# Patient Record
Sex: Male | Born: 1940 | Race: White | Hispanic: No | Marital: Married | Smoking: Former smoker
Health system: Southern US, Community
[De-identification: ages and names within clinical notes are randomized; demographics above are authoritative.]

## PROBLEM LIST (undated history)

## (undated) DIAGNOSIS — I639 Cerebral infarction, unspecified: Secondary | ICD-10-CM

## (undated) DIAGNOSIS — E785 Hyperlipidemia, unspecified: Secondary | ICD-10-CM

## (undated) DIAGNOSIS — I10 Essential (primary) hypertension: Secondary | ICD-10-CM

## (undated) DIAGNOSIS — D510 Vitamin B12 deficiency anemia due to intrinsic factor deficiency: Secondary | ICD-10-CM

## (undated) DIAGNOSIS — I519 Heart disease, unspecified: Secondary | ICD-10-CM

## (undated) DIAGNOSIS — I219 Acute myocardial infarction, unspecified: Secondary | ICD-10-CM

## (undated) DIAGNOSIS — C4490 Unspecified malignant neoplasm of skin, unspecified: Secondary | ICD-10-CM

## (undated) DIAGNOSIS — IMO0001 Reserved for inherently not codable concepts without codable children: Secondary | ICD-10-CM

## (undated) DIAGNOSIS — C61 Malignant neoplasm of prostate: Secondary | ICD-10-CM

## (undated) DIAGNOSIS — Z87442 Personal history of urinary calculi: Secondary | ICD-10-CM

## (undated) DIAGNOSIS — Z5189 Encounter for other specified aftercare: Secondary | ICD-10-CM

## (undated) DIAGNOSIS — E0590 Thyrotoxicosis, unspecified without thyrotoxic crisis or storm: Secondary | ICD-10-CM

## (undated) DIAGNOSIS — H3530 Unspecified macular degeneration: Secondary | ICD-10-CM

## (undated) DIAGNOSIS — K922 Gastrointestinal hemorrhage, unspecified: Secondary | ICD-10-CM

## (undated) DIAGNOSIS — Z951 Presence of aortocoronary bypass graft: Secondary | ICD-10-CM

## (undated) DIAGNOSIS — M1990 Unspecified osteoarthritis, unspecified site: Secondary | ICD-10-CM

## (undated) HISTORY — PX: KNEE ARTHROSCOPY: SHX127

## (undated) HISTORY — DX: Unspecified malignant neoplasm of skin, unspecified: C44.90

## (undated) HISTORY — DX: Unspecified macular degeneration: H35.30

## (undated) HISTORY — DX: Heart disease, unspecified: I51.9

## (undated) HISTORY — PX: GASTRIC RESECTION: SHX5248

## (undated) HISTORY — PX: OTHER SURGICAL HISTORY: SHX169

---

## 1985-08-31 DIAGNOSIS — K922 Gastrointestinal hemorrhage, unspecified: Secondary | ICD-10-CM

## 1985-08-31 HISTORY — DX: Gastrointestinal hemorrhage, unspecified: K92.2

## 2006-02-28 HISTORY — PX: CORONARY ARTERY BYPASS GRAFT: SHX141

## 2006-03-29 ENCOUNTER — Inpatient Hospital Stay (HOSPITAL_COMMUNITY): Admission: EM | Admit: 2006-03-29 | Discharge: 2006-04-05 | Payer: Self-pay | Admitting: Emergency Medicine

## 2006-03-29 ENCOUNTER — Encounter: Payer: Self-pay | Admitting: Vascular Surgery

## 2006-03-31 DIAGNOSIS — I219 Acute myocardial infarction, unspecified: Secondary | ICD-10-CM

## 2006-03-31 HISTORY — DX: Acute myocardial infarction, unspecified: I21.9

## 2006-04-22 ENCOUNTER — Encounter: Admission: RE | Admit: 2006-04-22 | Discharge: 2006-04-22 | Payer: Self-pay | Admitting: Cardiothoracic Surgery

## 2006-04-29 ENCOUNTER — Encounter (HOSPITAL_COMMUNITY): Admission: RE | Admit: 2006-04-29 | Discharge: 2006-07-28 | Payer: Self-pay | Admitting: *Deleted

## 2006-06-14 ENCOUNTER — Ambulatory Visit: Payer: Self-pay | Admitting: Internal Medicine

## 2006-07-01 LAB — CBC WITH DIFFERENTIAL/PLATELET
BASO%: 0.3 % (ref 0.0–2.0)
Eosinophils Absolute: 0.6 10*3/uL — ABNORMAL HIGH (ref 0.0–0.5)
HCT: 32.8 % — ABNORMAL LOW (ref 38.7–49.9)
LYMPH%: 17.5 % (ref 14.0–48.0)
MCHC: 33.7 g/dL (ref 32.0–35.9)
MONO#: 0.3 10*3/uL (ref 0.1–0.9)
NEUT%: 58.1 % (ref 40.0–75.0)
Platelets: 286 10*3/uL (ref 145–400)
WBC: 3.7 10*3/uL — ABNORMAL LOW (ref 4.0–10.0)

## 2006-07-02 ENCOUNTER — Encounter (INDEPENDENT_AMBULATORY_CARE_PROVIDER_SITE_OTHER): Payer: Self-pay | Admitting: Specialist

## 2006-07-02 ENCOUNTER — Other Ambulatory Visit: Admission: RE | Admit: 2006-07-02 | Discharge: 2006-07-02 | Payer: Self-pay | Admitting: Internal Medicine

## 2006-07-05 LAB — PROTEIN ELECTROPHORESIS, SERUM
Albumin ELP: 60 % (ref 55.8–66.1)
Alpha-1-Globulin: 3.8 % (ref 2.9–4.9)
Total Protein, Serum Electrophoresis: 7.1 g/dL (ref 6.0–8.3)

## 2006-07-05 LAB — COMPREHENSIVE METABOLIC PANEL
CO2: 25 mEq/L (ref 19–32)
Creatinine, Ser: 1.13 mg/dL (ref 0.40–1.50)
Glucose, Bld: 86 mg/dL (ref 70–99)
Total Bilirubin: 0.5 mg/dL (ref 0.3–1.2)

## 2006-07-05 LAB — IRON AND TIBC
TIBC: 240 ug/dL (ref 215–435)
UIBC: 163 ug/dL

## 2006-07-05 LAB — LACTATE DEHYDROGENASE: LDH: 183 U/L (ref 94–250)

## 2006-07-05 LAB — FERRITIN: Ferritin: 73 ng/mL (ref 22–322)

## 2006-07-05 LAB — FOLATE RBC: RBC Folate: 640 ng/mL — ABNORMAL HIGH (ref 180–600)

## 2006-07-20 LAB — CBC WITH DIFFERENTIAL/PLATELET
BASO%: 0.4 % (ref 0.0–2.0)
MCHC: 34.7 g/dL (ref 32.0–35.9)
MONO#: 0.4 10*3/uL (ref 0.1–0.9)
NEUT#: 1.5 10*3/uL (ref 1.5–6.5)
RBC: 2.78 10*6/uL — ABNORMAL LOW (ref 4.20–5.71)
RDW: 15.6 % — ABNORMAL HIGH (ref 11.2–14.6)
WBC: 3.1 10*3/uL — ABNORMAL LOW (ref 4.0–10.0)
lymph#: 0.7 10*3/uL — ABNORMAL LOW (ref 0.9–3.3)

## 2008-03-23 ENCOUNTER — Emergency Department (HOSPITAL_COMMUNITY): Admission: EM | Admit: 2008-03-23 | Discharge: 2008-03-23 | Payer: Self-pay | Admitting: Emergency Medicine

## 2009-12-26 ENCOUNTER — Ambulatory Visit (HOSPITAL_COMMUNITY): Admission: RE | Admit: 2009-12-26 | Discharge: 2009-12-26 | Payer: Self-pay | Admitting: Urology

## 2010-09-12 ENCOUNTER — Ambulatory Visit: Payer: Self-pay | Admitting: Hematology & Oncology

## 2010-09-15 LAB — CBC WITH DIFFERENTIAL (CANCER CENTER ONLY)
BASO#: 0 10*3/uL (ref 0.0–0.2)
BASO%: 0.4 % (ref 0.0–2.0)
EOS%: 23.3 % — ABNORMAL HIGH (ref 0.0–7.0)
Eosinophils Absolute: 1.3 10*3/uL — ABNORMAL HIGH (ref 0.0–0.5)
HCT: 38.9 % (ref 38.7–49.9)
HGB: 13.1 g/dL (ref 13.0–17.1)
LYMPH#: 1.3 10*3/uL (ref 0.9–3.3)
LYMPH%: 22 % (ref 14.0–48.0)
MCH: 33.6 pg — ABNORMAL HIGH (ref 28.0–33.4)
MCHC: 33.6 g/dL (ref 32.0–35.9)
MCV: 100 fL — ABNORMAL HIGH (ref 82–98)
MONO#: 0.4 10*3/uL (ref 0.1–0.9)
MONO%: 6.8 % (ref 0.0–13.0)
NEUT#: 2.7 10*3/uL (ref 1.5–6.5)
NEUT%: 47.5 % (ref 40.0–80.0)
Platelets: 244 10*3/uL (ref 145–400)
RBC: 3.88 10*6/uL — ABNORMAL LOW (ref 4.20–5.70)
RDW: 10.9 % (ref 10.5–14.6)
WBC: 5.7 10*3/uL (ref 4.0–10.0)

## 2010-09-15 LAB — CHCC SATELLITE - SMEAR

## 2010-09-17 LAB — RETICULOCYTES (CHCC)
ABS Retic: 46.9 10*3/uL (ref 19.0–186.0)
RBC.: 3.91 MIL/uL — ABNORMAL LOW (ref 4.22–5.81)
Retic Ct Pct: 1.2 % (ref 0.4–3.1)

## 2010-09-17 LAB — PROTEIN ELECTROPHORESIS, SERUM
Albumin ELP: 59.1 % (ref 55.8–66.1)
Alpha-1-Globulin: 3.8 % (ref 2.9–4.9)
Alpha-2-Globulin: 10.4 % (ref 7.1–11.8)
Beta 2: 4.7 % (ref 3.2–6.5)
Beta Globulin: 5.5 % (ref 4.7–7.2)
Gamma Globulin: 16.5 % (ref 11.1–18.8)
Total Protein, Serum Electrophoresis: 7.4 g/dL (ref 6.0–8.3)

## 2010-09-17 LAB — SEDIMENTATION RATE: Sed Rate: 8 mm/hr (ref 0–16)

## 2010-09-17 LAB — LACTATE DEHYDROGENASE: LDH: 136 U/L (ref 94–250)

## 2010-09-17 LAB — ANA: Anti Nuclear Antibody(ANA): NEGATIVE

## 2010-09-17 LAB — RHEUMATOID FACTOR: Rhuematoid fact SerPl-aCnc: <10 IU/mL (ref <=14)

## 2010-10-20 ENCOUNTER — Other Ambulatory Visit (HOSPITAL_COMMUNITY): Payer: Self-pay | Admitting: Urology

## 2010-10-20 DIAGNOSIS — N281 Cyst of kidney, acquired: Secondary | ICD-10-CM

## 2010-10-20 DIAGNOSIS — K862 Cyst of pancreas: Secondary | ICD-10-CM

## 2010-10-29 ENCOUNTER — Ambulatory Visit (HOSPITAL_COMMUNITY)
Admission: RE | Admit: 2010-10-29 | Discharge: 2010-10-29 | Disposition: A | Discharge status: Home / Self Care | Payer: Medicare Other | Source: Ambulatory Visit | Attending: Urology | Admitting: Urology

## 2010-10-29 DIAGNOSIS — K449 Diaphragmatic hernia without obstruction or gangrene: Secondary | ICD-10-CM | POA: Insufficient documentation

## 2010-10-29 DIAGNOSIS — N281 Cyst of kidney, acquired: Secondary | ICD-10-CM

## 2010-10-29 DIAGNOSIS — R911 Solitary pulmonary nodule: Secondary | ICD-10-CM | POA: Insufficient documentation

## 2010-10-29 DIAGNOSIS — K7689 Other specified diseases of liver: Secondary | ICD-10-CM | POA: Insufficient documentation

## 2010-10-29 DIAGNOSIS — Q619 Cystic kidney disease, unspecified: Secondary | ICD-10-CM | POA: Insufficient documentation

## 2010-10-29 DIAGNOSIS — K862 Cyst of pancreas: Secondary | ICD-10-CM | POA: Insufficient documentation

## 2010-10-29 MED ORDER — GADOBENATE DIMEGLUMINE 529 MG/ML IV SOLN
15.0000 mL | Freq: Once | INTRAVENOUS | Status: AC | PRN
  Administered 2010-10-29: 15 mL via INTRAVENOUS

## 2010-12-03 ENCOUNTER — Other Ambulatory Visit: Payer: Self-pay | Admitting: Hematology & Oncology

## 2010-12-03 ENCOUNTER — Encounter (HOSPITAL_BASED_OUTPATIENT_CLINIC_OR_DEPARTMENT_OTHER): Payer: Medicare Other | Admitting: Hematology & Oncology

## 2010-12-03 DIAGNOSIS — C61 Malignant neoplasm of prostate: Secondary | ICD-10-CM

## 2010-12-03 DIAGNOSIS — D721 Eosinophilia, unspecified: Secondary | ICD-10-CM

## 2010-12-03 DIAGNOSIS — K520 Gastroenteritis and colitis due to radiation: Secondary | ICD-10-CM

## 2010-12-03 DIAGNOSIS — R599 Enlarged lymph nodes, unspecified: Secondary | ICD-10-CM

## 2010-12-03 DIAGNOSIS — R591 Generalized enlarged lymph nodes: Secondary | ICD-10-CM

## 2010-12-03 LAB — MORPHOLOGY - CHCC SATELLITE: PLT EST ~~LOC~~: ADEQUATE

## 2010-12-03 LAB — COMPREHENSIVE METABOLIC PANEL
ALT: 12 U/L (ref 0–53)
AST: 23 U/L (ref 0–37)
Alkaline Phosphatase: 68 U/L (ref 39–117)
BUN: 28 mg/dL — ABNORMAL HIGH (ref 6–23)
CO2: 22 mEq/L (ref 19–32)
Calcium: 9.5 mg/dL (ref 8.4–10.5)
Chloride: 106 mEq/L (ref 96–112)
Sodium: 138 mEq/L (ref 135–145)
Total Bilirubin: 0.5 mg/dL (ref 0.3–1.2)

## 2010-12-03 LAB — CBC WITH DIFFERENTIAL (CANCER CENTER ONLY)
BASO#: 0 10*3/uL (ref 0.0–0.2)
EOS%: 23.3 % — ABNORMAL HIGH (ref 0.0–7.0)
Eosinophils Absolute: 1.2 10*3/uL — ABNORMAL HIGH (ref 0.0–0.5)
MCH: 33.7 pg — ABNORMAL HIGH (ref 28.0–33.4)
NEUT%: 44.4 % (ref 40.0–80.0)
Platelets: 241 10*3/uL (ref 145–400)
RBC: 3.68 10*6/uL — ABNORMAL LOW (ref 4.20–5.70)
WBC: 5.2 10*3/uL (ref 4.0–10.0)

## 2010-12-10 ENCOUNTER — Ambulatory Visit (HOSPITAL_BASED_OUTPATIENT_CLINIC_OR_DEPARTMENT_OTHER)
Admission: RE | Admit: 2010-12-10 | Discharge: 2010-12-10 | Disposition: A | Discharge status: Home / Self Care | Payer: Medicare Other | Source: Ambulatory Visit | Attending: Hematology & Oncology | Admitting: Hematology & Oncology

## 2010-12-10 DIAGNOSIS — K449 Diaphragmatic hernia without obstruction or gangrene: Secondary | ICD-10-CM | POA: Insufficient documentation

## 2010-12-10 DIAGNOSIS — I10 Essential (primary) hypertension: Secondary | ICD-10-CM | POA: Insufficient documentation

## 2010-12-10 DIAGNOSIS — J984 Other disorders of lung: Secondary | ICD-10-CM | POA: Insufficient documentation

## 2010-12-10 DIAGNOSIS — R591 Generalized enlarged lymph nodes: Secondary | ICD-10-CM

## 2010-12-10 DIAGNOSIS — R599 Enlarged lymph nodes, unspecified: Secondary | ICD-10-CM | POA: Insufficient documentation

## 2010-12-10 MED ORDER — IOHEXOL 300 MG/ML  SOLN
64.0000 mL | Freq: Once | INTRAMUSCULAR | Status: AC | PRN
  Administered 2010-12-10: 64 mL via INTRAVENOUS

## 2010-12-12 ENCOUNTER — Other Ambulatory Visit: Payer: Self-pay | Admitting: Hematology & Oncology

## 2010-12-19 ENCOUNTER — Encounter (HOSPITAL_COMMUNITY): Payer: Self-pay

## 2010-12-19 ENCOUNTER — Encounter (HOSPITAL_COMMUNITY)
Admission: RE | Admit: 2010-12-19 | Discharge: 2010-12-19 | Disposition: A | Discharge status: Home / Self Care | Payer: Medicare Other | Source: Ambulatory Visit | Attending: Hematology & Oncology | Admitting: Hematology & Oncology

## 2010-12-19 ENCOUNTER — Other Ambulatory Visit: Payer: Self-pay | Admitting: Hematology & Oncology

## 2010-12-19 DIAGNOSIS — J984 Other disorders of lung: Secondary | ICD-10-CM | POA: Insufficient documentation

## 2010-12-19 DIAGNOSIS — Z8546 Personal history of malignant neoplasm of prostate: Secondary | ICD-10-CM | POA: Insufficient documentation

## 2010-12-19 DIAGNOSIS — R918 Other nonspecific abnormal finding of lung field: Secondary | ICD-10-CM

## 2010-12-19 DIAGNOSIS — N289 Disorder of kidney and ureter, unspecified: Secondary | ICD-10-CM | POA: Insufficient documentation

## 2010-12-19 HISTORY — DX: Malignant neoplasm of prostate: C61

## 2010-12-19 LAB — GLUCOSE, CAPILLARY: Glucose-Capillary: 96 mg/dL (ref 70–99)

## 2010-12-19 MED ORDER — FLUDEOXYGLUCOSE F - 18 (FDG) INJECTION
15.6000 | Freq: Once | INTRAVENOUS | Status: AC | PRN
  Administered 2010-12-19: 15.6 via INTRAVENOUS

## 2010-12-30 ENCOUNTER — Other Ambulatory Visit: Payer: Self-pay | Admitting: Hematology & Oncology

## 2010-12-30 ENCOUNTER — Encounter (HOSPITAL_BASED_OUTPATIENT_CLINIC_OR_DEPARTMENT_OTHER): Payer: Medicare Other | Admitting: Hematology & Oncology

## 2010-12-30 DIAGNOSIS — R911 Solitary pulmonary nodule: Secondary | ICD-10-CM

## 2010-12-30 DIAGNOSIS — Z8546 Personal history of malignant neoplasm of prostate: Secondary | ICD-10-CM

## 2010-12-30 DIAGNOSIS — E785 Hyperlipidemia, unspecified: Secondary | ICD-10-CM

## 2010-12-30 DIAGNOSIS — J984 Other disorders of lung: Secondary | ICD-10-CM

## 2010-12-30 DIAGNOSIS — D721 Eosinophilia, unspecified: Secondary | ICD-10-CM

## 2010-12-30 DIAGNOSIS — D61818 Other pancytopenia: Secondary | ICD-10-CM

## 2010-12-30 DIAGNOSIS — Z87891 Personal history of nicotine dependence: Secondary | ICD-10-CM

## 2010-12-30 DIAGNOSIS — C61 Malignant neoplasm of prostate: Secondary | ICD-10-CM

## 2010-12-30 LAB — CBC WITH DIFFERENTIAL (CANCER CENTER ONLY)
BASO#: 0 10*3/uL (ref 0.0–0.2)
Eosinophils Absolute: 1.1 10*3/uL — ABNORMAL HIGH (ref 0.0–0.5)
HCT: 35.5 % — ABNORMAL LOW (ref 38.7–49.9)
HGB: 12.1 g/dL — ABNORMAL LOW (ref 13.0–17.1)
MCH: 33.7 pg — ABNORMAL HIGH (ref 28.0–33.4)
MCV: 99 fL — ABNORMAL HIGH (ref 82–98)
NEUT%: 46.3 % (ref 40.0–80.0)
Platelets: 206 10*3/uL (ref 145–400)
RBC: 3.59 10*6/uL — ABNORMAL LOW (ref 4.20–5.70)
RDW: 12.8 % (ref 11.1–15.7)

## 2011-01-01 LAB — COMPREHENSIVE METABOLIC PANEL
AST: 21 U/L (ref 0–37)
Albumin: 4.4 g/dL (ref 3.5–5.2)
BUN: 22 mg/dL (ref 6–23)
Calcium: 9.3 mg/dL (ref 8.4–10.5)
Chloride: 107 mEq/L (ref 96–112)
Glucose, Bld: 97 mg/dL (ref 70–99)
Potassium: 4.8 mEq/L (ref 3.5–5.3)
Sodium: 138 mEq/L (ref 135–145)
Total Protein: 6.9 g/dL (ref 6.0–8.3)

## 2011-01-01 LAB — SPEP & IFE WITH QIG
Alpha-1-Globulin: 3.5 % (ref 2.9–4.9)
Alpha-2-Globulin: 10 % (ref 7.1–11.8)
Beta 2: 4.5 % (ref 3.2–6.5)
Gamma Globulin: 16.4 % (ref 11.1–18.8)

## 2011-01-16 ENCOUNTER — Inpatient Hospital Stay (HOSPITAL_COMMUNITY)
Admission: EM | Admit: 2011-01-16 | Discharge: 2011-01-18 | DRG: 066 | Disposition: A | Discharge status: Home / Self Care | Payer: Medicare Other | Attending: Emergency Medicine | Admitting: Emergency Medicine

## 2011-01-16 ENCOUNTER — Inpatient Hospital Stay (INDEPENDENT_AMBULATORY_CARE_PROVIDER_SITE_OTHER)
Admission: RE | Admit: 2011-01-16 | Discharge: 2011-01-16 | Disposition: A | Discharge status: Home / Self Care | Payer: Medicare Other | Source: Ambulatory Visit | Attending: Family Medicine | Admitting: Family Medicine

## 2011-01-16 ENCOUNTER — Inpatient Hospital Stay (HOSPITAL_COMMUNITY): Payer: Medicare Other

## 2011-01-16 ENCOUNTER — Emergency Department (HOSPITAL_COMMUNITY): Payer: Medicare Other

## 2011-01-16 ENCOUNTER — Encounter (HOSPITAL_COMMUNITY): Payer: Self-pay | Admitting: Radiology

## 2011-01-16 DIAGNOSIS — M629 Disorder of muscle, unspecified: Secondary | ICD-10-CM

## 2011-01-16 DIAGNOSIS — E785 Hyperlipidemia, unspecified: Secondary | ICD-10-CM | POA: Diagnosis present

## 2011-01-16 DIAGNOSIS — I6350 Cerebral infarction due to unspecified occlusion or stenosis of unspecified cerebral artery: Principal | ICD-10-CM | POA: Diagnosis present

## 2011-01-16 DIAGNOSIS — D649 Anemia, unspecified: Secondary | ICD-10-CM | POA: Diagnosis present

## 2011-01-16 DIAGNOSIS — I252 Old myocardial infarction: Secondary | ICD-10-CM

## 2011-01-16 DIAGNOSIS — Z8546 Personal history of malignant neoplasm of prostate: Secondary | ICD-10-CM

## 2011-01-16 DIAGNOSIS — I10 Essential (primary) hypertension: Secondary | ICD-10-CM | POA: Diagnosis present

## 2011-01-16 DIAGNOSIS — I2510 Atherosclerotic heart disease of native coronary artery without angina pectoris: Secondary | ICD-10-CM | POA: Diagnosis present

## 2011-01-16 HISTORY — DX: Acute myocardial infarction, unspecified: I21.9

## 2011-01-16 HISTORY — DX: Presence of aortocoronary bypass graft: Z95.1

## 2011-01-16 HISTORY — DX: Essential (primary) hypertension: I10

## 2011-01-16 HISTORY — DX: Hyperlipidemia, unspecified: E78.5

## 2011-01-16 LAB — CBC
HCT: 33.7 % — ABNORMAL LOW (ref 39.0–52.0)
MCHC: 34.4 g/dL (ref 30.0–36.0)
Platelets: 215 10*3/uL (ref 150–400)
RDW: 13.4 % (ref 11.5–15.5)
WBC: 5.7 10*3/uL (ref 4.0–10.5)

## 2011-01-16 LAB — COMPREHENSIVE METABOLIC PANEL
BUN: 21 mg/dL (ref 6–23)
CO2: 25 mEq/L (ref 19–32)
Calcium: 9.1 mg/dL (ref 8.4–10.5)
Creatinine, Ser: 1.05 mg/dL (ref 0.4–1.5)
GFR calc non Af Amer: >60 mL/min (ref >60)
Glucose, Bld: 101 mg/dL — ABNORMAL HIGH (ref 70–99)
Total Protein: 6.5 g/dL (ref 6.0–8.3)

## 2011-01-16 LAB — POCT CARDIAC MARKERS
CKMB, poc: <1 ng/mL — ABNORMAL LOW (ref 1.0–8.0)
Myoglobin, poc: 82.3 ng/mL (ref 12–200)

## 2011-01-16 LAB — PROTIME-INR: Prothrombin Time: 13.9 seconds (ref 11.6–15.2)

## 2011-01-16 LAB — BASIC METABOLIC PANEL
CO2: 25 mEq/L (ref 19–32)
Calcium: 9.5 mg/dL (ref 8.4–10.5)
GFR calc Af Amer: >60 mL/min (ref >60)
GFR calc non Af Amer: >60 mL/min (ref >60)
Glucose, Bld: 103 mg/dL — ABNORMAL HIGH (ref 70–99)
Potassium: 3.8 mEq/L (ref 3.5–5.1)
Sodium: 139 mEq/L (ref 135–145)

## 2011-01-16 LAB — CK TOTAL AND CKMB (NOT AT ARMC)
CK, MB: 4.2 ng/mL — ABNORMAL HIGH (ref 0.3–4.0)
Relative Index: 2.3 (ref 0.0–2.5)
Total CK: 186 U/L (ref 7–232)

## 2011-01-16 LAB — URINALYSIS, ROUTINE W REFLEX MICROSCOPIC
Hgb urine dipstick: NEGATIVE
Nitrite: NEGATIVE
Specific Gravity, Urine: 1.021 (ref 1.005–1.030)
Urobilinogen, UA: 1 mg/dL (ref 0.0–1.0)
pH: 6.5 (ref 5.0–8.0)

## 2011-01-16 LAB — DIFFERENTIAL
Eosinophils Absolute: 0.9 10*3/uL — ABNORMAL HIGH (ref 0.0–0.7)
Lymphs Abs: 1.6 10*3/uL (ref 0.7–4.0)
Monocytes Relative: 11 % (ref 3–12)
Neutrophils Relative %: 45 % (ref 43–77)

## 2011-01-16 NOTE — H&P (Signed)
NAMEKACEN, FOSSEN NO.:  [id number]   MEDICAL RECORD NO.:  [id number]          PATIENT TYPE:  INP   LOCATION:  [id number]                         FACILITY:  [facility]   PHYSICIAN:  Octavia Heir, MD  DATE OF BIRTH:  1940/12/27   DATE OF ADMISSION:  03/29/2006  DATE OF DISCHARGE:                                HISTORY & PHYSICAL   CHIEF COMPLAINT:  Chest pain.   HISTORY OF PRESENT ILLNESS:  A 70 year old white married male with cardiac  history of chest pain off and on for several weeks.  He had been given an  appointment by Dr. Mable Fill, primary care, to see Dr. Einar Gip on August  2007.  Unfortunately, yesterday, he had chest pain off and on, described it  more as indigestion.  Then around 5 a.m. he had sharp pain with a cold  sweat, shortness of breath, nausea but no vomiting.  It reoccurred around 6,  presented to the emergency room with acute inferior MI and with ST elevation  and was brought to the cath lab for emergency cath and hopefully rescue  PTCA.   ALLERGIES:  NO KNOWN DRUG ALLERGIES.   OUTPATIENT MEDICATIONS:  1. Azathioprine 50 mg, he takes all five tablets at the same time.  2. Folic acid 1 mg daily.  3. Sulfasalazine 500 mg four times a day.  4. Hydrochlorothiazide 12.5 mg daily.   PAST MEDICAL HISTORY:  1. No cardiac history previously, though family history.  2. Hypertension.  3. Ulcerative colitis was diagnosed in July 2004 but no recent issues.  4. History of pernicious anemia recently diagnosed, he just got seven B12      shots in a row on a daily basis and now set up for monthly B12.  5. Recent H. pylori, just completed Prevpac.  6. History of acute bleed, gastric bleed 21 years ago with stomach surgery      emergently.  7. History of prostate cancer with seed implants.  PSA has been within      normal limits recently.  8. Both knees with metal plates and right wrist with metal plate. He is      unable to bend the right wrist.  9.  He has elevated cholecystectomy, most recent LDL in June was 130.   FAMILY HISTORY:  Mother died at 37 with ovarian cancer.  Father died at 60  in a house fire.  One brother is living at 20 with history of coronary  disease.  One sister died about six months ago, an older sister with  coronary disease.   SOCIAL HISTORY:  Married.  He has two step-sons, two step-grandchildren with  another expected soon.  Remote tobacco history 30 years ago and only cigars.  Occasionally he has dipped snuff in the past but none of that recently.  No  alcohol.  He is retired.  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  REVIEW OF SYSTEMS:  GENERAL:  No weight changes, no colds or fevers.  SKIN:  No rashes.  HEENT:  No sinus infections.  GI:  See past medical history, no  diarrhea, constipation or melena now.  GU:  Up and down during the night  with urinary urgency.  ENDOCRINE:  No diabetes or thyroid disease.  NEUROLOGIC:  No syncope, no dizziness.  CARDIOVASCULAR:  See HPI.  PULMONARY:  See past medical history and HPI.  No significant shortness of  breath.  MUSCULOSKELETAL:  He cannot bend his right wrist due to steel  plates.   PHYSICAL EXAMINATION:  GENERAL APPEARANCE:  Alert and oriented x3, moves all  extremities.  VITAL SIGNS:  Currently waiting for the ER record, but for now, blood  pressure 123/85, pulse 60.  Patient is stable.  SKIN:  Warm and dry.  HEENT:  Sclerae are clear.  NECK:  Supple.  No JVD, no bruits.  LUNGS:  Fairly clear anteriorly.  CARDIOVASCULAR:  Regular rate and rhythm.  I do not hear any murmurs, rubs,  gallops, or clicks.  S1 and S2.  ABDOMEN:  Soft, nontender with positive bowel sounds.  EXTREMITIES:  Without edema.  Could not, at this point, palpate pedals.  I  am waiting for further exam.  NEUROLOGIC:  Alert and oriented x3.   Follows commands.   LABORATORY DATA:  Hemoglobin 1.7, hematocrit 35, platelets 242, white count  5.6.  Protime 13.7, INR 1, PTT 28.  Sodium 139, potassium 3.5, BUN 18,  creatinine 1.1, glucose 140.   IMPRESSION:  1. Acute inferior myocardial infarction, total right coronary artery,      unable to cross, probably old per Dr. Tami Ribas.  Left main disease as      well as 99% left anterior descending.  2. Hyperlipidemia.  3. History of prostate cancer.  4. Pernicious anemia.  5. History of hypertension.   PLAN:  Emergent catheterization.  Admit to CCU.  Do serial CK-MBs.  Replace  potassium at 3.5 and CVTS consult has been called.      Otilio Carpen. Richarda Overlie      Octavia Heir, MD  Electronically Signed    LRI/MEDQ  D:  03/29/2006  T:  03/29/2006  Job:  [id number]   cc:   Tamsen Roers

## 2011-01-16 NOTE — Op Note (Signed)
Aaron, Whitaker NO.:  [id number]   MEDICAL RECORD NO.:  [id number]          PATIENT TYPE:  INP   LOCATION:  2304                         FACILITY:  [facility]   PHYSICIAN:  Lanelle Bal, MD    DATE OF BIRTH:  04-08-1941   DATE OF PROCEDURE:  03/30/2006  DATE OF DISCHARGE:                                 OPERATIVE REPORT   PREOPERATIVE DIAGNOSES:  Coronary occlusive disease with unstable angina.   POSTOPERATIVE DIAGNOSES:  Coronary occlusive disease with unstable angina.   SURGICAL PROCEDURE:  Coronary artery bypass grafting times five with left  internal mammary artery to the left anterior descending coronary artery,  sequential reversed saphenous vein graft to the first and second obtuse  marginal, sequential reversed saphenous vein graft to the posterior  descending and posterolateral branches of the right coronary artery with  right leg endovein harvesting.   SURGEON:  Lanelle Bal, M.D.   FIRST ASSISTANT:  Theodoro Kalata, P.A.   BRIEF HISTORY:  The patient is a 70 year old male with known ulcerative  colitis, who presented with several-week history of progressive unstable  anginal symptoms.  He was admitted with episode of prolonged chest pain that  was improved with Integrilin, heparin and IV nitroglycerin.  The patient  became pain-free and underwent cardiac catheterization.  His troponins were  elevated at 0.95, CK 108, MB 9.6.  At the time of cardiac catheterization,  significant three-vessel disease was found, including total occlusion of his  right coronary artery, filling from collaterals, with high-grade stenosis in  the proximal, PD and PL branches.  He had distal left main obstruction of at  least 50%.  The LAD was severely stenotic in the midportion, greater than  90%.  He had 70-80% stenoses of the first and second obtuse marginal.  Ejection fraction was depressed to 30-35% with inferior hypokinesis.  Coronary artery bypass grafting was  recommended to the patient, who agreed  and signed informed consent.   DESCRIPTION OF PROCEDURE:  With Swan-Ganz and arterial line monitors placed,  the patient underwent general endotracheal anesthesia without incident.  The  skin of the chest and legs was prepped with Betadine and draped in the usual  sterile manner.  Using the [employer] endovein harvesting system, vein was  harvested from the right thigh and was of good quality and caliber.  Median  sternotomy was performed and a left internal mammary artery was dissected  down as a pedicle graft.  The distal artery was divided and had good free  flow.  The vessel was hydrostatically dilated with heparinized saline.  The  pericardium was opened.  The patient had some inferior scarring, high on the  inferior wall.  He had good motion of his inferior, anterior and lateral  walls.  He had significant calcification of his ascending aorta.  The  cannulation site was moved up to an area free of calcification, just distal  to the take-off of the innominate.  The area just above the very proximal  aorta, just above the iliac valve, was free of calcium and this is where the  proximals were performed.  The patient was systemically heparinized.  The  cannulas were placed.  The patient was placed on cardiopulmonary bypass, 2.4  liters per minute per meter square.  Aortic root vent cardioplegia needle  was introduced into the ascending aorta.  The patient was placed on  cardiopulmonary bypass at 2.4 liters per minute per meter square.  Sites for  anastomosis were selected, dissected out of the epicardium.  The patient's  body temperature was cooled to 30 degrees.  Aortic cross-clamp was applied  and 500 mL cold blood potassium cardioplegia was administered with rapid  diastolic arrest of the heart.  Myocardial septal temperature was monitored  throughout the cross-clamp period.   Attention was turned first to the OM1 vessel, which was grossly   intramyocardial.  The vessel was opened and admitted a 1 mm probe distally,  using running 7-0 Prolene.  Side-to-side anastomosis was performed.  The  distal extent of the same vein was then trimmed and anastomosed to the  second obtuse marginal, which was slightly larger, admitting a 1.5 mm probe.  Using a running 7-0 Prolene, distal anastomosis was performed.   Attention was then turned to the posterior descending and posterolateral  branches.  Both were diseased proximally in the midportion of each vessel.  The posterior descending was opened.  Using a short natural Y, an end-to-  side anastomosis was carried out.  The distal extent of the same vein was  then carried to a larger posterolateral branch and a running 7-0 Prolene was  used to perform a distal anastomosis.  Intermittent cold blood cardioplegia  was administered down the vein grafts.   Attention was then turned to the left anterior descending coronary artery,  which was opened in the midportion.  A 1.5 mm probe was passed distally.  Using a running 8-0 Prolene, the left internal mammary artery was  anastomosed to the left anterior descending coronary artery.  With release  of the Edwards bulldog on the mammary artery, there was no anastomotic  bleeding and prompt increase in temperature.  The bulldog was placed back on  the mammary artery.  Additional cold blood cardioplegia was administered.  With the cross-clamp still in place, 2 arteriotomies were performed on the  ascending aorta proximally, away from the area of calcification.  Each of  the two vein grafts was anastomosed to the ascending aorta with running 7-0  Prolenes.  Air was evacuated from the aorta and the aortic cross-clamp was  removed with a total cross-clamp time of 80 minutes.  When the bulldog was  removed from the mammary artery, there was a prompt rise in myocardial  septal temperature.  The patient spontaneously converted to a sinus rhythm.  Atrial and  ventricular pacing wires were applied.  Graft markers were  applied.  The patient was then ventilated and weaned from cardiopulmonary  bypass without difficulty.  He remained hemodynamically stable.  He was  decannulated in the usual fashion.  Protamine sulfate was administered.  With the operative field hemostatic, two atrial and two ventricular pacing  wires were applied.  The pericardium was loosely reapproximated with a left  pleural tube and a Blake midline mediastinal tube was left in place.  Pericardium was loosely reapproximated.  Sternum was closed with #6  stainless steel wire.  Fascia was closed with interrupted 0 Vicryl and  running 3-0 Vicryl in the subcutaneous tissue, 4-0 subcuticular stitch in  the skin.  Dry dressings were applied.  Sponge and needle count was reported  as correct at the completion of the procedure.  The patient tolerated the  procedure without obvious complication and was transferred to the surgical  intensive care unit for further postoperative care.      Lanelle Bal, MD  Electronically Signed     EG/MEDQ  D:  03/30/2006  T:  03/30/2006  Job:  [id number]   cc:   Octavia Heir, MD  Tamsen Roers

## 2011-01-16 NOTE — Consult Note (Signed)
NAMEPUNEET, DORNBUSCH NO.:  [id number]   MEDICAL RECORD NO.:  [id number]          PATIENT TYPE:  INP   LOCATION:  2031                         FACILITY:  [facility]   PHYSICIAN:  Shaune Pascal. Champey, M.D.DATE OF BIRTH:  06/10/1941   DATE OF CONSULTATION:  DATE OF DISCHARGE:                                   CONSULTATION   REFERRING PHYSICIAN:  Lanelle Bal, MD   REASON FOR CONSULTATION:  TIA/stroke evaluation.   HISTORY OF PRESENT ILLNESS:  Mr. Moriyama is a 70 year old Caucasian male  with multiple medical problems who initially presented with a 3-week history  of severe chest pain, shortness of breath, diaphoresis.  Patient was  admitted.  Underwent catheterization status post CABG x3 on March 30, 2006.  This morning, around 10:00 a.m. patient developed left hand numbness and he  also felt like his lower extremities were weak when he was walking in  physical therapy.  Patient had similar symptoms in his hands in the past few  weeks.  The patient still feels like his left hand his numb and had a slight  weakness in his left leg.  He denies any vision changes, speech changes,  swallowing problems, dizziness, vertigo, falls, or loss of consciousness.   PAST MEDICAL HISTORY:  Positive for ulcerative colitis, prostate cancer,  bilateral knee surgery with plates embedded, and hyperlipidemia.   MEDICATIONS:  Pepcid, folic acid, Lasix, Lopressor, Altace, Zocor,  sulfasalazine, aspirin, digoxin, and Dulcolax.   ALLERGIES:  NO KNOWN DRUG ALLERGIES.   SOCIAL HISTORY:  Patient lives with his wife.  Denies any tobacco or alcohol  use.   FAMILY HISTORY:  Positive for heart disease.   REVIEW OF SYMPTOMS:  Positive per HPI.   REVIEW OF SYSTEMS:  Negative as per HPI and greater than 6 other organ  systems.   PHYSICAL EXAMINATION:  VITAL SIGNS:  Temperature 97.1, pulse 88, blood  pressure 112/69, respirations 28, O2 sat is 93%.  HEENT:  Normocephalic, atraumatic.   Extraocular muscles are intact.  Pupils  are equal.  NECK:  Supple.  No carotid bruits.  HEART:  Regular.  LUNGS:  Clear.  ABDOMEN:  Soft and nontender.  EXTREMITIES:  Good pulses.  NEUROLOGIC:  Patient is awake, alert, and oriented x3.  Language is fluent.  Patient is following commands appropriately.  Cranial nerves II-XII are  grossly intact.  MOTOR:  Shows 4+/5 strength in left lower extremity.  The rest are 5/5  strength.  SENSORY:  Within normal limits to light touch.  Reflexes are trace to 1+  throughout.  Toes are neutral bilaterally.  Gait is subtly wide-based and  needs support.   Heart Doppler showed mild bilateral plaque with no ST stenosis.   LABORATORY DATA:  WBC is 4.6, hemoglobin 8.1, platelets 95,000.  Sodium 139,  potassium 4.1, chloride 108, CO2 28, BUN 20, creatinine 1, glucose 107.  Magnesium  2.5.  Cholesterol 194, LDL 143.   IMPRESSION:  This is a 70 year old Caucasian male status post CABG who  developed left upper extremity numbness and slight left lower extremity  weakness today with possible CVA.  The patient is outside of the window for  aggressive interventions or IV TPA as the symptoms are greater than 3 hours  out.  Also a contraindication with the patient's recent surgery.  Question  whether the patient should get an MRI scan with the metal plates in his  knees.  His wife will inquire if they are compatible with an MRI.  For now,  we will get a CT of the head.  We will take 2-D echocardiogram.  Will check  homocystine level and get PT/OT consults.  Patient should continue on his  daily aspirin.   We will follow the patient on the stroke service.      Shaune Pascal. Estella Husk, M.D.  Electronically Signed     DRC/MEDQ  D:  04/01/2006  T:  04/02/2006  Job:  [id number]

## 2011-01-16 NOTE — Consult Note (Signed)
Aaron Whitaker, SECHLER NO.:  [id number]   MEDICAL RECORD NO.:  [id number]          PATIENT TYPE:  INP   LOCATION:  2910                         FACILITY:  [facility]   PHYSICIAN:  Lanelle Bal, MD    DATE OF BIRTH:  [birth year]   DATE OF CONSULTATION:  03/29/2006  DATE OF DISCHARGE:                                   CONSULTATION   REASON FOR CONSULTATION:  Coronary artery disease with unstable angina.   HISTORY OF PRESENT ILLNESS:  The patient is a 70 year old male with no  previous history of coronary artery disease who presents acutely at 5 a.m.  this morning with two to three-week history of increasing chest discomfort,  nonradiating, associated with shortness of breath and diaphoresis that has  been worsening over the past several days.  He notes the discomfort can be  with exertion but sometimes is without exertion.  Because of increasing  severity and frequency of the episodes, he came to the emergency room this  morning.  CK-MBs are pending.  He was stabilized on IV heparin, Integrilin,  and nitroglycerin and then became pain free.  Cardiac catheterization was  done by Dr. Alla German.  The patient has no prior history of myocardial  infarction or angioplasty.  Cardiac risk factors include hypertension.  His  lipid status is unknown.  He denies diabetes.  He has been a remote smoker  but quit smoking in 1967.  He quit using chewing tobacco three to four  months ago.  He has a positive family history. He has two brothers, one who  has had bypass surgery and one who has had a stent.  He has two sisters, one  who has had angioplasty and one who died of metastatic cancer in the abdomen  of unknown cause.  His father died at age 42 in an accident and had a  previous history of stroke.  His mother died at age 67 of ovarian cancer.   PAST MEDICAL HISTORY:  1. The patient has a history of ulcerative colitis diagnosed in July 2004.      Since that time, he has had  two colonoscopies.  2. History of prostate cancer diagnosed in August 2003, treated with seed      implants done on April 2004.  3. 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  4. Recently, he was noted to have asymptomatic H. pylori and was treated      on a 14-day drug course.   SOCIAL HISTORY:  The patient is married.  He retired in May 2007.  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  Denies  alcohol use.   MEDICATIONS:  1. Azathioprine 50 mg five times a day.  2. Folic acid 1 mg a day.  3. Sulfasalazine 500 mg q.i.d.  4. Hydrochlorothiazide 12.5 mg p.o. daily.   DRUG ALLERGIES:  None known.  Because of his history of GI bleed in the  distant past, he avoids aspirin.   REVIEW OF SYSTEMS:  CARDIAC:  Positive for chest  pain, exertional shortness  of breath, and presyncope.  He denies lower extremity edema, palpitations,  syncope, or orthopnea.  Denies resting shortness of breath.  GENERAL:  The  patient has lost approximately 25 pounds over the past three months.  He  attributes this to change in his diet since retiring.  RESPIRATORY:  Denies  hemoptysis.  GI:  Over the past year, on current medications, he has had no  flareups of his ulcerative colitis and it has been over a year or more since  he has had any difficulties with ulcerative colitis.  GU:  He denies any  hematuria or urinary frequency, nocturia x 1.  Denies any recent infections.  Denies psychiatric history.  Denies any amaurosis or TIAs.  Other review of  systems negative.   PHYSICAL EXAMINATION:  GENERAL:  The patient appears his stated age, in no  acute distress, not having chest pain at the time of exam.  VITAL SIGNS:  Blood pressure 120/85, pulse 60 and sinus rhythm.  Respiratory  rate 18.  O2 sat 96%.  NEUROLOGIC:  The patient is awake and alert and neurologically intact, able  to relate his history without difficulty.  SKIN:  Warm and  dry.  NECK:  He has no jugular venous distention.  Carotid pulses are easily  palpable.  He has no carotid bruits bilaterally.  CARDIAC:  Regular rate and rhythm without murmur or gallop.  ABDOMEN:  Thin abdomen without palpable masses or tenderness.  EXTREMITIES:  Lower extremities are with 2+ DP and PT pulses bilaterally.  He appears to have adequate vein in both lower extremities for bypass.   LABORATORY FINDINGS:  Hematocrit 35, platelets 242, white count 5.6.  Pro  time is 13.7, INR 1.  Sodium 139, potassium 3.5, BUN 18, creatinine 1.1,  glucose 140.   Cardiac catheterization films are reviewed.  The patient has decreased  ejection fraction of 35% to 40% with inferior hypokinesis.  He has 80% to  90% LAD lesion, 70% to 80% circumflex lesion.  The right coronary artery is  totally occluded proximally and there is reconstitution of the distal PD and  PL via collaterals.  The PL has a 90% proximal stenosis.   IMPRESSION:  1. Patient with severe 3-vessel coronary artery disease.  Admission, CK-      MBs and troponins are pending.  The patient has unstable angina with      severe 3-vessel coronary artery disease, now pain free and stabilized.  2. History of ulcerative colitis.  3. History of prostate cancer.  4. History of hypertension.  5. Question history of pernicious anemia.   PLAN:  After reviewing the films and discussion of the patient's symptoms, I  agree with Dr. Tami Ribas to proceed with coronary artery bypass grafting.  The  patient is now pain free in the CCU and will plan to proceed with bypass  surgery on July 31.  Risks and options were discussed with the patient and  his wife and son, including the risks of death, infection, stroke,  myocardial infarction, bleeding, transfusion. The risk of blood transfusion  is specifically discussed with the patient.  He is agreeable with proceeding  with surgery and has had his questions answered.      Lanelle Bal,  MD Electronically Signed     EG/MEDQ  D:  03/29/2006  T:  03/29/2006  Job:  [id number]   cc:   Wray Kearns, MD  Tamsen Roers

## 2011-01-16 NOTE — Discharge Summary (Signed)
NAMEJUNICHI, Aaron Whitaker NO.:  [id number]   MEDICAL RECORD NO.:  [id number]          PATIENT TYPE:  INP   LOCATION:  2031                         FACILITY:  [facility]   PHYSICIAN:  Lanelle Bal, MD    DATE OF BIRTH:  [birth year]   DATE OF ADMISSION:  03/29/2006  DATE OF DISCHARGE:  04/05/2006                                 DISCHARGE SUMMARY   PRIMARY ADMITTING DIAGNOSIS:  Chest pain.   ADDITIONAL/DISCHARGE DIAGNOSES:  1. Coronary artery disease.  2. Unstable angina.  3. Acute inferior myocardial infarction.  4. History of ulcerative colitis.  5. History of prostate cancer, status post seed implants.  6. History of peptic ulcer disease, status post gastric resection.  7. Status post recent treatment for Helicobacter pylori.  8. Postoperative cerebrovascular accident.  9. Hyperlipidemia.  10.Acute blood loss anemia, postoperative with history of pernicious      anemia.   PROCEDURES PERFORMED:  1. Cardiac catheterization.  2. Coronary artery bypass grafting x5 (left internal mammary artery to the      LAD, sequential saphenous vein graft to the first and second obtuse      marginal, sequential saphenous vein graft to posterior descending and      posterolateral branches of the right coronary artery).  3. Endoscopic vein harvest right leg.   HISTORY:  The patient is a 70 year old male who over the several weeks  preceding this admission had experienced intermittent chest pain.  He had  been given an appointment to see Dr. Christen Butter in August 2007; however, on  the day prior to admission, he had chest pain which occurred sharply and was  associated with diaphoresis, shortness of breath and nausea.  He presented  to the emergency department for further evaluation.  He was ruled in for an  acute myocardial infarction and was subsequently admitted for further  cardiac workup.   HOSPITAL COURSE:  He was admitted and taken urgently to the cardiac cath  lab.  As  cardiac catheterization showed severe three-vessel coronary artery  disease with a decreased ejection fraction of 35-40% with inferior  hypokinesis, he was started on Integrilin and treated with aspirin and pain  resolved.  Cardiothoracic surgery consultation was obtained for  consideration of surgical revascularization.  The patient was seen by Dr.  Lanelle Bal and was agreed that he should proceed with surgery at this  time.  He explained the risks, benefits and alternatives of the procedure to  the patient and he agreed to proceed with surgery.  He underwent a complete  preoperative workup which included carotid Doppler studies which showed no  evidence of ICA stenosis bilaterally as well as normal ABIs.  He was taken  to the operating room on March 30, 2006 and underwent capsule x5 by Dr.  Servando Snare as described in detail above.  He tolerated the procedure well and  was transferred to the SICU in stable condition.  He was able to be  extubated shortly after surgery.  He was hemodynamically stable and doing  well on postoperative day #1.  At that time, he was noted to have  mild blood  loss anemia which did require transfusion of a unit of packed red blood  cells.  He remained in the unit for further observation.   By postoperative day #2, he was stable and ready for transfer to the floor.  However, the course of his second postoperative day, he developed left-sided  weakness and gait disturbance.  He was also complaining of some left-sided  numbness.  Neurology consult was obtained and the patient subsequently  underwent a head CT.  This showed evidence of a small right subcortical  infarction.  It was felt that he should continue on aspirin and PT and OT  consults were obtained.  Fortunately, his neurological symptoms have  improved significantly.  He presently is able to ambulate without difficulty  with minimal assistance and his left-sided weakness has improved.  Otherwise, his  postoperative course has been uneventful.  He is tolerating a  regular diet.  His surgical incisions are all healing well.  He is  maintaining normal sinus rhythm, has been afebrile and all vital signs have  been stable throughout his admission.   Most recent labs on April 03, 2006 show a hemoglobin 9.0 which has been  stable, hematocrit 26, white count 3.8, platelets 155.  Sodium 138,  potassium 4.2, BUN 19, creatinine 1.0.  He has been diuresed back down to  1/2 kg above his preoperative weight and shows no evidence of volume  overload on physical exam.  It is felt that if he continues to remain stable  over the next 24 hours he will hopefully be ready for discharge home on  April 05, 2006.   DISCHARGE MEDICATIONS:  1. Aspirin 81 mg daily.  2. Toprol XL 25 mg daily.  3. Altace 2.5 mg daily.  4. Zocor 20 mg q.h.s.  5. Digoxin 0.125 mg daily.  6. Imuran 250 mg daily.  7. Sulfasalazine 500 mg daily.  8. Folic acid 1 mg daily.  9. Iron 325 mg daily.  10.Inspra 25 mg daily.  11.Tylox one to two q.4h. p.r.n. for pain.   DISCHARGE INSTRUCTIONS:  He is asked to refrain from driving, heavy lifting  or strenuous activity.  He may continue ambulating daily and using his  incentive spirometer.  He may shower daily and clean his incisions with soap  and water.   DISCHARGE FOLLOWUP:  He will see Dr. Servando Snare back in the office on April 22, 2006 and should have a chest x-ray at [facility] one hour  prior to this appointment.  He is asked to call a make an  appointment see Dr. Tami Ribas in two weeks.  He will also need to follow up  with Dr. Leonie Man, neurologist, in two months.  He will contact our office in  the interim if he experiences any problems or has questions.  He will also  have home health PT and OT arranged.  for an line.      Suzzanne Cloud, P.A.      Lanelle Bal, MD  Electronically Signed   GC/MEDQ  D:  04/04/2006  T:  04/05/2006  Job:  [id number]   cc:    Octavia Heir, MD  Pramod P. Leonie Man, [place]

## 2011-01-16 NOTE — Op Note (Signed)
Aaron Whitaker, Aaron Whitaker NO.:  [id number]   MEDICAL RECORD NO.:  [id number]          PATIENT TYPE:  INP   LOCATION:  [id number]                         FACILITY:  [facility]   PHYSICIAN:  Octavia Heir, MD  DATE OF BIRTH:  10-18-1940   DATE OF PROCEDURE:  03/29/2006  DATE OF DISCHARGE:                                 OPERATIVE REPORT   PROCEDURES:  1. Left heart catheterization.  2. Coronary angiography.  3. Left ventriculogram.  4. RCA-proximal.  - unsuccessful percutaneous intervention.   SURGEON:  Octavia Heir, MD.   COMPLICATIONS:  None.   INDICATIONS FOR PROCEDURE:  Aaron Whitaker is a 70 year old male patient of  Dr. Tamsen Whitaker with a history of hypertension, remote tobacco use, history  of ulcerative colitis who has had three to four weeks of stuttering chest  pain.  He was scheduled to see [place] in the office in the upcoming week.  On  the morning of March 29, 2006, approximately 5 o'clock in the morning, he  developed recurrent chest pain which lasted 20-30 minutes and then resolved.  He had another episode approximately 6 o'clock in the morning which prompted  his presentation to [employer].  Upon arrival to [place], he was noted to have acute and inferior wall  ischemia.  He is now brought for cardiac catheterization to evaluate his  coronary anatomy.   DESCRIPTION OF PROCEDURE:  After giving informed written consent, patient  brought to the cardiac catheterization lab where right and left groins were  shaved, prepped and draped in the usual sterile fashion.  ECG monitor  established.  Using modified Seldinger technique, a #6 French arterial  sheath inserted in the right femoral artery.  A 6 French diagnostic catheter  was used to perform diagnostic angiography.   The left main is a medium size vessel which is calcified with a 70% ostial  lesion. There is pressure damping with engaged.   The LAD is a  medium size vessel which courses to the apex, there are two  diagonal branches.  LAD is coarsely irregular with 30% proximal, mid 99%  lesion and distal irregularities.  It does give rise to two diagonal  branches.  There are left-to-right collaterals from the LAD as well as  septal perforators.   The first diagonal is a small vessel which is subtotally occluded.  The  second diagonal is a small to medium size vessel with no significant  disease.   Left circumflex is a small vessel which is tortuous and difficult to  visualize.  However, it appears to give rise to one OM.  Circumflex appears  to have approximately 70% proximal lesion.   First OM appears to be a medium size vessel which again is not well seen and  does not appear to have any high grade stenosis.   The right coronary artery is calcified in its proximal portion and totally  occluded.  There are faint right-to-right collaterals to the mid portion of  the RCA.  As previously stated, there are collaterals from the left system  filing the distal RCA as well as the PD and posterolateral branch.  PDA  appears to have a 90% proximal lesion.   Left ventriculogram reveals serially depressed EF at approximately 25% with  moderate to severe global hypokinesis with anterolateral, apical and  inferoapical hypokinesis.  The LV appears to be mildly dilated.   HEMODYNAMIC SYSTEM:  Carotid arterial pressure 111/68. Left ventricular systolic pressure [id number],  __________.   INTERVENTIONAL PROCEDURE:  RCA-proximal:  Following diagnostic angiography, a #6 [place] JR4 guiding  catheter with side holes required to engage the right coronary ostium.  We  used multiple guidewires in attempt to cross the proximal RCA total  occlusion including a Forte, Whisper, PT-2, Choice PT, and a Prowater;  however, none of these were able to  cross the proximal occlusion.  This was  ultimately abandoned.  The patient remained hemodynamically stable with  no  complaints of chest pain at the conclusion of the case.   Final orthogonal angiograms reveal total occlusion of the proximal RCA which  is believed to be chronic.  At this point, all balloons, wires and catheters  were removed.  Hemostatic sheath was sewn in place and the patient went the  CCU in stable condition.   CONCLUSION:  1. Unsuccessful percutaneous transluminal coronary angioplasty of the      proximal right coronary artery.  2. Significant left main and three vessel coronary artery disease.  3. Moderate to severely depressed left ventricular systolic function with      wall motion abnormalities as noted above.      Octavia Heir, MD  Electronically Signed     RHM/MEDQ  D:  03/29/2006  T:  03/29/2006  Job:  [id number]   cc:   Aaron Whitaker

## 2011-01-17 ENCOUNTER — Inpatient Hospital Stay (HOSPITAL_COMMUNITY): Payer: Medicare Other

## 2011-01-17 LAB — CARDIAC PANEL(CRET KIN+CKTOT+MB+TROPI)
CK, MB: 3.8 ng/mL (ref 0.3–4.0)
CK, MB: 3.5 ng/mL (ref 0.3–4.0)
CK, MB: 3 ng/mL (ref 0.3–4.0)
Relative Index: 2.4 (ref 0.0–2.5)
Total CK: 154 U/L (ref 7–232)
Troponin I: <0.3 ng/mL (ref <0.30)

## 2011-01-17 LAB — LIPID PANEL
Cholesterol: 155 mg/dL (ref 0–200)
LDL Cholesterol: 98 mg/dL (ref 0–99)
VLDL: 23 mg/dL (ref 0–40)

## 2011-01-17 LAB — HEMOGLOBIN A1C
Hgb A1c MFr Bld: 5.8 % — ABNORMAL HIGH (ref <5.7)
Mean Plasma Glucose: 120 mg/dL — ABNORMAL HIGH (ref <117)

## 2011-01-17 NOTE — H&P (Signed)
NAMESINJIN, LIU NO.:  [id number]  MEDICAL RECORD NO.:  [id number]           PATIENT TYPE:  I  LOCATION:  3037                         FACILITY:  [facility]  PHYSICIAN:  Jana Hakim, M.D. DATE OF BIRTH:  March 20, 1941  DATE OF ADMISSION:  01/16/2011 DATE OF DISCHARGE:                             HISTORY & PHYSICAL   PRIMARY CARE PHYSICIAN:  Lennette Bihari L. Little, MD, unassigned.  CHIEF COMPLAINT:  Left hand weakness.  HISTORY OF PRESENT ILLNESS:  This is a 70 year old male with multiple medical problems who presents to the emergency department with complaints of sudden onset of left hand weakness which happened at about 2 p.m.  The patient reports he was bending over to tie his shoe and his left hand became limp.  He could not graft anything.  He denied having any facial drooping or slurring of his speech.  He also denied having any headache, chest pain, or dizziness or shortness of breath.  The patient was taken by his wife to the [facility] and was sent to the emergency department at [facility] for further evaluation.  The patient presented beyond the time for t-PA intervention.  When the patient arrived in the emergency department, he was sent for a CT scan of the head, results of which returned revealing no acute changes, however, chronic disease changes were seen described as chronic white matter hypodensities which were unchanged bilaterally and no acute infarction seen.  PAST MEDICAL HISTORY:  Significant for coronary artery disease/myocardial infarction in the past, hypertension, hyperlipidemia, history of prostate cancer status post radiation seed implants in 2003, also status post right wrist surgery and status post bilateral total knee replacements and status post resection of stomach secondary to peptic ulcer disease and GI bleeding.  MEDICATIONS: 1. Pravachol 40 mg 2 tablets p.o. at bedtime. 2. Aspirin 81 mg p.o. at  bedtime. 3. Imuran 50 mg 3 tablets p.o. q.a.m. 4. Folic acid 1 mg p.o. q.a.m. 5. Azulfidine 500 mg 4 tablets p.o. q.a.m. 6. Slow iron 47.5 mg 1 tablet p.o. q.a.m. 7. Multivitamin 1 tablet p.o. q.a.m. 8. Coreg 6.25 mg 1 p.o. b.i.d. 9. Fish oil 1200 mg 1 tablet p.o. q.a.m. 10.Lisinopril 10 mg 1 p.o. q.p.m. 11.B12 1000 mg 1 p.o. q.a.m. 12.Ester C 1000 mg 1 p.o. q.a.m.  ALLERGIES:  No known drug allergies.  SOCIAL HISTORY:  The patient is married.  He is a nonsmoker, nondrinker. No history of illicit drug usage.  FAMILY HISTORY:  Negative for cerebrovascular disease.  Positive for coronary artery disease in 2 brothers and in 1 sister, hypertension in all of the siblings and no diabetic disease or cancer.  REVIEW OF SYSTEMS:  Pertinent as mentioned above.  PHYSICAL EXAMINATION FINDINGS:  GENERAL:  This is a 70 year old well- nourished, well-developed, younger than stated age appearing 71 year old Caucasian male who is in no acute distress. VITAL SIGNS:  Temperature 99.3, blood pressure 145/73, heart rate 65, respirations 18, O2 saturation 94-96%. HEENT:  Normocephalic, atraumatic.  Pupils equally round and reactive to light.  Extraocular movements are intact.  Funduscopic benign.  There is no scleral icterus.  Nares  are patent bilaterally.  Oropharynx is clear. NECK:  Supple.  Full range of motion.  No thyromegaly, adenopathy, or jugular venous distention. CARDIOVASCULAR:  Regular rate and rhythm with mild bradycardia.  Normal S1, S2.  No murmurs, gallops, or rubs appreciated. LUNGS:  Clear to auscultation bilaterally.  No rales, rhonchi, or wheezes. ABDOMEN:  Positive bowel sounds, soft, nontender, nondistended.  No hepatosplenomegaly. EXTREMITIES:  Without cyanosis, clubbing, or edema. NEUROLOGIC:  The patient is alert and oriented x3.  His speech sounds slurred but he and his wife report this is his usual pattern of speech. There is no facial drooping.  No tongue deviation.   His cranial nerves are intact.  There is no pronator drifting at this time.  His grip strength has improved in both hands and is 5/5 at this time.  Please note that on the earlier EDP examination, the patient had a flaccid limb, left wrist.  He is able to move all 4 of his extremities and there are no sensory or motor deficits at this time.  Gait has not been assessed.  LABORATORY STUDIES:  White blood cell count 5.7, hemoglobin 11.6, hematocrit 33.7, MCV 97.7, platelets 215,000, neutrophils 45%, lymphocytes 27%.  Sodium 139, potassium 3.8, chloride 106, carbon dioxide 25, BUN 22, creatinine 1.09, and glucose 103.  Protime 13.9, PTT 29, INR 1.05.  Point-of-care cardiac markers with myoglobin of 82.3, CK- MB less than 1.0, troponin less than 0.05, and chest x-ray reveals cardiomegaly and an elevated right hemidiaphragm.  ASSESSMENT:  A 70 year old male being admitted with: 1. Transient ischemic attack, rule out cerebrovascular accident. 2. Coronary artery disease. 3. Hypertension. 4. Dyslipidemia. 5. Normocytic anemia.  PLAN:  The patient will be admitted to the neuro tele area for further neurologic and cardiopulmonary monitoring.  A CVA workup will be initiated.  An MRI, MRA study of the brain will be ordered in the a.m. and the patient will be placed on aspirin therapy at full dose at this time.  The patient will have further evaluations pending results of his studies and clinical course.  His medications will be further reconciled and the patient will be placed on DVT prophylaxis.  The patient is a full code.     Jana Hakim, M.D.     HJ/MEDQ  D:  01/16/2011  T:  01/16/2011  Job:  [id number]  Electronically Signed by Jana Hakim M.D. on 01/17/2011 08:02:53 PM

## 2011-01-18 LAB — CBC
HCT: 33.8 % — ABNORMAL LOW (ref 39.0–52.0)
Hemoglobin: 11.6 g/dL — ABNORMAL LOW (ref 13.0–17.0)
MCH: 33.4 pg (ref 26.0–34.0)
RBC: 3.47 MIL/uL — ABNORMAL LOW (ref 4.22–5.81)

## 2011-02-08 NOTE — Discharge Summary (Signed)
Aaron Whitaker, GODLEWSKI             ACCOUNT NO.:  [id number]  MEDICAL RECORD NO.:  [id number]           PATIENT TYPE:  I  LOCATION:  3037                         FACILITY:  [facility]  PHYSICIAN:  Verneita Griffes, MD        DATE OF BIRTH:  [birth year]  DATE OF ADMISSION:  01/16/2011 DATE OF DISCHARGE:  01/18/2011                              DISCHARGE SUMMARY   DISCHARGE DIAGNOSES: 1. Acute right hemispheric cortical subcentimeter infarct. 2. History of inferior myocardial infarction in 2007 with CABG x5. 3. Hyperlipidemia with LDL of 98, current goal of below 70. 4. History of prostate cancer, status post radiation implant. 5. Anemia likely of nutritional origin.  Hemoglobin at discharge 11.6. 6. Possible ulcerative colitis, is on Imuran and azathioprine. 7. 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  DISCHARGE MEDICATIONS: 1. Lisinopril 10 mg 1 tablet at bedtime. 2. Fish oil 1200 mg p.o. one caps q.a.m. 3. Carvedilol 6.25 one tablet b.i.d. with meals. 4. Colace. 5. Pravachol. 6. Crestor 20 mg 1 tablet daily.  Please note this is changed from his     prior alcohol that he has been on. 7. Azathioprine 50 three tablets q.a.m. 8. Ferrous sulfate 325 mg daily. 9. Multivitamin daily. 10.Aggrenox 25/200 one tablet daily. 11.Sulfasalazine 4 tablets q.a.m. 12.Ester C 1000 mg over-the-counter q.a.m. 13.Slow iron over-the-counter. 14.Folic acid 1 mg. [id number] over-the-counter.  PERTINENT CONSULTS:  Nil.  BRIEF HOSPITAL COURSE: 1. The patient is a 70 year old with multiple medical issues, who     presented with left hand weakness about 2:00 p.m.  He was trying to     tie his shoe, became limp, could not grasp anything.  He did not     have any slurred speech.  He started having some difficulty moving     the upper arm.  Preset, presented outside the window of TPA.  He     was sent for CT scan of the brain, which showed no changes, but was     admitted for further workup.  He  has extensive coronary artery     disease as mentioned above and was risk stratified.  He had an MRI,     which showed a new acute CVA and he was transitioned off aspirin     and placed on Aggrenox.  He will need a followup in the outpatient     setting and secondary risk factor modification with Crestor and     also diet and exercise, counseling and education from his     outpatient physician. 2. Coronary artery disease.  This is stable on hospital. 3. Hypertension.  Blood pressures on day of discharge were well     controlled at 96-109/60-59. 4. Dyslipidemia.  Please note change in pravastatin to Pravachol. 5. Normocytic anemia.  The patient will continue on his medications as     prior. 6. History of prostate cancer status post radiation seed implant.  The     patient will need followup with his urologist for screening, may     benefit from PSAs as an outpatient. 7. HbA1c of 5.8.  The patient  has impaired glucose tolerance and would     likely benefit from A1c done in another 6 months to 1 year.  The patient was doing very well on the day of discharge.  His focal deficit has fully resolved.  He was not short of breath.  He was able to tolerate p.o.  He was walking, standing.  He had some slurred speech at baseline.  Chest is clinically clear.  S1 and S2, no murmurs, rubs or gallops noted, midchest scar.  Abdomen is soft, nontender, nondistended. Extremities are moving well and he was thought to be stable for discharge.  It was a pleasure taking care of him.  The patient has been instructed to make a follow-up appointment with Dr. Rex Kras in 4-5 days and Dr. Leonie Man in about 1 month.          ______________________________ Verneita Griffes, MD     JS/MEDQ  D:  01/18/2011  T:  01/18/2011  Job:  [id number]  cc:   Lennette Bihari L. Little, M.D. Pramod P. Leonie Man, MD  Electronically Signed by Verneita Griffes MD on 02/08/2011 01:10:57 PM

## 2011-03-16 ENCOUNTER — Ambulatory Visit (HOSPITAL_COMMUNITY)
Admission: RE | Admit: 2011-03-16 | Discharge: 2011-03-16 | Disposition: A | Discharge status: Home / Self Care | Payer: Medicare Other | Source: Ambulatory Visit | Attending: Internal Medicine | Admitting: Internal Medicine

## 2011-03-16 DIAGNOSIS — I700 Atherosclerosis of aorta: Secondary | ICD-10-CM | POA: Insufficient documentation

## 2011-03-16 DIAGNOSIS — I6509 Occlusion and stenosis of unspecified vertebral artery: Secondary | ICD-10-CM | POA: Insufficient documentation

## 2011-03-16 DIAGNOSIS — I69998 Other sequelae following unspecified cerebrovascular disease: Secondary | ICD-10-CM | POA: Insufficient documentation

## 2011-03-16 DIAGNOSIS — Z79899 Other long term (current) drug therapy: Secondary | ICD-10-CM | POA: Insufficient documentation

## 2011-03-16 DIAGNOSIS — Z951 Presence of aortocoronary bypass graft: Secondary | ICD-10-CM | POA: Insufficient documentation

## 2011-03-16 DIAGNOSIS — E785 Hyperlipidemia, unspecified: Secondary | ICD-10-CM | POA: Insufficient documentation

## 2011-03-16 DIAGNOSIS — R5381 Other malaise: Secondary | ICD-10-CM | POA: Insufficient documentation

## 2011-03-16 DIAGNOSIS — Q211 Atrial septal defect: Secondary | ICD-10-CM | POA: Insufficient documentation

## 2011-03-16 DIAGNOSIS — Q2111 Secundum atrial septal defect: Secondary | ICD-10-CM | POA: Insufficient documentation

## 2011-03-16 DIAGNOSIS — I252 Old myocardial infarction: Secondary | ICD-10-CM | POA: Insufficient documentation

## 2011-03-16 DIAGNOSIS — I6789 Other cerebrovascular disease: Secondary | ICD-10-CM

## 2011-03-16 DIAGNOSIS — R5383 Other fatigue: Secondary | ICD-10-CM | POA: Insufficient documentation

## 2011-03-16 DIAGNOSIS — Z7982 Long term (current) use of aspirin: Secondary | ICD-10-CM | POA: Insufficient documentation

## 2011-03-16 DIAGNOSIS — I10 Essential (primary) hypertension: Secondary | ICD-10-CM | POA: Insufficient documentation

## 2011-03-20 ENCOUNTER — Telehealth: Payer: Self-pay | Admitting: Internal Medicine

## 2011-03-20 NOTE — Telephone Encounter (Signed)
Pt had TEE on Monday.  Referred by Neurologist but was told what the problem was by Dr. Harrington Challenger.  Would Dr. Harrington Challenger give them her opinion on what should be done.  Medication vs. Surgery?  Please call wife with that information.

## 2011-05-07 ENCOUNTER — Other Ambulatory Visit (HOSPITAL_BASED_OUTPATIENT_CLINIC_OR_DEPARTMENT_OTHER): Payer: Medicare Other

## 2011-05-08 ENCOUNTER — Encounter: Payer: Medicare Other | Admitting: Hematology & Oncology

## 2011-05-08 ENCOUNTER — Other Ambulatory Visit: Payer: Self-pay | Admitting: Hematology & Oncology

## 2011-05-08 ENCOUNTER — Ambulatory Visit (HOSPITAL_BASED_OUTPATIENT_CLINIC_OR_DEPARTMENT_OTHER)
Admission: RE | Admit: 2011-05-08 | Discharge: 2011-05-08 | Disposition: A | Discharge status: Home / Self Care | Payer: Medicare Other | Source: Ambulatory Visit | Attending: Hematology & Oncology | Admitting: Hematology & Oncology

## 2011-05-08 DIAGNOSIS — R911 Solitary pulmonary nodule: Secondary | ICD-10-CM

## 2011-05-08 DIAGNOSIS — J984 Other disorders of lung: Secondary | ICD-10-CM | POA: Insufficient documentation

## 2011-05-08 DIAGNOSIS — K449 Diaphragmatic hernia without obstruction or gangrene: Secondary | ICD-10-CM | POA: Insufficient documentation

## 2011-05-08 DIAGNOSIS — K520 Gastroenteritis and colitis due to radiation: Secondary | ICD-10-CM

## 2011-05-08 LAB — CBC WITH DIFFERENTIAL (CANCER CENTER ONLY)
BASO%: 0.4 % (ref 0.0–2.0)
Eosinophils Absolute: 1.2 10*3/uL — ABNORMAL HIGH (ref 0.0–0.5)
MCH: 35.4 pg — ABNORMAL HIGH (ref 28.0–33.4)
MONO%: 8.9 % (ref 0.0–13.0)
NEUT#: 2.3 10*3/uL (ref 1.5–6.5)
Platelets: 195 10*3/uL (ref 145–400)
RBC: 3.16 10*6/uL — ABNORMAL LOW (ref 4.20–5.70)
WBC: 4.8 10*3/uL (ref 4.0–10.0)

## 2011-05-08 LAB — CMP (CANCER CENTER ONLY)
ALT(SGPT): 13 U/L (ref 10–47)
AST: 22 U/L (ref 11–38)
Alkaline Phosphatase: 54 U/L (ref 26–84)
CO2: 25 mEq/L (ref 18–33)
Sodium: 138 mEq/L (ref 128–145)
Total Bilirubin: 0.6 mg/dl (ref 0.20–1.60)
Total Protein: 6.9 g/dL (ref 6.4–8.1)

## 2011-05-08 LAB — CHCC SATELLITE - SMEAR

## 2011-05-08 MED ORDER — IOHEXOL 300 MG/ML  SOLN
80.0000 mL | Freq: Once | INTRAMUSCULAR | Status: AC | PRN
  Administered 2011-05-08: 80 mL via INTRAVENOUS

## 2011-05-25 ENCOUNTER — Encounter (HOSPITAL_BASED_OUTPATIENT_CLINIC_OR_DEPARTMENT_OTHER): Payer: Medicare Other | Admitting: Hematology & Oncology

## 2011-05-25 ENCOUNTER — Encounter: Payer: Medicare Other | Admitting: Hematology & Oncology

## 2011-05-25 ENCOUNTER — Other Ambulatory Visit: Payer: Self-pay | Admitting: Hematology & Oncology

## 2011-05-25 DIAGNOSIS — R918 Other nonspecific abnormal finding of lung field: Secondary | ICD-10-CM

## 2011-05-25 DIAGNOSIS — Z8546 Personal history of malignant neoplasm of prostate: Secondary | ICD-10-CM

## 2011-05-25 DIAGNOSIS — J984 Other disorders of lung: Secondary | ICD-10-CM

## 2011-05-25 DIAGNOSIS — D721 Eosinophilia, unspecified: Secondary | ICD-10-CM

## 2011-05-29 LAB — CBC
HCT: 31.8 — ABNORMAL LOW
Hemoglobin: 10.9 — ABNORMAL LOW
MCHC: 34.3
MCV: 101.1 — ABNORMAL HIGH
Platelets: 226
RBC: 3.15 — ABNORMAL LOW
RDW: 14.6
WBC: 5.2

## 2011-05-29 LAB — COMPREHENSIVE METABOLIC PANEL
CO2: 27
Calcium: 9.2
Creatinine, Ser: 1.13
GFR calc non Af Amer: >60
Glucose, Bld: 88
Total Protein: 6.5

## 2011-05-29 LAB — DIFFERENTIAL
Basophils Absolute: 0
Basophils Relative: 0
Eosinophils Absolute: 0.9 — ABNORMAL HIGH
Eosinophils Relative: 17 — ABNORMAL HIGH
Lymphocytes Relative: 19
Lymphs Abs: 1
Monocytes Absolute: 0.5
Monocytes Relative: 10
Neutro Abs: 2.8
Neutrophils Relative %: 54

## 2011-05-29 LAB — OCCULT BLOOD X 1 CARD TO LAB, STOOL: Fecal Occult Bld: POSITIVE

## 2011-05-29 LAB — COMPREHENSIVE METABOLIC PANEL WITH GFR
ALT: 14
AST: 23
Albumin: 3.8
Alkaline Phosphatase: 68
BUN: 20
Chloride: 111
GFR calc Af Amer: >60
Potassium: 4.2
Sodium: 142
Total Bilirubin: 0.6

## 2011-09-04 DIAGNOSIS — I2510 Atherosclerotic heart disease of native coronary artery without angina pectoris: Secondary | ICD-10-CM | POA: Diagnosis not present

## 2011-09-04 DIAGNOSIS — I10 Essential (primary) hypertension: Secondary | ICD-10-CM | POA: Diagnosis not present

## 2011-09-04 DIAGNOSIS — Z951 Presence of aortocoronary bypass graft: Secondary | ICD-10-CM | POA: Diagnosis not present

## 2011-11-02 ENCOUNTER — Other Ambulatory Visit (HOSPITAL_BASED_OUTPATIENT_CLINIC_OR_DEPARTMENT_OTHER): Payer: Medicare Other | Admitting: Lab

## 2011-11-02 ENCOUNTER — Ambulatory Visit (HOSPITAL_BASED_OUTPATIENT_CLINIC_OR_DEPARTMENT_OTHER)
Admission: RE | Admit: 2011-11-02 | Discharge: 2011-11-02 | Disposition: A | Discharge status: Home / Self Care | Payer: Medicare Other | Source: Ambulatory Visit | Attending: Hematology & Oncology | Admitting: Hematology & Oncology

## 2011-11-02 DIAGNOSIS — Z8546 Personal history of malignant neoplasm of prostate: Secondary | ICD-10-CM

## 2011-11-02 DIAGNOSIS — J984 Other disorders of lung: Secondary | ICD-10-CM | POA: Diagnosis not present

## 2011-11-02 DIAGNOSIS — D721 Eosinophilia, unspecified: Secondary | ICD-10-CM

## 2011-11-02 DIAGNOSIS — Z87891 Personal history of nicotine dependence: Secondary | ICD-10-CM | POA: Insufficient documentation

## 2011-11-02 DIAGNOSIS — R918 Other nonspecific abnormal finding of lung field: Secondary | ICD-10-CM | POA: Insufficient documentation

## 2011-11-02 DIAGNOSIS — R911 Solitary pulmonary nodule: Secondary | ICD-10-CM

## 2011-11-02 DIAGNOSIS — K520 Gastroenteritis and colitis due to radiation: Secondary | ICD-10-CM | POA: Diagnosis not present

## 2011-11-02 DIAGNOSIS — C61 Malignant neoplasm of prostate: Secondary | ICD-10-CM | POA: Diagnosis not present

## 2011-11-02 DIAGNOSIS — F17200 Nicotine dependence, unspecified, uncomplicated: Secondary | ICD-10-CM | POA: Diagnosis not present

## 2011-11-02 LAB — CBC WITH DIFFERENTIAL (CANCER CENTER ONLY)
BASO%: 0.4 % (ref 0.0–2.0)
Eosinophils Absolute: 1.2 10*3/uL — ABNORMAL HIGH (ref 0.0–0.5)
LYMPH%: 20.2 % (ref 14.0–48.0)
MCV: 104 fL — ABNORMAL HIGH (ref 82–98)
MONO#: 0.5 10*3/uL (ref 0.1–0.9)
MONO%: 10.7 % (ref 0.0–13.0)
NEUT#: 2.2 10*3/uL (ref 1.5–6.5)
Platelets: 193 10*3/uL (ref 145–400)
RBC: 3.48 10*6/uL — ABNORMAL LOW (ref 4.20–5.70)
RDW: 12.4 % (ref 11.1–15.7)
WBC: 4.9 10*3/uL (ref 4.0–10.0)

## 2011-11-02 LAB — CMP (CANCER CENTER ONLY)
Alkaline Phosphatase: 62 U/L (ref 26–84)
CO2: 27 mEq/L (ref 18–33)
Creat: 1.1 mg/dl (ref 0.6–1.2)
Glucose, Bld: 101 mg/dL (ref 73–118)
Total Bilirubin: 0.5 mg/dl (ref 0.20–1.60)

## 2011-11-02 LAB — MORPHOLOGY - CHCC SATELLITE

## 2011-11-02 LAB — LACTATE DEHYDROGENASE: LDH: 142 U/L (ref 94–250)

## 2011-11-02 MED ORDER — IOHEXOL 300 MG/ML  SOLN
80.0000 mL | Freq: Once | INTRAMUSCULAR | Status: AC | PRN
  Administered 2011-11-02: 80 mL via INTRAVENOUS

## 2011-11-09 ENCOUNTER — Ambulatory Visit (HOSPITAL_BASED_OUTPATIENT_CLINIC_OR_DEPARTMENT_OTHER): Payer: Medicare Other | Admitting: Hematology & Oncology

## 2011-11-09 DIAGNOSIS — D721 Eosinophilia, unspecified: Secondary | ICD-10-CM

## 2011-11-09 DIAGNOSIS — R911 Solitary pulmonary nodule: Secondary | ICD-10-CM

## 2011-11-09 NOTE — Progress Notes (Signed)
This office note has been dictated.

## 2011-11-09 NOTE — Progress Notes (Signed)
CC:   Tamsen Roers, MD Joyice Faster. Rolla Flatten., M.D.  DIAGNOSES: 1. Chronic eosinophilia. 2. Right lower lobe pulmonary nodule, stable by CT scan. 3. History of localized prostate cancer.  CURRENT THERAPY:  Observation.  INTERVAL HISTORY:  Mr. Sheppard comes in for followup.  He is going to have his PFO fixed next week.  This will be done as an outpatient.  We did go ahead and repeat his CT scan.  This was done on 11/02/2011. The CT scan was stable.  The right lung nodule measuring 1.3 x 1.1 x 1.3 cm.  This is stable subcarinal lymph node measuring 0.9 cm.  He also looked okay.  He has had no problem with fever.  He has had no bony pain.  He has had no rashes.  He has had no nausea or vomiting.  There has been no change in bowel or bladder habits.  PHYSICAL EXAMINATION:  General:  This is a well-developed, well- nourished white gentleman in no obvious distress.  Vital signs:  97.1, pulse 68, respiratory rate 18, blood pressure 122/78.  Weight is 231. Head and neck:  Exam shows a normocephalic, atraumatic skull.  There are no ocular or oral lesions.  No palpable cervical or supraclavicular lymph nodes.  Lungs:  Are clear bilaterally.  Cardiac:  Regular rhythm with a normal S1 and S2.  There are no murmurs, rubs or bruits. Abdomen:  Soft with good bowel sounds.  There is no palpable abdominal mass.  There is no palpable hepatosplenomegaly.  Back:  No tenderness over the spine, ribs or hips.  Extremities:  No clubbing, cyanosis or edema.  Neurological:  Shows no focal neurological deficits.  Skin: Exam shows no rashes, ecchymoses or petechiae.  LABORATORY STUDIES:  Show white cell count 4.9, hemoglobin 12.4, hematocrit 36.3, platelet count 193.  White cell differential shows 45 segs, 20 lymphocytes, 10 monos and 24 eosinophils.  Peripheral smear shows increase in eosinophils and appear to be mature. I do not see any immature myeloid cells.  There are no blasts.  There is no  rouleaux formation.  I see no schistocytes.  Platelets are adequate in number and size.  IMPRESSION:  Mr. Englert is a 71 year old gentleman with eosinophilia. This has been holding stable.  I do not believe that he has chronic eosinophilia syndrome.  He has had this now for over 8 years.  He has had a bone marrow biopsy done previously.  This was done back in 2007.  I think as long as the blood counts look great and are stable, I do not see the need to put him through another bone marrow test.  We will go ahead and follow up with another CT scan in about 6 months. If all looks good at that point time, then we can probably just hold off on further scans on him.    ______________________________ Volanda Napoleon, M.D. PRE/MEDQ  D:  11/09/2011  T:  11/09/2011  Job:  [id number]

## 2011-11-11 DIAGNOSIS — I2510 Atherosclerotic heart disease of native coronary artery without angina pectoris: Secondary | ICD-10-CM | POA: Diagnosis not present

## 2011-11-11 DIAGNOSIS — I6350 Cerebral infarction due to unspecified occlusion or stenosis of unspecified cerebral artery: Secondary | ICD-10-CM | POA: Diagnosis not present

## 2011-11-11 DIAGNOSIS — Q211 Atrial septal defect: Secondary | ICD-10-CM | POA: Diagnosis not present

## 2011-11-11 DIAGNOSIS — Z951 Presence of aortocoronary bypass graft: Secondary | ICD-10-CM | POA: Diagnosis not present

## 2011-11-11 DIAGNOSIS — Z01810 Encounter for preprocedural cardiovascular examination: Secondary | ICD-10-CM | POA: Diagnosis not present

## 2011-11-16 ENCOUNTER — Encounter (HOSPITAL_COMMUNITY): Payer: Self-pay | Admitting: Pharmacy Technician

## 2011-11-16 NOTE — H&P (Signed)
CC: Recent stroke.  HOPI: Patient referred  for follow-up/evaluation and treatment of PFO and CAD.   Patient is a fairly 71 year old active gentleman who plays golf leads active lifestyle.  Had been doing well until 01/16/2011 when he had returned from playing golf at this time the shoelace suddenly felt clumsiness in his left arm.  She was admitted to the hospital with acute cortical hemispheric infarct.  He is fortunately recuperated most of his strength back in his left arm.  He is presently asymptomatic.  Outpatient evaluation of his stroke led to the diagnosis of intracardiac shunting with a very strongly positive transcranial bubble study for right-to-left shunting. Patient has had a prior infarct of the his CABG in 2007 perioperatively.  This is his second stroke.  He now presents to me to establish for cardiac care.  I had seen him in 2010. He denies any chest pain shortness of breath PND or orthopnea.   ROS: Arthritis-no;  Cramping of the legs and feet at night or with activity-occasionally; Diabetes-no;  Hypothyroidism-no;  Previous Stroke-yes 2012, Previous GI Bleed-yes 1987 ,  Recent weight change-no . Other systems negative.   Past Medical History (Major events, hospitalizations, surgeries):  MI/CABG (bypass x5) 03/29/06: LIMA to LAD, SVG to OM1 and OM 2, SVG to PDA and PLA [name], MD. Had perioperative stroke in 2007. Heart cath:  Occluded Proximal RCA, Prox circumflex 70%, mid LAD 90%. Failed PTCA RCA.  Nuclear stress test 6/08 Inferolateral scar with mild apical ischemia.  Echo 10/11 normal LVEF, aneurysmal atrial septum. 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 Strongly positive TCD bubble study 05/25/11.  Prostate cancer (seed implants and radiation) 2003. Emergency abdominal surgery due to ruptured ulcers 1987. Left arm surgery (titanium bar) in the 90's. Both knees operated on in the 60's (metal plate in left knee).    Known allergies:  NKDA.     Ongoing medical problems: CAD S/P CABG, PFO.  Stroke left hand clumsyness on  01/16/2011, Ulcerative Colitis. Hypertension. Hyperlipidemia.    Family medical history: Mother died at age 70 from uterine cancer. Father died at age 29 in a fire. 9 siblings (7 living)-3 have had MI's.    Preventative: Colonoscopy 2010 (removed benign polyps). Endoscopy 2006.    Social history: No smoking-quit in 1967. No alcohol. Married. 2 step-children.  Exam:  71 " tall, 191 Lbs. rR 15, Pulse 59/min, reg, BP 140/80 mm Hg   Well build and normal body habitus who is  in no acute distress. Appears stated age. Alert Ox3.  There is no cyanosis.  HEENT: normal limits.  CAROTIDS: No carotid bruits are noted.  CARDIAC EXAM: S1 S2 normal, no gallop, II/VI SEM noted in apex and right sternal border.  CHEST EXAM: No tenderness of chest wall.  LUNGS: Clear to percuss and auscultate. No rales or ronchi.  ABDOMEN: No hepatosplenomegaly.  BS normal in all 4 quadrants. Abdomen is non-tender.  MUSCULOSKELETAL EXAM: Intact with full range of motion in all 4 extremities.  NEUROLOGIC EXAM: Grossly intact without any focal deficits. Alert O x 3.   EXTREMITY: No edema. Normal pulses.  VASCULAR EXAM: No skin breakdown. Carotids  normal. Extremities: Femoral pulse normal. Popliteal pulse normal ; Pedal pulse normal.  Otherwise No prominent pulse felt in the abdomen. No varicose veins.  Impression:  Patient is a fairly 71 year old active gentleman who plays golf leads active lifestyle.  Had been doing well until 01/16/2011 when he had returned from  playing golf at this time the shoelace suddenly felt clumsiness in his left arm.  She was admitted to the hospital with acute cortical hemispheric infarct.  He is fortunately recuperated most of his strength back in his left arm.  He is presently asymptomatic.  1. Recurrent stroke.  Had  post CABG perioperative stroke in 2007 with weakness in his left leg which is recuperated completely.   Second stroke right hemispheric cortical infarct with left hand clumsyness on  01/16/2011. Strongly positive TCD bubble study 05/25/11.  TEE 03/16/11: Large PFO, strongly positive Rt to Lt shunting. Normal LV, No significant plaque aorta. 2.  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 Had perioperative stroke in 2007. Heart cath:  Occluded Proximal RCA, Prox circumflex 70%, mid LAD 90%. Failed PTCA RCA. : Doing well and on appropriate medical therapy. No angina, dyspnea or CHF on exam. Continue present medical therpy.   ECG 08/26/11: S. Bradycardia @ 59/min. Baseline wander. Probable inferior infarct old. Poor R progression. Lexiscan stress 09/04/11: Inferior and lateral scar with ischemia (moderate). No change 2008. EF 46%  3. Hyperlipidemia.  Labs 08/04/11:   TC 152, TG 127, HDL 34, LDL 93.  CMP normal. Hb 11.5/HCT 35.8, MCV 104. 4. Hypertension at goal. BP 140/80 mm Hg.   5. Large PFO with strongly positive right to left shunting by TEE and TCB bubble study.  Plan:   Mechanism of underlying disease process and action of medications discussed with the patient.   I discussed primary/secondary prevention and also dietary counceling was done.  Continue current medications unchanged.  Will increase the dose of Pravachol  to 80mg  qpm  as his LDL is not at goal. Otherwise no change was made in his medications.     Given the fact that the current stroke in a patient was fairly active and in spite of him being on appropriate medical therapy including antiplatelet therapy and given the fact that he is a very large PFO by TEE I have recommended that we proceed with closure of the patent foramen ovale.   Patient understands < 1% risk of death, stroke, MI, bleeding, infection, pericardial effusion and also risk of atrial arrhythmias explained to the patient and his wife. I do believe that Mr Blye is active and has taken care of his health  issues and now has recurrent stroke and is appropriate to proceed with ASD closure. 50 minutes or so of time spent with face to face discussion with the patient and his wife.

## 2011-11-17 ENCOUNTER — Ambulatory Visit (HOSPITAL_COMMUNITY)
Admission: RE | Admit: 2011-11-17 | Discharge: 2011-11-18 | Disposition: A | Discharge status: Home / Self Care | Payer: Medicare Other | Source: Ambulatory Visit | Attending: Cardiology | Admitting: Cardiology

## 2011-11-17 ENCOUNTER — Other Ambulatory Visit: Payer: Self-pay

## 2011-11-17 ENCOUNTER — Encounter (HOSPITAL_COMMUNITY): Payer: Self-pay | Admitting: General Practice

## 2011-11-17 ENCOUNTER — Encounter (HOSPITAL_COMMUNITY)
Admission: RE | Disposition: A | Discharge status: Home / Self Care | Payer: Self-pay | Source: Ambulatory Visit | Attending: Cardiology

## 2011-11-17 DIAGNOSIS — I10 Essential (primary) hypertension: Secondary | ICD-10-CM | POA: Insufficient documentation

## 2011-11-17 DIAGNOSIS — Z951 Presence of aortocoronary bypass graft: Secondary | ICD-10-CM | POA: Insufficient documentation

## 2011-11-17 DIAGNOSIS — D721 Eosinophilia, unspecified: Secondary | ICD-10-CM

## 2011-11-17 DIAGNOSIS — Z8546 Personal history of malignant neoplasm of prostate: Secondary | ICD-10-CM | POA: Insufficient documentation

## 2011-11-17 DIAGNOSIS — Z8673 Personal history of transient ischemic attack (TIA), and cerebral infarction without residual deficits: Secondary | ICD-10-CM | POA: Insufficient documentation

## 2011-11-17 DIAGNOSIS — I2510 Atherosclerotic heart disease of native coronary artery without angina pectoris: Secondary | ICD-10-CM

## 2011-11-17 DIAGNOSIS — Q211 Atrial septal defect, unspecified: Secondary | ICD-10-CM

## 2011-11-17 DIAGNOSIS — R911 Solitary pulmonary nodule: Secondary | ICD-10-CM

## 2011-11-17 DIAGNOSIS — Q2111 Secundum atrial septal defect: Secondary | ICD-10-CM | POA: Insufficient documentation

## 2011-11-17 DIAGNOSIS — I69359 Hemiplegia and hemiparesis following cerebral infarction affecting unspecified side: Secondary | ICD-10-CM

## 2011-11-17 DIAGNOSIS — E785 Hyperlipidemia, unspecified: Secondary | ICD-10-CM | POA: Diagnosis not present

## 2011-11-17 DIAGNOSIS — I6350 Cerebral infarction due to unspecified occlusion or stenosis of unspecified cerebral artery: Secondary | ICD-10-CM | POA: Diagnosis not present

## 2011-11-17 DIAGNOSIS — I69398 Other sequelae of cerebral infarction: Secondary | ICD-10-CM

## 2011-11-17 HISTORY — DX: Thyrotoxicosis, unspecified without thyrotoxic crisis or storm: E05.90

## 2011-11-17 HISTORY — DX: Reserved for inherently not codable concepts without codable children: IMO0001

## 2011-11-17 HISTORY — DX: Encounter for other specified aftercare: Z51.89

## 2011-11-17 HISTORY — PX: PATENT FORAMEN OVALE CLOSURE: SHX5483

## 2011-11-17 HISTORY — DX: Vitamin B12 deficiency anemia due to intrinsic factor deficiency: D51.0

## 2011-11-17 HISTORY — DX: Gastrointestinal hemorrhage, unspecified: K92.2

## 2011-11-17 HISTORY — DX: Unspecified osteoarthritis, unspecified site: M19.90

## 2011-11-17 HISTORY — DX: Cerebral infarction, unspecified: I63.9

## 2011-11-17 LAB — POCT ACTIVATED CLOTTING TIME
Activated Clotting Time: 182 seconds
Activated Clotting Time: 204 seconds

## 2011-11-17 SURGERY — PATENT FORAMEN OVALE CLOSURE
Anesthesia: LOCAL

## 2011-11-17 MED ORDER — MIDAZOLAM HCL 2 MG/2ML IJ SOLN
INTRAMUSCULAR | Status: AC
  Filled 2011-11-17: qty 2

## 2011-11-17 MED ORDER — SODIUM CHLORIDE 0.9 % IJ SOLN: 3.0000 mL | INTRAMUSCULAR | Status: DC | PRN

## 2011-11-17 MED ORDER — SODIUM CHLORIDE 0.9 % IJ SOLN: 3.0000 mL | Freq: Two times a day (BID) | INTRAMUSCULAR | Status: DC

## 2011-11-17 MED ORDER — LISINOPRIL 10 MG PO TABS
10.0000 mg | ORAL_TABLET | Freq: Every day | ORAL | Status: DC
  Administered 2011-11-17 – 2011-11-18 (×2): 10 mg via ORAL
  Filled 2011-11-17 (×2): qty 1

## 2011-11-17 MED ORDER — ACETAMINOPHEN 325 MG PO TABS: 650.0000 mg | ORAL_TABLET | ORAL | Status: DC | PRN

## 2011-11-17 MED ORDER — SULFASALAZINE 500 MG PO TABS
500.0000 mg | ORAL_TABLET | Freq: Four times a day (QID) | ORAL | Status: DC
  Administered 2011-11-17 – 2011-11-18 (×3): 500 mg via ORAL
  Filled 2011-11-17 (×6): qty 1

## 2011-11-17 MED ORDER — SIMVASTATIN 40 MG PO TABS
40.0000 mg | ORAL_TABLET | Freq: Every day | ORAL | Status: DC
  Administered 2011-11-17: 40 mg via ORAL
  Filled 2011-11-17 (×2): qty 1

## 2011-11-17 MED ORDER — SODIUM CHLORIDE 0.9 % IV SOLN: 250.0000 mL | INTRAVENOUS | Status: DC

## 2011-11-17 MED ORDER — HEPARIN SODIUM (PORCINE) 1000 UNIT/ML IJ SOLN
INTRAMUSCULAR | Status: AC
  Filled 2011-11-17: qty 1

## 2011-11-17 MED ORDER — AZATHIOPRINE 50 MG PO TABS
150.0000 mg | ORAL_TABLET | Freq: Every day | ORAL | Status: DC
  Administered 2011-11-17 – 2011-11-18 (×2): 150 mg via ORAL
  Filled 2011-11-17 (×2): qty 3

## 2011-11-17 MED ORDER — HYDROMORPHONE HCL PF 2 MG/ML IJ SOLN
INTRAMUSCULAR | Status: AC
Start: 2011-11-17 — End: 2011-11-17
  Filled 2011-11-17: qty 1

## 2011-11-17 MED ORDER — SODIUM CHLORIDE 0.9 % IV SOLN: 250.0000 mL | INTRAVENOUS | Status: DC | PRN

## 2011-11-17 MED ORDER — CLOPIDOGREL BISULFATE 75 MG PO TABS
75.0000 mg | ORAL_TABLET | Freq: Once | ORAL | Status: AC
  Administered 2011-11-17: 75 mg via ORAL
  Filled 2011-11-17: qty 1

## 2011-11-17 MED ORDER — ASPIRIN 81 MG PO CHEW
324.0000 mg | CHEWABLE_TABLET | ORAL | Status: AC
  Administered 2011-11-17: 324 mg via ORAL
  Filled 2011-11-17: qty 4

## 2011-11-17 MED ORDER — HEPARIN (PORCINE) IN NACL 2-0.9 UNIT/ML-% IJ SOLN
INTRAMUSCULAR | Status: AC
  Filled 2011-11-17: qty 1000

## 2011-11-17 MED ORDER — CARVEDILOL 6.25 MG PO TABS
6.2500 mg | ORAL_TABLET | Freq: Two times a day (BID) | ORAL | Status: DC
  Administered 2011-11-17 – 2011-11-18 (×2): 6.25 mg via ORAL
  Filled 2011-11-17 (×4): qty 1

## 2011-11-17 MED ORDER — ONDANSETRON HCL 4 MG/2ML IJ SOLN: 4.0000 mg | Freq: Four times a day (QID) | INTRAMUSCULAR | Status: DC | PRN

## 2011-11-17 MED ORDER — CLOPIDOGREL BISULFATE 75 MG PO TABS
75.0000 mg | ORAL_TABLET | ORAL | Status: AC
  Administered 2011-11-17: 75 mg via ORAL
  Filled 2011-11-17: qty 1

## 2011-11-17 MED ORDER — SODIUM CHLORIDE 0.9 % IV SOLN
INTRAVENOUS | Status: DC
  Administered 2011-11-17: 06:00:00 via INTRAVENOUS

## 2011-11-17 MED ORDER — ADULT MULTIVITAMIN W/MINERALS CH
1.0000 | ORAL_TABLET | Freq: Every day | ORAL | Status: DC
  Administered 2011-11-17 – 2011-11-18 (×2): 1 via ORAL
  Filled 2011-11-17 (×2): qty 1

## 2011-11-17 MED ORDER — VITAMIN B-12 1000 MCG PO TABS
1000.0000 ug | ORAL_TABLET | Freq: Every day | ORAL | Status: DC
  Administered 2011-11-17 – 2011-11-18 (×2): 1000 ug via ORAL
  Filled 2011-11-17 (×2): qty 1

## 2011-11-17 MED ORDER — MULTIVITAMINS PO CAPS: 1.0000 | ORAL_CAPSULE | Freq: Every day | ORAL | Status: DC

## 2011-11-17 MED ORDER — LIDOCAINE HCL (PF) 1 % IJ SOLN
INTRAMUSCULAR | Status: AC
  Filled 2011-11-17: qty 30

## 2011-11-17 MED ORDER — FERROUS SULFATE 325 (65 FE) MG PO TABS
325.0000 mg | ORAL_TABLET | Freq: Every day | ORAL | Status: DC
  Administered 2011-11-18: 325 mg via ORAL
  Filled 2011-11-17 (×2): qty 1

## 2011-11-17 MED ORDER — FOLIC ACID 1 MG PO TABS
1.0000 mg | ORAL_TABLET | Freq: Every day | ORAL | Status: DC
  Administered 2011-11-17 – 2011-11-18 (×2): 1 mg via ORAL
  Filled 2011-11-17 (×2): qty 1

## 2011-11-17 NOTE — CV Procedure (Signed)
Patient is a 71 year old Caucasian male with history of known coronary artery disease. He has had stroke after his coronary artery bypass grafting in 2007. He has been on aggressive risk modification including  antiplatelet therapy. Patient presented with recurrent stroke again on 01/16/2011. He underwent TEE to evaluate for cardiac source of cerebral emboli and was found to have a very large PFO with a very strongly positive right to left shunt both at rest and with Valsalva maneuver. Due to this it was felt that paradoxical embolus was very highly likely and felt PFO closure is indicated. After obtaining informed consent he is now brought to the clinic catheterization lab for the same.  Procedure: Successful closure of the very large patent common wall and with implantation of a Gore helix 30 mm septal occluder. Strongly positive double contrast study prior to closure was reduced to 0 contrast study at the end of the procedure. There was no other intracardiac shunts or permissions evident during intracardiac echo evaluation and double contrast evaluation.  Recommendation: Patient will need antibiotic prophylaxis for repair of 6 months. He will need continued dual antiplatelet therapy for at least 30 days and then he will need aspirin indefinitely. He'll be discharged home in the morning if he remains stable.   Technique: Understood cushions using the 6 French right femoral venous access and a 9 French right femoral venous access respective sheaths were placed in the veins. Intracardiac probe was advanced through 9 French sheath and the cardiac anatomy was stable reanalyzed including performance of agitated saline contrast injection. We decide to proceed with balloon sizing the PFO. A 24 millimeter AGA sizing balloon was utilized and the PFO including the tunnel was measured. Both by radiographic and intracardiac measurements the lesion measured about 10 mm. Because of highly mobile septum and a  large  PFO we decided to proceed with 30 mm Gore Helix septal occluder. Device position was confirmed both by radiographic and by intravascular. The device was then released. The 6 French sheath that was initially utilized to cross the PFO and place a Rosen soft tip J-wire was advanced and placed in the left upper pulmonary vein. The 6 French sheath was exchanged to an 65 [place] sheath. The helix septal occluder was prepped in the usual fashion and advanced through the 11 French sheath. Under fluoroscopic and intracardiac echo guidance the device was deployed. There was no immediate complications. Attender the procedure the ACT was 202. Intravenous was utilized during the procedure. Attender the procedure of the devices were withdrawn out of the body. Sheaths were sutured in place. No immediate complications were observed.

## 2011-11-18 DIAGNOSIS — I10 Essential (primary) hypertension: Secondary | ICD-10-CM | POA: Diagnosis not present

## 2011-11-18 DIAGNOSIS — I6350 Cerebral infarction due to unspecified occlusion or stenosis of unspecified cerebral artery: Secondary | ICD-10-CM | POA: Diagnosis not present

## 2011-11-18 DIAGNOSIS — Q211 Atrial septal defect: Secondary | ICD-10-CM | POA: Diagnosis not present

## 2011-11-18 DIAGNOSIS — I2510 Atherosclerotic heart disease of native coronary artery without angina pectoris: Secondary | ICD-10-CM | POA: Diagnosis not present

## 2011-11-18 MED ORDER — ASPIRIN 81 MG PO TABS: 81.0000 mg | ORAL_TABLET | Freq: Every day | ORAL | Status: DC

## 2011-11-18 NOTE — Discharge Instructions (Signed)
Do not bathe for 2 days. OK to shower. No driving for 2 days. Needs antibiotics if dental procedure planned.

## 2011-11-18 NOTE — Discharge Summary (Signed)
Physician Discharge Summary  Patient ID: Aaron Whitaker MRN: [id number] DOB/AGE: 1940/11/28 71 y.o.  Admit date: 11/17/2011 Discharge date: 11/18/2011  Primary Discharge Diagnosis Atrial septal defect  S/P 30 mm Helix septal occluder on 11/17/2011 .Secondary Discharge Diagnosis   CAD/ASHD H/O MI/CABG (bypass x5) 03/29/06: LIMA to LAD, SVG to OM1 and OM 2, SVG to PDA and PLA  Hypertension Hyperlipidemia  Significant Diagnostic Studies: Patient underwent cardiac catheterization and intra-cardiac ultrasound guided and fluoroscopy guided closure of the atrial septal defect without any complications.  Consults:   Hospital Course: patient was admitted to the hospital in an elective fashion after he was found to have a very large PFO and felt to be significant in the etiology for his recurrent stroke which was felt to be embolic (paradoxical). No complications were observed during the procedure and he was observed overnight and was felt to be stable for discharge.   Discharge Exam: Blood pressure 140/70, pulse 58, temperature 97.8 F (36.6 C), temperature source Oral, resp. rate 12, height 5\' 11"  (1.803 m), weight 87 kg (191 lb 12.8 oz), SpO2 95.00%.     General appearance: alert, appears stated age and no distress Resp: clear to auscultation bilaterally Cardio: regular rate and rhythm, S1, S2 normal, no murmur, click, rub or gallop GI: soft, non-tender; bowel sounds normal; no masses,  no organomegaly Extremities: extremities normal, atraumatic, no cyanosis or edema and Right groin site has no hematoma Labs:   Lab Results  Component Value Date   WBC 4.9 11/02/2011   HGB 12.4* 11/02/2011   HCT 36.3* 11/02/2011   MCV 104* 11/02/2011   PLT 193 11/02/2011   No results found for this basename: NA,K,CL,CO2,BUN,CREATININE,CALCIUM,LABALBU,PROT,BILITOT,ALKPHOS,ALT,AST,GLUCOSE in the last 168 hours   Lab Results  Component Value Date   CHOL 155 01/17/2011   Lab Results  Component Value Date     HDL 34* 01/17/2011   Lab Results  Component Value Date   LDLCALC  Value: 98        Total Cholesterol/HDL:CHD Risk Coronary Heart Disease Risk Table                     Men   Women  1/2 Average Risk   3.4   3.3  Average Risk       5.0   4.4  2 X Average Risk   9.6   7.1  3 X Average Risk  23.4   11.0        Use the calculated Patient Ratio above and the CHD Risk Table to determine the patient's CHD Risk.        ATP III CLASSIFICATION (LDL):  <100     mg/dL   Optimal  100-129  mg/dL   Near or Above                    Optimal  130-159  mg/dL   Borderline  160-189  mg/dL   High  >190     mg/dL   Very High 01/17/2011   Lab Results  Component Value Date   TRIG 114 01/17/2011   Lab Results  Component Value Date   CHOLHDL 4.6 01/17/2011   No results found for this basename: LDLDIRECT      Radiology: Ct Chest W Contrast  11/02/2011  *RADIOLOGY REPORT*    IMPRESSION: Stable appearance of pulmonary nodules.  Stability will need to be concluded over 2 years.  Next followup CT scan in 6 months (can be performed  without contrast).  Original Report Authenticated By: Doug Sou, M.D.    EKG:S. Bradycardia.  FOLLOW UP PLANS AND APPOINTMENTS Discharge Orders    Future Appointments: Provider: Department: Dept Phone: Center:   06/01/2012 8:00 AM [facility] [phone] None   06/01/2012 9:00 AM [facility] [phone] [facility]   06/08/2012 8:00 AM Volanda Napoleon, MD [facility] [phone] None     Medication List  As of 11/18/2011  8:37 AM   TAKE these medications         aspirin 81 MG tablet   Take 1 tablet (81 mg total) by mouth daily.      azaTHIOprine 50 MG tablet   Commonly known as: IMURAN   Take 150 mg by mouth daily.      carvedilol 12.5 MG tablet   Commonly known as: COREG   Take 6.25 mg by mouth 2 (two) times daily with a meal.      clopidogrel 75 MG tablet   Commonly known as: PLAVIX   Take 75 mg by mouth Daily.      ESTER-C 1000-50 MG Tabs    Take by mouth.      ferrous sulfate 325 (65 FE) MG tablet   Take 325 mg by mouth daily with breakfast.      fish oil-omega-3 fatty acids 1000 MG capsule   Take 1,200 mg by mouth daily.      folic acid 1 MG tablet   Commonly known as: FOLVITE   Take 1 mg by mouth daily.      lisinopril 10 MG tablet   Commonly known as: PRINIVIL,ZESTRIL   Take 10 mg by mouth Daily.      multivitamin capsule   Take 1 capsule by mouth daily.      pravastatin 40 MG tablet   Commonly known as: PRAVACHOL   Take 80 mg by mouth Daily.      sulfaSALAzine 500 MG tablet   Commonly known as: AZULFIDINE   Take 500 mg by mouth 4 (four) times daily.      vitamin B-12 1000 MCG tablet   Commonly known as: CYANOCOBALAMIN   Take 1,000 mcg by mouth daily.           Follow-up Information    Follow up with Laverda Page, MD in 2 weeks.   Contact information:   [address] [place], [place] 11/18/2011, 8:37 AM

## 2011-11-18 NOTE — Plan of Care (Signed)
Problem: Phase I Progression Outcomes Goal: Vascular site scale level 0 - I Vascular Site Scale Level 0: No bruising/bleeding/hematoma Level I (Mild): Bruising/Ecchymosis, minimal bleeding/ooozing, palpable hematoma < 3 cm Level II (Moderate): Bleeding not affecting hemodynamic parameters, pseudoaneurysm, palpable hematoma > 3 cm  Outcome: Completed/Met Date Met:  11/18/11 Some oozing

## 2011-11-20 DIAGNOSIS — C61 Malignant neoplasm of prostate: Secondary | ICD-10-CM | POA: Diagnosis not present

## 2011-11-20 DIAGNOSIS — D4959 Neoplasm of unspecified behavior of other genitourinary organ: Secondary | ICD-10-CM | POA: Diagnosis not present

## 2011-11-20 DIAGNOSIS — N529 Male erectile dysfunction, unspecified: Secondary | ICD-10-CM | POA: Diagnosis not present

## 2011-11-26 DIAGNOSIS — I2510 Atherosclerotic heart disease of native coronary artery without angina pectoris: Secondary | ICD-10-CM | POA: Diagnosis not present

## 2011-11-26 DIAGNOSIS — Q211 Atrial septal defect: Secondary | ICD-10-CM | POA: Diagnosis not present

## 2011-11-26 DIAGNOSIS — I6350 Cerebral infarction due to unspecified occlusion or stenosis of unspecified cerebral artery: Secondary | ICD-10-CM | POA: Diagnosis not present

## 2011-11-26 DIAGNOSIS — I10 Essential (primary) hypertension: Secondary | ICD-10-CM | POA: Diagnosis not present

## 2011-12-02 DIAGNOSIS — I714 Abdominal aortic aneurysm, without rupture: Secondary | ICD-10-CM | POA: Diagnosis not present

## 2011-12-02 DIAGNOSIS — K449 Diaphragmatic hernia without obstruction or gangrene: Secondary | ICD-10-CM | POA: Diagnosis not present

## 2011-12-02 DIAGNOSIS — D4959 Neoplasm of unspecified behavior of other genitourinary organ: Secondary | ICD-10-CM | POA: Diagnosis not present

## 2011-12-02 DIAGNOSIS — K5730 Diverticulosis of large intestine without perforation or abscess without bleeding: Secondary | ICD-10-CM | POA: Diagnosis not present

## 2011-12-03 DIAGNOSIS — I10 Essential (primary) hypertension: Secondary | ICD-10-CM | POA: Diagnosis not present

## 2011-12-03 DIAGNOSIS — Q211 Atrial septal defect: Secondary | ICD-10-CM | POA: Diagnosis not present

## 2011-12-03 DIAGNOSIS — I6350 Cerebral infarction due to unspecified occlusion or stenosis of unspecified cerebral artery: Secondary | ICD-10-CM | POA: Diagnosis not present

## 2011-12-03 DIAGNOSIS — I2510 Atherosclerotic heart disease of native coronary artery without angina pectoris: Secondary | ICD-10-CM | POA: Diagnosis not present

## 2012-03-21 ENCOUNTER — Other Ambulatory Visit: Payer: Self-pay | Admitting: Gastroenterology

## 2012-03-21 DIAGNOSIS — K5730 Diverticulosis of large intestine without perforation or abscess without bleeding: Secondary | ICD-10-CM | POA: Diagnosis not present

## 2012-03-21 DIAGNOSIS — K5190 Ulcerative colitis, unspecified, without complications: Secondary | ICD-10-CM | POA: Diagnosis not present

## 2012-03-21 DIAGNOSIS — D126 Benign neoplasm of colon, unspecified: Secondary | ICD-10-CM | POA: Diagnosis not present

## 2012-04-13 ENCOUNTER — Emergency Department (HOSPITAL_COMMUNITY): Payer: Medicare Other

## 2012-04-13 ENCOUNTER — Emergency Department (HOSPITAL_COMMUNITY)
Admission: EM | Admit: 2012-04-13 | Discharge: 2012-04-13 | Disposition: A | Discharge status: Home / Self Care | Payer: Medicare Other | Attending: Emergency Medicine | Admitting: Emergency Medicine

## 2012-04-13 ENCOUNTER — Encounter (HOSPITAL_COMMUNITY): Payer: Self-pay | Admitting: *Deleted

## 2012-04-13 DIAGNOSIS — J9819 Other pulmonary collapse: Secondary | ICD-10-CM | POA: Diagnosis not present

## 2012-04-13 DIAGNOSIS — J984 Other disorders of lung: Secondary | ICD-10-CM | POA: Diagnosis not present

## 2012-04-13 DIAGNOSIS — I252 Old myocardial infarction: Secondary | ICD-10-CM | POA: Diagnosis not present

## 2012-04-13 DIAGNOSIS — R509 Fever, unspecified: Secondary | ICD-10-CM | POA: Insufficient documentation

## 2012-04-13 DIAGNOSIS — R0602 Shortness of breath: Secondary | ICD-10-CM | POA: Diagnosis not present

## 2012-04-13 DIAGNOSIS — E0590 Thyrotoxicosis, unspecified without thyrotoxic crisis or storm: Secondary | ICD-10-CM | POA: Diagnosis not present

## 2012-04-13 DIAGNOSIS — Z87891 Personal history of nicotine dependence: Secondary | ICD-10-CM | POA: Diagnosis not present

## 2012-04-13 DIAGNOSIS — Z951 Presence of aortocoronary bypass graft: Secondary | ICD-10-CM | POA: Insufficient documentation

## 2012-04-13 DIAGNOSIS — Z7982 Long term (current) use of aspirin: Secondary | ICD-10-CM | POA: Insufficient documentation

## 2012-04-13 DIAGNOSIS — Z8673 Personal history of transient ischemic attack (TIA), and cerebral infarction without residual deficits: Secondary | ICD-10-CM | POA: Diagnosis not present

## 2012-04-13 DIAGNOSIS — I10 Essential (primary) hypertension: Secondary | ICD-10-CM | POA: Diagnosis not present

## 2012-04-13 DIAGNOSIS — D510 Vitamin B12 deficiency anemia due to intrinsic factor deficiency: Secondary | ICD-10-CM | POA: Diagnosis not present

## 2012-04-13 DIAGNOSIS — E785 Hyperlipidemia, unspecified: Secondary | ICD-10-CM | POA: Diagnosis not present

## 2012-04-13 DIAGNOSIS — Z8546 Personal history of malignant neoplasm of prostate: Secondary | ICD-10-CM | POA: Insufficient documentation

## 2012-04-13 DIAGNOSIS — K5190 Ulcerative colitis, unspecified, without complications: Secondary | ICD-10-CM | POA: Diagnosis not present

## 2012-04-13 LAB — URINALYSIS, ROUTINE W REFLEX MICROSCOPIC
Bilirubin Urine: NEGATIVE
Glucose, UA: NEGATIVE mg/dL
Ketones, ur: NEGATIVE mg/dL
Nitrite: NEGATIVE
Protein, ur: NEGATIVE mg/dL
pH: 5.5 (ref 5.0–8.0)

## 2012-04-13 LAB — CBC
MCH: 33.6 pg (ref 26.0–34.0)
MCHC: 34.3 g/dL (ref 30.0–36.0)
MCV: 98.2 fL (ref 78.0–100.0)
Platelets: 136 10*3/uL — ABNORMAL LOW (ref 150–400)
RBC: 3.3 MIL/uL — ABNORMAL LOW (ref 4.22–5.81)

## 2012-04-13 LAB — POCT I-STAT, CHEM 8
BUN: 18 mg/dL (ref 6–23)
Calcium, Ion: 1.16 mmol/L (ref 1.13–1.30)
Chloride: 103 mEq/L (ref 96–112)
Glucose, Bld: 107 mg/dL — ABNORMAL HIGH (ref 70–99)
TCO2: 22 mmol/L (ref 0–100)

## 2012-04-13 MED ORDER — ACETAMINOPHEN 325 MG PO TABS
650.0000 mg | ORAL_TABLET | Freq: Once | ORAL | Status: AC
  Administered 2012-04-13: 650 mg via ORAL

## 2012-04-13 MED ORDER — ACETAMINOPHEN 325 MG PO TABS
ORAL_TABLET | ORAL | Status: AC
  Filled 2012-04-13: qty 2

## 2012-04-13 NOTE — ED Provider Notes (Signed)
History     CSN: [id number]  Arrival date & time 04/13/12  [id number]   First MD Initiated Contact with Patient 04/13/12 1230      Chief Complaint  Patient presents with  . Fever    (Consider location/radiation/quality/duration/timing/severity/associated sxs/prior treatment) HPI Comments: Patient reports that he began having a fever yesterday.  Wife is unsure how high his temperature has been because her thermometer was broken.  Fever does respond to Tylenol.  Upon arrival patient's oral temperature was 101.4.  He was given Tylenol, which brought the fever down to 98.1.  He is also complaining of increased fatigue over the past couple of days.  PMH significant for UC, HTN, CVA, Pernicious Anemia, MI, and Prostate Cancer.  Wife reports that he was diagnosed with Prostate Cancer in 2003 and completed radiation several years ago.    Patient is a 71 y.o. male presenting with fever. The history is provided by the patient and the spouse.  Fever Primary symptoms of the febrile illness include fatigue. Primary symptoms do not include fever, headaches, cough, wheezing, shortness of breath, abdominal pain, nausea, vomiting, diarrhea, dysuria, altered mental status, myalgias or rash. Primary symptoms comment: Denies dysuria    Past Medical History  Diagnosis Date  . Hypertension   . Hyperlipidemia   . Stroke 2007; 12/2010    "light"; denies residuals  . Ulcerative colitis   . Pernicious anemia   . Hyperthyroidism   . Hx of CABG   . MI (myocardial infarction) 03/2006  . Blood transfusion   . Lower GI bleeding 1987    "bleeding ulcers; got 6 pints of blood"  . Arthritis   . Prostate cancer ~2005    S/P radiation; seed implants    Past Surgical History  Procedure Date  . Patent foramen ovale closure 11/17/11  . Coronary artery bypass graft 02/2006    CABG X 5  . Gastric resection ~ 1987    "had ~ 90% of my stomach removed"  . 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 Knee arthroscopy    bilaterally; "long long time ago" (11/17/11)    History reviewed. No pertinent family history.  History  Substance Use Topics  . Smoking status: Former Smoker -- 0.5 packs/day for 8 years    Quit date: 08/31/1965  . Smokeless tobacco: Never Used  . Alcohol Use: No      Review of Systems  Constitutional: Positive for fatigue. Negative for fever and chills.  HENT: Negative for ear pain, sore throat, neck pain and neck stiffness.   Respiratory: Negative for cough, shortness of breath and wheezing.   Cardiovascular: Negative for chest pain.  Gastrointestinal: Negative for nausea, vomiting, abdominal pain and diarrhea.  Genitourinary: Negative for dysuria, frequency, hematuria and decreased urine volume.  Musculoskeletal: Negative for myalgias.  Skin: Negative for rash.  Neurological: Negative for dizziness, syncope, weakness, light-headedness and headaches.  Psychiatric/Behavioral: Negative for confusion and altered mental status.    Allergies  Review of patient's allergies indicates no known allergies.  Home Medications   Current Outpatient Rx  Name Route Sig Dispense Refill  . ASPIRIN 81 MG PO TABS Oral Take 1 tablet (81 mg total) by mouth daily. 30 tablet   . AZATHIOPRINE 50 MG PO TABS Oral Take 150 mg by mouth daily.     . ESTER-C 1000-50 MG PO TABS Oral Take by mouth.    Marland Kitchen CARVEDILOL 12.5 MG PO TABS Oral Take 6.25 mg by mouth 2 (two) times daily  with a meal.    . CLOPIDOGREL BISULFATE 75 MG PO TABS Oral Take 75 mg by mouth Daily.    Marland Kitchen FERROUS SULFATE 325 (65 FE) MG PO TABS Oral Take 325 mg by mouth daily with breakfast.     . OMEGA-3 FATTY ACIDS 1000 MG PO CAPS Oral Take 1,200 mg by mouth daily.    Marland Kitchen FOLIC ACID 1 MG PO TABS Oral Take 1 mg by mouth daily.    Marland Kitchen LISINOPRIL 10 MG PO TABS Oral Take 10 mg by mouth Daily.    . MULTIVITAMINS PO CAPS Oral Take 1 capsule by mouth daily.    Marland Kitchen PRAVASTATIN SODIUM 40 MG PO TABS Oral Take 80 mg by mouth Daily.     . SULFASALAZINE 500  MG PO TABS Oral Take 500 mg by mouth 4 (four) times daily.     Marland Kitchen VITAMIN B-12 1000 MCG PO TABS Oral Take 1,000 mcg by mouth daily.      BP 117/59  Pulse 89  Temp 98.1 F (36.7 C) (Oral)  Resp 16  SpO2 94%  Physical Exam  Nursing note and vitals reviewed. Constitutional: He is oriented to person, place, and time. He appears well-developed and well-nourished. No distress.  HENT:  Head: Normocephalic and atraumatic.  Nose: Nose normal.  Mouth/Throat: Oropharynx is clear and moist.  Neck: Normal range of motion. Neck supple.  Cardiovascular: Normal rate, regular rhythm and normal heart sounds.   Pulmonary/Chest: Effort normal and breath sounds normal. No respiratory distress. He has no wheezes. He has no rales.  Abdominal: Soft. He exhibits no mass. There is no tenderness. There is no rebound and no guarding.  Musculoskeletal: Normal range of motion.  Neurological: He is alert and oriented to person, place, and time.  Skin: Skin is warm and dry. No rash noted. He is not diaphoretic.  Psychiatric: He has a normal mood and affect.    ED Course  Procedures (including critical care time)  Labs Reviewed  CBC - Abnormal; Notable for the following:    WBC 2.3 (*)     RBC 3.30 (*)     Hemoglobin 11.1 (*)     HCT 32.4 (*)     Platelets 136 (*)     All other components within normal limits  POCT I-STAT, CHEM 8 - Abnormal; Notable for the following:    Creatinine, Ser 1.40 (*)     Glucose, Bld 107 (*)     Hemoglobin 11.2 (*)     HCT 33.0 (*)     All other components within normal limits  URINALYSIS, ROUTINE W REFLEX MICROSCOPIC   Dg Chest 2 View  04/13/2012  *RADIOLOGY REPORT*  Clinical Data: Shortness of breath.  Fever.  CHEST - 2 VIEW  Comparison: CT chest 11/02/2011, 05/08/2011.  Two-view chest x-ray 01/16/2011, 04/22/2006.  Findings: Prior sternotomy for CABG.  Cardiac silhouette upper normal in size to slightly enlarged but stable.  Thoracic aorta tortuous and atherosclerotic,  unchanged.  Hilar and mediastinal contours otherwise unremarkable.  Stable chronic elevation of the right hemidiaphragm and associated chronic scar/atelectasis in the right lower lobe.  Lungs otherwise clear.  Pulmonary vascularity normal.  No pleural effusions.  Degenerative changes involving the thoracic spine.  IMPRESSION: Stable borderline heart size.  No acute cardiopulmonary disease. Stable chronic elevation of the right hemidiaphragm and associated scar/atelectasis in the right lower lobe.  Original Report Authenticated By: Deniece Portela, M.D.     1. Fever     Patient discussed  with Dr. Canary Brim who also evaluated the patient.    MDM  Patient presenting today with fever.  Fever responded to Tylenol.  Patient with no other complaints.  No acute findings on CXR.  UA negative for infection.  Therefore, feel that patient can be discharged home.  Patient and wife in agreement with the plan.  Return precautions have been discussed.          Sherlyn Lees [place], PA-C 04/13/12 2019

## 2012-04-13 NOTE — ED Notes (Signed)
Reports having fever and chills that started on Monday night. Denies recent cough, denies n/v/d. No acute distress noted at triage, temp 101.4, mask on pt at triage.

## 2012-04-15 NOTE — ED Provider Notes (Signed)
Medical screening examination/treatment/procedure(s) were performed by non-physician practitioner and as supervising physician I was immediately available for consultation/collaboration.  Threasa Beards, MD 04/15/12 [phone]

## 2012-04-18 DIAGNOSIS — A403 Sepsis due to Streptococcus pneumoniae: Secondary | ICD-10-CM | POA: Diagnosis not present

## 2012-04-18 DIAGNOSIS — R5383 Other fatigue: Secondary | ICD-10-CM | POA: Diagnosis not present

## 2012-04-18 DIAGNOSIS — E785 Hyperlipidemia, unspecified: Secondary | ICD-10-CM | POA: Diagnosis not present

## 2012-04-18 DIAGNOSIS — I951 Orthostatic hypotension: Secondary | ICD-10-CM | POA: Diagnosis not present

## 2012-04-18 DIAGNOSIS — R5381 Other malaise: Secondary | ICD-10-CM | POA: Diagnosis not present

## 2012-04-18 DIAGNOSIS — E860 Dehydration: Secondary | ICD-10-CM | POA: Diagnosis not present

## 2012-04-18 DIAGNOSIS — E291 Testicular hypofunction: Secondary | ICD-10-CM | POA: Diagnosis not present

## 2012-04-18 DIAGNOSIS — D518 Other vitamin B12 deficiency anemias: Secondary | ICD-10-CM | POA: Diagnosis not present

## 2012-04-18 DIAGNOSIS — A779 Spotted fever, unspecified: Secondary | ICD-10-CM | POA: Diagnosis not present

## 2012-04-18 DIAGNOSIS — C61 Malignant neoplasm of prostate: Secondary | ICD-10-CM | POA: Diagnosis not present

## 2012-04-19 DIAGNOSIS — A403 Sepsis due to Streptococcus pneumoniae: Secondary | ICD-10-CM | POA: Diagnosis not present

## 2012-04-19 DIAGNOSIS — E860 Dehydration: Secondary | ICD-10-CM | POA: Diagnosis not present

## 2012-04-19 DIAGNOSIS — A849 Tick-borne viral encephalitis, unspecified: Secondary | ICD-10-CM | POA: Diagnosis not present

## 2012-04-19 DIAGNOSIS — A779 Spotted fever, unspecified: Secondary | ICD-10-CM | POA: Diagnosis not present

## 2012-04-20 DIAGNOSIS — A403 Sepsis due to Streptococcus pneumoniae: Secondary | ICD-10-CM | POA: Diagnosis not present

## 2012-04-20 DIAGNOSIS — A849 Tick-borne viral encephalitis, unspecified: Secondary | ICD-10-CM | POA: Diagnosis not present

## 2012-04-20 DIAGNOSIS — A779 Spotted fever, unspecified: Secondary | ICD-10-CM | POA: Diagnosis not present

## 2012-04-20 DIAGNOSIS — E860 Dehydration: Secondary | ICD-10-CM | POA: Diagnosis not present

## 2012-04-20 DIAGNOSIS — R5383 Other fatigue: Secondary | ICD-10-CM | POA: Diagnosis not present

## 2012-04-21 DIAGNOSIS — A779 Spotted fever, unspecified: Secondary | ICD-10-CM | POA: Diagnosis not present

## 2012-04-21 DIAGNOSIS — E860 Dehydration: Secondary | ICD-10-CM | POA: Diagnosis not present

## 2012-04-21 DIAGNOSIS — A849 Tick-borne viral encephalitis, unspecified: Secondary | ICD-10-CM | POA: Diagnosis not present

## 2012-04-21 DIAGNOSIS — A403 Sepsis due to Streptococcus pneumoniae: Secondary | ICD-10-CM | POA: Diagnosis not present

## 2012-04-26 ENCOUNTER — Other Ambulatory Visit: Payer: Self-pay | Admitting: Family Medicine

## 2012-04-26 ENCOUNTER — Ambulatory Visit
Admission: RE | Admit: 2012-04-26 | Discharge: 2012-04-26 | Disposition: A | Discharge status: Home / Self Care | Payer: Medicare Other | Source: Ambulatory Visit | Attending: Family Medicine | Admitting: Family Medicine

## 2012-04-26 DIAGNOSIS — N19 Unspecified kidney failure: Secondary | ICD-10-CM

## 2012-04-26 DIAGNOSIS — A419 Sepsis, unspecified organism: Secondary | ICD-10-CM

## 2012-04-26 DIAGNOSIS — N281 Cyst of kidney, acquired: Secondary | ICD-10-CM | POA: Diagnosis not present

## 2012-04-27 DIAGNOSIS — E860 Dehydration: Secondary | ICD-10-CM | POA: Diagnosis not present

## 2012-04-27 DIAGNOSIS — A849 Tick-borne viral encephalitis, unspecified: Secondary | ICD-10-CM | POA: Diagnosis not present

## 2012-04-27 DIAGNOSIS — A403 Sepsis due to Streptococcus pneumoniae: Secondary | ICD-10-CM | POA: Diagnosis not present

## 2012-04-27 DIAGNOSIS — A779 Spotted fever, unspecified: Secondary | ICD-10-CM | POA: Diagnosis not present

## 2012-05-04 DIAGNOSIS — I509 Heart failure, unspecified: Secondary | ICD-10-CM | POA: Diagnosis not present

## 2012-05-04 DIAGNOSIS — A049 Bacterial intestinal infection, unspecified: Secondary | ICD-10-CM | POA: Diagnosis not present

## 2012-05-04 DIAGNOSIS — A849 Tick-borne viral encephalitis, unspecified: Secondary | ICD-10-CM | POA: Diagnosis not present

## 2012-05-04 DIAGNOSIS — E785 Hyperlipidemia, unspecified: Secondary | ICD-10-CM | POA: Diagnosis not present

## 2012-05-04 DIAGNOSIS — D518 Other vitamin B12 deficiency anemias: Secondary | ICD-10-CM | POA: Diagnosis not present

## 2012-05-04 DIAGNOSIS — T675XXA Heat exhaustion, unspecified, initial encounter: Secondary | ICD-10-CM | POA: Diagnosis not present

## 2012-05-05 DIAGNOSIS — A779 Spotted fever, unspecified: Secondary | ICD-10-CM | POA: Diagnosis not present

## 2012-05-05 DIAGNOSIS — A849 Tick-borne viral encephalitis, unspecified: Secondary | ICD-10-CM | POA: Diagnosis not present

## 2012-05-05 DIAGNOSIS — D518 Other vitamin B12 deficiency anemias: Secondary | ICD-10-CM | POA: Diagnosis not present

## 2012-05-09 ENCOUNTER — Telehealth: Payer: Self-pay | Admitting: Hematology & Oncology

## 2012-05-09 NOTE — Telephone Encounter (Signed)
Pt cx 10-2 and 10-9 appointments didn't want to reschedule

## 2012-06-01 ENCOUNTER — Other Ambulatory Visit: Payer: Medicare Other | Admitting: Lab

## 2012-06-01 ENCOUNTER — Ambulatory Visit (HOSPITAL_BASED_OUTPATIENT_CLINIC_OR_DEPARTMENT_OTHER): Payer: Medicare Other

## 2012-06-01 DIAGNOSIS — Q211 Atrial septal defect: Secondary | ICD-10-CM | POA: Diagnosis not present

## 2012-06-06 DIAGNOSIS — I259 Chronic ischemic heart disease, unspecified: Secondary | ICD-10-CM | POA: Diagnosis not present

## 2012-06-06 DIAGNOSIS — D518 Other vitamin B12 deficiency anemias: Secondary | ICD-10-CM | POA: Diagnosis not present

## 2012-06-06 DIAGNOSIS — Z23 Encounter for immunization: Secondary | ICD-10-CM | POA: Diagnosis not present

## 2012-06-06 DIAGNOSIS — A849 Tick-borne viral encephalitis, unspecified: Secondary | ICD-10-CM | POA: Diagnosis not present

## 2012-06-06 DIAGNOSIS — I6350 Cerebral infarction due to unspecified occlusion or stenosis of unspecified cerebral artery: Secondary | ICD-10-CM | POA: Diagnosis not present

## 2012-06-08 ENCOUNTER — Ambulatory Visit: Payer: Medicare Other | Admitting: Hematology & Oncology

## 2012-06-09 DIAGNOSIS — E7800 Pure hypercholesterolemia, unspecified: Secondary | ICD-10-CM | POA: Diagnosis not present

## 2012-06-09 DIAGNOSIS — A049 Bacterial intestinal infection, unspecified: Secondary | ICD-10-CM | POA: Diagnosis not present

## 2012-06-09 DIAGNOSIS — I259 Chronic ischemic heart disease, unspecified: Secondary | ICD-10-CM | POA: Diagnosis not present

## 2012-06-09 DIAGNOSIS — A849 Tick-borne viral encephalitis, unspecified: Secondary | ICD-10-CM | POA: Diagnosis not present

## 2012-06-09 DIAGNOSIS — I10 Essential (primary) hypertension: Secondary | ICD-10-CM | POA: Diagnosis not present

## 2012-06-09 DIAGNOSIS — D518 Other vitamin B12 deficiency anemias: Secondary | ICD-10-CM | POA: Diagnosis not present

## 2012-06-09 DIAGNOSIS — D500 Iron deficiency anemia secondary to blood loss (chronic): Secondary | ICD-10-CM | POA: Diagnosis not present

## 2012-06-09 DIAGNOSIS — I2510 Atherosclerotic heart disease of native coronary artery without angina pectoris: Secondary | ICD-10-CM | POA: Diagnosis not present

## 2012-06-09 DIAGNOSIS — Z951 Presence of aortocoronary bypass graft: Secondary | ICD-10-CM | POA: Diagnosis not present

## 2012-06-09 DIAGNOSIS — E785 Hyperlipidemia, unspecified: Secondary | ICD-10-CM | POA: Diagnosis not present

## 2012-07-11 DIAGNOSIS — K5190 Ulcerative colitis, unspecified, without complications: Secondary | ICD-10-CM | POA: Diagnosis not present

## 2012-07-13 DIAGNOSIS — C61 Malignant neoplasm of prostate: Secondary | ICD-10-CM | POA: Diagnosis not present

## 2012-07-13 DIAGNOSIS — D518 Other vitamin B12 deficiency anemias: Secondary | ICD-10-CM | POA: Diagnosis not present

## 2012-07-13 DIAGNOSIS — IMO0002 Reserved for concepts with insufficient information to code with codable children: Secondary | ICD-10-CM | POA: Diagnosis not present

## 2012-07-13 DIAGNOSIS — E291 Testicular hypofunction: Secondary | ICD-10-CM | POA: Diagnosis not present

## 2012-07-25 DIAGNOSIS — L570 Actinic keratosis: Secondary | ICD-10-CM | POA: Diagnosis not present

## 2012-07-25 DIAGNOSIS — C44519 Basal cell carcinoma of skin of other part of trunk: Secondary | ICD-10-CM | POA: Diagnosis not present

## 2012-07-25 DIAGNOSIS — C44319 Basal cell carcinoma of skin of other parts of face: Secondary | ICD-10-CM | POA: Diagnosis not present

## 2012-08-10 DIAGNOSIS — C44319 Basal cell carcinoma of skin of other parts of face: Secondary | ICD-10-CM | POA: Diagnosis not present

## 2012-08-16 DIAGNOSIS — D518 Other vitamin B12 deficiency anemias: Secondary | ICD-10-CM | POA: Diagnosis not present

## 2012-08-16 DIAGNOSIS — I714 Abdominal aortic aneurysm, without rupture: Secondary | ICD-10-CM | POA: Diagnosis not present

## 2012-08-16 DIAGNOSIS — C61 Malignant neoplasm of prostate: Secondary | ICD-10-CM | POA: Diagnosis not present

## 2012-08-26 DIAGNOSIS — Q211 Atrial septal defect: Secondary | ICD-10-CM | POA: Diagnosis not present

## 2012-08-26 DIAGNOSIS — E7800 Pure hypercholesterolemia, unspecified: Secondary | ICD-10-CM | POA: Diagnosis not present

## 2012-09-06 DIAGNOSIS — C44611 Basal cell carcinoma of skin of unspecified upper limb, including shoulder: Secondary | ICD-10-CM | POA: Diagnosis not present

## 2012-09-15 DIAGNOSIS — C44611 Basal cell carcinoma of skin of unspecified upper limb, including shoulder: Secondary | ICD-10-CM | POA: Diagnosis not present

## 2012-09-29 DIAGNOSIS — D518 Other vitamin B12 deficiency anemias: Secondary | ICD-10-CM | POA: Diagnosis not present

## 2012-09-29 DIAGNOSIS — C61 Malignant neoplasm of prostate: Secondary | ICD-10-CM | POA: Diagnosis not present

## 2012-11-18 DIAGNOSIS — D4959 Neoplasm of unspecified behavior of other genitourinary organ: Secondary | ICD-10-CM | POA: Diagnosis not present

## 2012-11-18 DIAGNOSIS — N281 Cyst of kidney, acquired: Secondary | ICD-10-CM | POA: Diagnosis not present

## 2012-11-18 DIAGNOSIS — I714 Abdominal aortic aneurysm, without rupture: Secondary | ICD-10-CM | POA: Diagnosis not present

## 2012-11-21 DIAGNOSIS — C61 Malignant neoplasm of prostate: Secondary | ICD-10-CM | POA: Diagnosis not present

## 2012-11-21 DIAGNOSIS — N529 Male erectile dysfunction, unspecified: Secondary | ICD-10-CM | POA: Diagnosis not present

## 2012-11-21 DIAGNOSIS — D4959 Neoplasm of unspecified behavior of other genitourinary organ: Secondary | ICD-10-CM | POA: Diagnosis not present

## 2012-12-01 DIAGNOSIS — Z79899 Other long term (current) drug therapy: Secondary | ICD-10-CM | POA: Diagnosis not present

## 2012-12-01 DIAGNOSIS — E7800 Pure hypercholesterolemia, unspecified: Secondary | ICD-10-CM | POA: Diagnosis not present

## 2012-12-05 DIAGNOSIS — K5100 Ulcerative (chronic) pancolitis without complications: Secondary | ICD-10-CM | POA: Diagnosis not present

## 2012-12-05 DIAGNOSIS — D518 Other vitamin B12 deficiency anemias: Secondary | ICD-10-CM | POA: Diagnosis not present

## 2012-12-05 DIAGNOSIS — C61 Malignant neoplasm of prostate: Secondary | ICD-10-CM | POA: Diagnosis not present

## 2012-12-07 DIAGNOSIS — Z951 Presence of aortocoronary bypass graft: Secondary | ICD-10-CM | POA: Diagnosis not present

## 2012-12-07 DIAGNOSIS — I10 Essential (primary) hypertension: Secondary | ICD-10-CM | POA: Diagnosis not present

## 2012-12-07 DIAGNOSIS — I2510 Atherosclerotic heart disease of native coronary artery without angina pectoris: Secondary | ICD-10-CM | POA: Diagnosis not present

## 2012-12-07 DIAGNOSIS — E7800 Pure hypercholesterolemia, unspecified: Secondary | ICD-10-CM | POA: Diagnosis not present

## 2013-02-22 DIAGNOSIS — I714 Abdominal aortic aneurysm, without rupture: Secondary | ICD-10-CM | POA: Diagnosis not present

## 2013-02-22 DIAGNOSIS — K5100 Ulcerative (chronic) pancolitis without complications: Secondary | ICD-10-CM | POA: Diagnosis not present

## 2013-02-22 DIAGNOSIS — E785 Hyperlipidemia, unspecified: Secondary | ICD-10-CM | POA: Diagnosis not present

## 2013-02-22 DIAGNOSIS — R5381 Other malaise: Secondary | ICD-10-CM | POA: Diagnosis not present

## 2013-02-22 DIAGNOSIS — C61 Malignant neoplasm of prostate: Secondary | ICD-10-CM | POA: Diagnosis not present

## 2013-02-22 DIAGNOSIS — D510 Vitamin B12 deficiency anemia due to intrinsic factor deficiency: Secondary | ICD-10-CM | POA: Diagnosis not present

## 2013-03-28 DIAGNOSIS — E7800 Pure hypercholesterolemia, unspecified: Secondary | ICD-10-CM | POA: Diagnosis not present

## 2013-04-10 DIAGNOSIS — M1990 Unspecified osteoarthritis, unspecified site: Secondary | ICD-10-CM | POA: Diagnosis not present

## 2013-04-10 DIAGNOSIS — D518 Other vitamin B12 deficiency anemias: Secondary | ICD-10-CM | POA: Diagnosis not present

## 2013-04-27 DIAGNOSIS — J330 Polyp of nasal cavity: Secondary | ICD-10-CM | POA: Diagnosis not present

## 2013-04-27 DIAGNOSIS — J339 Nasal polyp, unspecified: Secondary | ICD-10-CM | POA: Diagnosis not present

## 2013-05-18 DIAGNOSIS — IMO0002 Reserved for concepts with insufficient information to code with codable children: Secondary | ICD-10-CM | POA: Diagnosis not present

## 2013-05-18 DIAGNOSIS — Z23 Encounter for immunization: Secondary | ICD-10-CM | POA: Diagnosis not present

## 2013-05-18 DIAGNOSIS — I714 Abdominal aortic aneurysm, without rupture: Secondary | ICD-10-CM | POA: Diagnosis not present

## 2013-05-18 DIAGNOSIS — C61 Malignant neoplasm of prostate: Secondary | ICD-10-CM | POA: Diagnosis not present

## 2013-05-18 DIAGNOSIS — I509 Heart failure, unspecified: Secondary | ICD-10-CM | POA: Diagnosis not present

## 2013-05-18 DIAGNOSIS — D518 Other vitamin B12 deficiency anemias: Secondary | ICD-10-CM | POA: Diagnosis not present

## 2013-06-08 DIAGNOSIS — E7800 Pure hypercholesterolemia, unspecified: Secondary | ICD-10-CM | POA: Diagnosis not present

## 2013-06-08 DIAGNOSIS — I2510 Atherosclerotic heart disease of native coronary artery without angina pectoris: Secondary | ICD-10-CM | POA: Diagnosis not present

## 2013-06-20 DIAGNOSIS — I259 Chronic ischemic heart disease, unspecified: Secondary | ICD-10-CM | POA: Diagnosis not present

## 2013-06-20 DIAGNOSIS — D518 Other vitamin B12 deficiency anemias: Secondary | ICD-10-CM | POA: Diagnosis not present

## 2013-06-20 DIAGNOSIS — G63 Polyneuropathy in diseases classified elsewhere: Secondary | ICD-10-CM | POA: Diagnosis not present

## 2013-06-20 DIAGNOSIS — Z23 Encounter for immunization: Secondary | ICD-10-CM | POA: Diagnosis not present

## 2013-07-06 DIAGNOSIS — K5190 Ulcerative colitis, unspecified, without complications: Secondary | ICD-10-CM | POA: Diagnosis not present

## 2013-07-31 DIAGNOSIS — IMO0002 Reserved for concepts with insufficient information to code with codable children: Secondary | ICD-10-CM | POA: Diagnosis not present

## 2013-07-31 DIAGNOSIS — D518 Other vitamin B12 deficiency anemias: Secondary | ICD-10-CM | POA: Diagnosis not present

## 2013-07-31 DIAGNOSIS — I714 Abdominal aortic aneurysm, without rupture, unspecified: Secondary | ICD-10-CM | POA: Diagnosis not present

## 2013-08-03 DIAGNOSIS — H2510 Age-related nuclear cataract, unspecified eye: Secondary | ICD-10-CM | POA: Diagnosis not present

## 2013-08-03 DIAGNOSIS — H40019 Open angle with borderline findings, low risk, unspecified eye: Secondary | ICD-10-CM | POA: Diagnosis not present

## 2013-10-10 DIAGNOSIS — H40019 Open angle with borderline findings, low risk, unspecified eye: Secondary | ICD-10-CM | POA: Diagnosis not present

## 2013-11-15 DIAGNOSIS — N529 Male erectile dysfunction, unspecified: Secondary | ICD-10-CM | POA: Diagnosis not present

## 2013-11-15 DIAGNOSIS — D4959 Neoplasm of unspecified behavior of other genitourinary organ: Secondary | ICD-10-CM | POA: Diagnosis not present

## 2013-11-15 DIAGNOSIS — C61 Malignant neoplasm of prostate: Secondary | ICD-10-CM | POA: Diagnosis not present

## 2013-12-11 DIAGNOSIS — Q2111 Secundum atrial septal defect: Secondary | ICD-10-CM | POA: Diagnosis not present

## 2013-12-11 DIAGNOSIS — E7800 Pure hypercholesterolemia, unspecified: Secondary | ICD-10-CM | POA: Diagnosis not present

## 2013-12-11 DIAGNOSIS — I10 Essential (primary) hypertension: Secondary | ICD-10-CM | POA: Diagnosis not present

## 2013-12-11 DIAGNOSIS — I2510 Atherosclerotic heart disease of native coronary artery without angina pectoris: Secondary | ICD-10-CM | POA: Diagnosis not present

## 2013-12-11 DIAGNOSIS — D518 Other vitamin B12 deficiency anemias: Secondary | ICD-10-CM | POA: Diagnosis not present

## 2013-12-11 DIAGNOSIS — D509 Iron deficiency anemia, unspecified: Secondary | ICD-10-CM | POA: Diagnosis not present

## 2013-12-11 DIAGNOSIS — Q211 Atrial septal defect: Secondary | ICD-10-CM | POA: Diagnosis not present

## 2013-12-11 DIAGNOSIS — R0989 Other specified symptoms and signs involving the circulatory and respiratory systems: Secondary | ICD-10-CM | POA: Diagnosis not present

## 2013-12-25 DIAGNOSIS — J069 Acute upper respiratory infection, unspecified: Secondary | ICD-10-CM | POA: Diagnosis not present

## 2013-12-25 DIAGNOSIS — I2510 Atherosclerotic heart disease of native coronary artery without angina pectoris: Secondary | ICD-10-CM | POA: Diagnosis not present

## 2013-12-25 DIAGNOSIS — E538 Deficiency of other specified B group vitamins: Secondary | ICD-10-CM | POA: Diagnosis not present

## 2013-12-25 DIAGNOSIS — I6350 Cerebral infarction due to unspecified occlusion or stenosis of unspecified cerebral artery: Secondary | ICD-10-CM | POA: Diagnosis not present

## 2014-03-29 DIAGNOSIS — Z79899 Other long term (current) drug therapy: Secondary | ICD-10-CM | POA: Diagnosis not present

## 2014-03-29 DIAGNOSIS — E538 Deficiency of other specified B group vitamins: Secondary | ICD-10-CM | POA: Diagnosis not present

## 2014-03-29 DIAGNOSIS — I6350 Cerebral infarction due to unspecified occlusion or stenosis of unspecified cerebral artery: Secondary | ICD-10-CM | POA: Diagnosis not present

## 2014-03-29 DIAGNOSIS — Z87898 Personal history of other specified conditions: Secondary | ICD-10-CM | POA: Diagnosis not present

## 2014-03-29 DIAGNOSIS — E7800 Pure hypercholesterolemia, unspecified: Secondary | ICD-10-CM | POA: Diagnosis not present

## 2014-03-29 DIAGNOSIS — I2510 Atherosclerotic heart disease of native coronary artery without angina pectoris: Secondary | ICD-10-CM | POA: Diagnosis not present

## 2014-05-21 DIAGNOSIS — J330 Polyp of nasal cavity: Secondary | ICD-10-CM | POA: Diagnosis not present

## 2014-05-21 DIAGNOSIS — J309 Allergic rhinitis, unspecified: Secondary | ICD-10-CM | POA: Diagnosis not present

## 2014-05-21 DIAGNOSIS — H9319 Tinnitus, unspecified ear: Secondary | ICD-10-CM | POA: Diagnosis not present

## 2014-05-25 DIAGNOSIS — Z23 Encounter for immunization: Secondary | ICD-10-CM | POA: Diagnosis not present

## 2014-06-15 DIAGNOSIS — E780 Pure hypercholesterolemia: Secondary | ICD-10-CM | POA: Diagnosis not present

## 2014-06-15 DIAGNOSIS — I2510 Atherosclerotic heart disease of native coronary artery without angina pectoris: Secondary | ICD-10-CM | POA: Diagnosis not present

## 2014-06-15 DIAGNOSIS — I10 Essential (primary) hypertension: Secondary | ICD-10-CM | POA: Diagnosis not present

## 2014-07-03 DIAGNOSIS — H9313 Tinnitus, bilateral: Secondary | ICD-10-CM | POA: Diagnosis not present

## 2014-07-03 DIAGNOSIS — J339 Nasal polyp, unspecified: Secondary | ICD-10-CM | POA: Diagnosis not present

## 2014-07-03 DIAGNOSIS — H903 Sensorineural hearing loss, bilateral: Secondary | ICD-10-CM | POA: Diagnosis not present

## 2014-07-31 DIAGNOSIS — R55 Syncope and collapse: Secondary | ICD-10-CM | POA: Diagnosis not present

## 2014-07-31 DIAGNOSIS — I2510 Atherosclerotic heart disease of native coronary artery without angina pectoris: Secondary | ICD-10-CM | POA: Diagnosis not present

## 2014-08-09 ENCOUNTER — Encounter (HOSPITAL_COMMUNITY): Payer: Self-pay | Admitting: Cardiology

## 2014-09-04 DIAGNOSIS — Z6824 Body mass index (BMI) 24.0-24.9, adult: Secondary | ICD-10-CM | POA: Diagnosis not present

## 2014-09-04 DIAGNOSIS — G629 Polyneuropathy, unspecified: Secondary | ICD-10-CM | POA: Diagnosis not present

## 2014-09-04 DIAGNOSIS — R269 Unspecified abnormalities of gait and mobility: Secondary | ICD-10-CM | POA: Diagnosis not present

## 2014-09-04 DIAGNOSIS — Z9181 History of falling: Secondary | ICD-10-CM | POA: Diagnosis not present

## 2014-09-06 DIAGNOSIS — D485 Neoplasm of uncertain behavior of skin: Secondary | ICD-10-CM | POA: Diagnosis not present

## 2014-09-06 DIAGNOSIS — L820 Inflamed seborrheic keratosis: Secondary | ICD-10-CM | POA: Diagnosis not present

## 2014-09-14 ENCOUNTER — Encounter: Payer: Self-pay | Admitting: Diagnostic Neuroimaging

## 2014-09-14 ENCOUNTER — Ambulatory Visit (INDEPENDENT_AMBULATORY_CARE_PROVIDER_SITE_OTHER): Payer: Medicare Other | Admitting: Diagnostic Neuroimaging

## 2014-09-14 VITALS — BP 138/84 | HR 74 | Temp 97.6°F | Ht 69.0 in | Wt 170.8 lb

## 2014-09-14 DIAGNOSIS — G609 Hereditary and idiopathic neuropathy, unspecified: Secondary | ICD-10-CM | POA: Diagnosis not present

## 2014-09-14 DIAGNOSIS — M25569 Pain in unspecified knee: Secondary | ICD-10-CM

## 2014-09-14 DIAGNOSIS — R269 Unspecified abnormalities of gait and mobility: Secondary | ICD-10-CM | POA: Diagnosis not present

## 2014-09-14 DIAGNOSIS — M545 Low back pain, unspecified: Secondary | ICD-10-CM

## 2014-09-14 DIAGNOSIS — Z79899 Other long term (current) drug therapy: Secondary | ICD-10-CM | POA: Diagnosis not present

## 2014-09-14 NOTE — Progress Notes (Signed)
[facility]  PATIENT: Aaron Whitaker DOB: [birth year]  REFERRING CLINICIAN: Tobie Lords HISTORY FROM: patient and wife REASON FOR VISIT: new consult    HISTORICAL  CHIEF COMPLAINT:  Chief Complaint  Patient presents with  . Gait Problem    HISTORY OF PRESENT ILLNESS:   74 year old male with hypertension, hyperglycemia, coronary artery disease status post CABG, prostate cancer status post radiation therapy, stroke 2 affecting left leg and then left arm/hand strength, here for evaluation of gait and balance difficulty.  Over past 10 years patient has had intermittent lower extremity pains from knees down to feet. Over time he has been having more problems with cramps in his calves especially if he exerts himself. Since July 2015 he has been having more balance problems. He has fallen down a time since then. Around Christmas 2015, patient was playing golf, having trouble walking normally on the golf course. When he would start walking, his steps would speed up and then he would lose control. Patient has fallen towards the left side as well as backwards. No new tremor or shaking. No sudden onset focal neurologic symptoms. No new unilateral symptoms.    REVIEW OF SYSTEMS: Full 14 system review of systems performed and notable only for hearing loss ringing in ears moles allergies joint pain easy bruising easy bleeding restless legs.  ALLERGIES: No Known Allergies  HOME MEDICATIONS: Outpatient Prescriptions Prior to Visit  Medication Sig Dispense Refill  . carvedilol (COREG) 12.5 MG tablet Take 3.125 mg by mouth 2 (two) times daily with a meal.     . fish oil-omega-3 fatty acids 1000 MG capsule Take 1,200 mg by mouth daily.    . Multiple Vitamin (MULTIVITAMIN) capsule Take 1 capsule by mouth daily.    Marland Kitchen azaTHIOprine (IMURAN) 50 MG tablet Take 150 mg by mouth daily.     . clopidogrel (PLAVIX) 75 MG tablet Take 75 mg by mouth Daily.    . ferrous sulfate 325 (65 FE)  MG tablet Take 325 mg by mouth daily with breakfast.     . folic acid (FOLVITE) 1 MG tablet Take 1 mg by mouth daily.    Marland Kitchen lisinopril (PRINIVIL,ZESTRIL) 10 MG tablet Take 10 mg by mouth Daily.    . pravastatin (PRAVACHOL) 40 MG tablet Take 80 mg by mouth Daily.     Marland Kitchen sulfaSALAzine (AZULFIDINE) 500 MG tablet Take 500 mg by mouth 3 (three) times daily.     . vitamin B-12 (CYANOCOBALAMIN) 1000 MCG tablet Take 1,000 mcg by mouth daily.     No facility-administered medications prior to visit.    PAST MEDICAL HISTORY: Past Medical History  Diagnosis Date  . Hypertension   . Hyperlipidemia   . Stroke 2007; 12/2010    "light"; denies residuals  . Ulcerative colitis   . Pernicious anemia   . Hyperthyroidism   . Hx of CABG   . MI (myocardial infarction) 03/2006  . Blood transfusion   . Lower GI bleeding 1987    "bleeding ulcers; got 6 pints of blood"  . Arthritis   . Prostate cancer ~2005    S/P radiation; seed implants  . Heart disease   . Skin cancer     PAST SURGICAL HISTORY: Past Surgical History  Procedure Laterality Date  . Patent foramen ovale closure  11/17/11  . Coronary artery bypass graft  02/2006    CABG X 5  . Gastric resection  ~ 1987    "had ~ 90% of my stomach removed"  .  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 Knee arthroscopy      bilaterally; "long long time ago" (11/17/11)  . Patent foramen ovale closure N/A 11/17/2011    Procedure: PATENT FORAMEN OVALE CLOSURE;  Surgeon: Laverda Page, MD;  Location: [facility] CATH LAB;  Service: Cardiovascular;  Laterality: N/A;    FAMILY HISTORY: Family History  Problem Relation Age of Onset  . Cancer Mother   . Other Father     house fire  . Alzheimer's disease Sister   . Prostate cancer Brother     SOCIAL HISTORY:  History   Social History  . Marital Status: Married    Spouse Name: [name]    Number of Children: 0  . Years of Education: 9th   Occupational History  . retired Other   Social History Main  Topics  . Smoking status: Former Smoker -- 0.50 packs/day for 8 years    Quit date: 08/31/1965  . Smokeless tobacco: Current User    Types: Chew     Comment: 2 pouches daily  . Alcohol Use: No  . Drug Use: No  . Sexual Activity: No   Other Topics Concern  . Not on file   Social History Narrative   Pt lives at home with spouse.    Caffeine use: none        PHYSICAL EXAM  Filed Vitals:   09/14/14 0847  BP: 138/84  Pulse: 74  Temp: 97.6 F (36.4 C)  TempSrc: Oral  Height: '5\' 9"'  (1.753 m)  Weight: 170 lb 12.8 oz (77.474 kg)    Body mass index is 25.21 kg/(m^2).  No exam data present  No flowsheet data found.  GENERAL EXAM: Patient is in no distress; well developed, nourished and groomed; neck is supple  CARDIOVASCULAR: Regular rate and rhythm, no murmurs, no carotid bruits  NEUROLOGIC: MENTAL STATUS: awake, alert, oriented to person, place and time, recent and remote memory intact, normal attention and concentration, language fluent, comprehension intact, naming intact, fund of knowledge appropriate CRANIAL NERVE: no papilledema on fundoscopic exam, pupils equal and reactive to light, visual fields full to confrontation, extraocular muscles intact, no nystagmus, facial sensation symmetric, DECR LEFT NL FOLD; hearing intact, palate elevates symmetrically, uvula midline, shoulder shrug symmetric, tongue midline; SLUURED SPEECH; NASAL SPEECH MOTOR: normal bulk and tone, full strength in the BUE; BLE (HF 3-4, KE/KF4, DF 5) SENSORY: DECR VIB IN HANDS; ABSENT VIB AT TOES. DECR PP IN FEET AND LEGS. DECR TEMP IN HANDS AND FEET COORDINATION: finger-nose-finger, fine finger movements normal REFLEXES: BUE 1, KNEES 1, ANKLES 0 GAIT/STATION: SLOW, CAUTIOUS GAIT; STOOP POSTURE; SLOW TURNS; CANNOT TOE HEEL OR TANDEM WALK; ROMBERG NEG; PULLBACK TEST --> TAKES 1-2 STEPS AND STEADIES HIMSELF    DIAGNOSTIC DATA (LABS, IMAGING, TESTING) - I reviewed patient records, labs, notes,  testing and imaging myself where available.  Lab Results  Component Value Date   WBC 2.3* 04/13/2012   HGB 11.2* 04/13/2012   HCT 33.0* 04/13/2012   MCV 98.2 04/13/2012   PLT 136* 04/13/2012      Component Value Date/Time   NA 138 04/13/2012 1027   NA 142 11/02/2011 0810   K 3.8 04/13/2012 1027   K 4.5 11/02/2011 0810   CL 103 04/13/2012 1027   CL 106 11/02/2011 0810   CO2 27 11/02/2011 0810   CO2 25 01/16/2011 2151   GLUCOSE 107* 04/13/2012 1027   GLUCOSE 101 11/02/2011 0810   BUN 18 04/13/2012 1027  BUN 20 11/02/2011 0810   CREATININE 1.40* 04/13/2012 1027   CREATININE 1.1 11/02/2011 0810   CALCIUM 8.5 11/02/2011 0810   CALCIUM 9.1 01/16/2011 2151   PROT 7.6 11/02/2011 0810   PROT 6.5 01/16/2011 2151   ALBUMIN 3.5 01/16/2011 2151   AST 23 11/02/2011 0810   AST 18 01/16/2011 2151   ALT 16 11/02/2011 0810   ALT 10 01/16/2011 2151   ALKPHOS 62 11/02/2011 0810   ALKPHOS 72 01/16/2011 2151   BILITOT 0.50 11/02/2011 0810   BILITOT 0.2* 01/16/2011 2151   GFRNONAA >60 01/16/2011 2151   GFRAA  01/16/2011 2151    >60        The eGFR has been calculated using the MDRD equation. This calculation has not been validated in all clinical situations. eGFR's persistently <60 mL/min signify possible Chronic Kidney Disease.   Lab Results  Component Value Date   CHOL 155 01/17/2011   HDL 34* 01/17/2011   LDLCALC  01/17/2011    98        Total Cholesterol/HDL:CHD Risk Coronary Heart Disease Risk Table                     Men   Women  1/2 Average Risk   3.4   3.3  Average Risk       5.0   4.4  2 X Average Risk   9.6   7.1  3 X Average Risk  23.4   11.0        Use the calculated Patient Ratio above and the CHD Risk Table to determine the patient's CHD Risk.        ATP III CLASSIFICATION (LDL):  <100     mg/dL   Optimal  100-129  mg/dL   Near or Above                    Optimal  130-159  mg/dL   Borderline  160-189  mg/dL   High  >190     mg/dL   Very High   TRIG  114 01/17/2011   CHOLHDL 4.6 01/17/2011   Lab Results  Component Value Date   HGBA1C * 01/16/2011    5.8 (NOTE)                                                                       According to the ADA Clinical Practice Recommendations for 2011, when HbA1c is used as a screening test:   >=6.5%   Diagnostic of Diabetes Mellitus           (if abnormal result  is confirmed)  5.7-6.4%   Increased risk of developing Diabetes Mellitus  References:Diagnosis and Classification of Diabetes Mellitus,Diabetes RUEA,5409,81(XBJYN 1):S62-S69 and Standards of Medical Care in         Diabetes - 2011,Diabetes Care,2011,34  (Suppl 1):S11-S61.   Lab Results  Component Value Date   VITAMINB12 340 07/01/2006   No results found for: TSH  I reviewed images myself and agree with interpretation. -VRP  01/17/11 MRI brain - Acute right hemisphere cortical infarct affecting the motor strip. Left hand weakness would be consistent with the observed location. Atrophy and small vessel disease.  01/17/11 MRA head - Widely  patent carotid and basilar arteries. Right vertebral is the dominant contributor to the basilar. Left vertebral is severely diseased with a high-grade 75-90% stenosis distally. No proximal stenosis of the ACA, MCA, or PCA vessels. Mild irregularity distal MCA and PCA vessels suggests intracranial atherosclerotic disease.  IMPRESSION: Intracranial atherosclerotic change as described.     ASSESSMENT AND PLAN  74 y.o. year old male here with progressive gait and balance difficulty, neurogenic claudication, peripheral neuropathy symptoms, history of strokes. Could represent new vascular disease in the brain or lumbar radiculopathy/peripheral neuropathy processes.  PLAN: - Check MRI brain and lumbar spine - Check neuropathy labs - Consider physical therapy  Orders Placed This Encounter  Procedures  . MR Brain Wo Contrast  . MR Lumbar Spine Wo Contrast  . Vitamin B12  . Hemoglobin A1c  . TSH    Return in about 6 weeks (around 10/26/2014).    Penni Bombard, MD 4/74/2595, 6:38 AM Certified in Neurology, Neurophysiology and Neuroimaging  [facility] [address] [place], [place] [address] [phone]

## 2014-09-14 NOTE — Patient Instructions (Signed)
I will check additional testing. 

## 2014-09-15 LAB — HEMOGLOBIN A1C
ESTIMATED AVERAGE GLUCOSE: 126 mg/dL
HEMOGLOBIN A1C: 6 % — AB (ref 4.8–5.6)

## 2014-09-15 LAB — TSH: TSH: 3.24 u[IU]/mL (ref 0.450–4.500)

## 2014-09-15 LAB — VITAMIN B12: VITAMIN B 12: 469 pg/mL (ref 211–946)

## 2014-09-25 DIAGNOSIS — C44612 Basal cell carcinoma of skin of right upper limb, including shoulder: Secondary | ICD-10-CM | POA: Diagnosis not present

## 2014-09-28 ENCOUNTER — Ambulatory Visit
Admission: RE | Admit: 2014-09-28 | Discharge: 2014-09-28 | Disposition: A | Discharge status: Home / Self Care | Payer: Medicare Other | Source: Ambulatory Visit | Attending: Diagnostic Neuroimaging | Admitting: Diagnostic Neuroimaging

## 2014-09-28 DIAGNOSIS — R269 Unspecified abnormalities of gait and mobility: Secondary | ICD-10-CM

## 2014-09-28 DIAGNOSIS — M545 Low back pain, unspecified: Secondary | ICD-10-CM

## 2014-09-28 DIAGNOSIS — M25569 Pain in unspecified knee: Secondary | ICD-10-CM

## 2014-09-28 DIAGNOSIS — G609 Hereditary and idiopathic neuropathy, unspecified: Secondary | ICD-10-CM

## 2014-10-01 ENCOUNTER — Encounter: Payer: Self-pay | Admitting: Diagnostic Neuroimaging

## 2014-10-04 ENCOUNTER — Telehealth: Payer: Self-pay | Admitting: Diagnostic Neuroimaging

## 2014-10-04 DIAGNOSIS — I639 Cerebral infarction, unspecified: Secondary | ICD-10-CM

## 2014-10-04 MED ORDER — CLOPIDOGREL BISULFATE 75 MG PO TABS: 75.0000 mg | ORAL_TABLET | Freq: Every day | ORAL | Status: DC

## 2014-10-04 NOTE — Telephone Encounter (Signed)
i called patient with mri results. i spoke with his wife. Small acute punctate infarct in left frontal lobe (silent infarct, likely small vessel dz). Also with lumbar radiculopathy, and recommend conservative therapy.    PLAN: 1. Change aspirin to plavix 2. Check carotid u/s and TTE 3. PT evaluation for gait training   Penni Bombard, MD [id number], [id number] PM Certified in Neurology, Neurophysiology and Neuroimaging  [facility] [address] [place], [place] [address] [phone]

## 2014-10-05 ENCOUNTER — Encounter: Payer: Self-pay | Admitting: Diagnostic Neuroimaging

## 2014-10-08 ENCOUNTER — Encounter: Payer: Self-pay | Admitting: Diagnostic Neuroimaging

## 2014-10-08 MED ORDER — CLOPIDOGREL BISULFATE 75 MG PO TABS: 75.0000 mg | ORAL_TABLET | Freq: Every day | ORAL | Status: DC

## 2014-10-08 NOTE — Telephone Encounter (Signed)
Rx has been resent to [facility] for 90 day supply per patient request.

## 2014-10-09 ENCOUNTER — Telehealth: Payer: Self-pay | Admitting: Diagnostic Neuroimaging

## 2014-10-09 NOTE — Telephone Encounter (Signed)
Santiago Glad from [facility] is calling stating the patient would like to have Physical Therapy at their office because it is closer to where they live.  She would like for you to e-mail the order to [email or web address].  The fax is currently out of order.  Please call if you have any questions.

## 2014-10-16 ENCOUNTER — Telehealth: Payer: Self-pay | Admitting: Diagnostic Neuroimaging

## 2014-10-16 DIAGNOSIS — L820 Inflamed seborrheic keratosis: Secondary | ICD-10-CM | POA: Diagnosis not present

## 2014-10-16 DIAGNOSIS — C4441 Basal cell carcinoma of skin of scalp and neck: Secondary | ICD-10-CM | POA: Diagnosis not present

## 2014-10-16 DIAGNOSIS — D485 Neoplasm of uncertain behavior of skin: Secondary | ICD-10-CM | POA: Diagnosis not present

## 2014-10-16 NOTE — Telephone Encounter (Signed)
Aaron Whitaker with [facility] is calling as she needs a script for physical therapy.  Fax [phone].  Please call or fax.

## 2014-10-17 DIAGNOSIS — M545 Low back pain: Secondary | ICD-10-CM | POA: Diagnosis not present

## 2014-10-17 DIAGNOSIS — R269 Unspecified abnormalities of gait and mobility: Secondary | ICD-10-CM | POA: Diagnosis not present

## 2014-10-17 DIAGNOSIS — G609 Hereditary and idiopathic neuropathy, unspecified: Secondary | ICD-10-CM | POA: Diagnosis not present

## 2014-10-17 DIAGNOSIS — M25569 Pain in unspecified knee: Secondary | ICD-10-CM | POA: Diagnosis not present

## 2014-10-18 ENCOUNTER — Ambulatory Visit (INDEPENDENT_AMBULATORY_CARE_PROVIDER_SITE_OTHER): Payer: Medicare Other

## 2014-10-18 ENCOUNTER — Ambulatory Visit (HOSPITAL_COMMUNITY): Payer: Medicare Other | Attending: Diagnostic Neuroimaging | Admitting: Radiology

## 2014-10-18 DIAGNOSIS — I6330 Cerebral infarction due to thrombosis of unspecified cerebral artery: Secondary | ICD-10-CM | POA: Diagnosis not present

## 2014-10-18 DIAGNOSIS — E785 Hyperlipidemia, unspecified: Secondary | ICD-10-CM | POA: Diagnosis not present

## 2014-10-18 DIAGNOSIS — Z87891 Personal history of nicotine dependence: Secondary | ICD-10-CM | POA: Diagnosis not present

## 2014-10-18 DIAGNOSIS — I2510 Atherosclerotic heart disease of native coronary artery without angina pectoris: Secondary | ICD-10-CM

## 2014-10-18 DIAGNOSIS — I639 Cerebral infarction, unspecified: Secondary | ICD-10-CM

## 2014-10-18 DIAGNOSIS — I10 Essential (primary) hypertension: Secondary | ICD-10-CM | POA: Insufficient documentation

## 2014-10-18 NOTE — Progress Notes (Signed)
Echocardiogram performed.  

## 2014-10-19 DIAGNOSIS — M545 Low back pain: Secondary | ICD-10-CM | POA: Diagnosis not present

## 2014-10-19 DIAGNOSIS — M25569 Pain in unspecified knee: Secondary | ICD-10-CM | POA: Diagnosis not present

## 2014-10-19 DIAGNOSIS — R269 Unspecified abnormalities of gait and mobility: Secondary | ICD-10-CM | POA: Diagnosis not present

## 2014-10-19 DIAGNOSIS — G609 Hereditary and idiopathic neuropathy, unspecified: Secondary | ICD-10-CM | POA: Diagnosis not present

## 2014-10-22 DIAGNOSIS — G609 Hereditary and idiopathic neuropathy, unspecified: Secondary | ICD-10-CM | POA: Diagnosis not present

## 2014-10-22 DIAGNOSIS — M545 Low back pain: Secondary | ICD-10-CM | POA: Diagnosis not present

## 2014-10-22 DIAGNOSIS — R269 Unspecified abnormalities of gait and mobility: Secondary | ICD-10-CM | POA: Diagnosis not present

## 2014-10-22 DIAGNOSIS — M25569 Pain in unspecified knee: Secondary | ICD-10-CM | POA: Diagnosis not present

## 2014-10-24 DIAGNOSIS — G609 Hereditary and idiopathic neuropathy, unspecified: Secondary | ICD-10-CM | POA: Diagnosis not present

## 2014-10-24 DIAGNOSIS — R269 Unspecified abnormalities of gait and mobility: Secondary | ICD-10-CM | POA: Diagnosis not present

## 2014-10-24 DIAGNOSIS — M25569 Pain in unspecified knee: Secondary | ICD-10-CM | POA: Diagnosis not present

## 2014-10-24 DIAGNOSIS — M545 Low back pain: Secondary | ICD-10-CM | POA: Diagnosis not present

## 2014-10-26 DIAGNOSIS — G609 Hereditary and idiopathic neuropathy, unspecified: Secondary | ICD-10-CM | POA: Diagnosis not present

## 2014-10-26 DIAGNOSIS — M25569 Pain in unspecified knee: Secondary | ICD-10-CM | POA: Diagnosis not present

## 2014-10-26 DIAGNOSIS — R269 Unspecified abnormalities of gait and mobility: Secondary | ICD-10-CM | POA: Diagnosis not present

## 2014-10-26 DIAGNOSIS — M545 Low back pain: Secondary | ICD-10-CM | POA: Diagnosis not present

## 2014-10-29 ENCOUNTER — Telehealth: Payer: Self-pay | Admitting: Diagnostic Neuroimaging

## 2014-10-29 DIAGNOSIS — R269 Unspecified abnormalities of gait and mobility: Secondary | ICD-10-CM | POA: Diagnosis not present

## 2014-10-29 DIAGNOSIS — M545 Low back pain: Secondary | ICD-10-CM | POA: Diagnosis not present

## 2014-10-29 DIAGNOSIS — G609 Hereditary and idiopathic neuropathy, unspecified: Secondary | ICD-10-CM | POA: Diagnosis not present

## 2014-10-29 DIAGNOSIS — M25569 Pain in unspecified knee: Secondary | ICD-10-CM | POA: Diagnosis not present

## 2014-10-29 NOTE — Telephone Encounter (Signed)
Pt is calling for pt to get Echo cardiogram results.  Please call and advise.

## 2014-10-31 DIAGNOSIS — C4441 Basal cell carcinoma of skin of scalp and neck: Secondary | ICD-10-CM | POA: Diagnosis not present

## 2014-10-31 NOTE — Telephone Encounter (Signed)
Called and spoke with the pt, informed him that Dr. Leta Baptist was out of the office and would be back tomorrow. I told him I would get him the ECHO results as soon as Dr. Leta Baptist read them. Pt stated an understanding.

## 2014-11-05 DIAGNOSIS — M25569 Pain in unspecified knee: Secondary | ICD-10-CM | POA: Diagnosis not present

## 2014-11-05 DIAGNOSIS — M545 Low back pain: Secondary | ICD-10-CM | POA: Diagnosis not present

## 2014-11-05 DIAGNOSIS — G609 Hereditary and idiopathic neuropathy, unspecified: Secondary | ICD-10-CM | POA: Diagnosis not present

## 2014-11-05 DIAGNOSIS — R269 Unspecified abnormalities of gait and mobility: Secondary | ICD-10-CM | POA: Diagnosis not present

## 2014-11-06 ENCOUNTER — Telehealth: Payer: Self-pay | Admitting: Diagnostic Neuroimaging

## 2014-11-06 NOTE — Telephone Encounter (Signed)
Patient's spouse calling for Carotid doppler results.  Pleas call home # [phone], if no answer call cell # [phone] and advise.

## 2014-11-07 DIAGNOSIS — C44319 Basal cell carcinoma of skin of other parts of face: Secondary | ICD-10-CM | POA: Diagnosis not present

## 2014-11-09 DIAGNOSIS — M545 Low back pain: Secondary | ICD-10-CM | POA: Diagnosis not present

## 2014-11-09 DIAGNOSIS — R269 Unspecified abnormalities of gait and mobility: Secondary | ICD-10-CM | POA: Diagnosis not present

## 2014-11-09 DIAGNOSIS — M25569 Pain in unspecified knee: Secondary | ICD-10-CM | POA: Diagnosis not present

## 2014-11-09 DIAGNOSIS — G609 Hereditary and idiopathic neuropathy, unspecified: Secondary | ICD-10-CM | POA: Diagnosis not present

## 2014-11-12 DIAGNOSIS — G609 Hereditary and idiopathic neuropathy, unspecified: Secondary | ICD-10-CM | POA: Diagnosis not present

## 2014-11-12 DIAGNOSIS — M545 Low back pain: Secondary | ICD-10-CM | POA: Diagnosis not present

## 2014-11-12 DIAGNOSIS — R269 Unspecified abnormalities of gait and mobility: Secondary | ICD-10-CM | POA: Diagnosis not present

## 2014-11-12 DIAGNOSIS — M25569 Pain in unspecified knee: Secondary | ICD-10-CM | POA: Diagnosis not present

## 2014-11-13 ENCOUNTER — Ambulatory Visit (INDEPENDENT_AMBULATORY_CARE_PROVIDER_SITE_OTHER): Payer: Medicare Other | Admitting: Diagnostic Neuroimaging

## 2014-11-13 ENCOUNTER — Encounter: Payer: Self-pay | Admitting: Diagnostic Neuroimaging

## 2014-11-13 VITALS — BP 110/65 | HR 70 | Ht 69.0 in | Wt 180.0 lb

## 2014-11-13 DIAGNOSIS — M545 Low back pain, unspecified: Secondary | ICD-10-CM

## 2014-11-13 DIAGNOSIS — R269 Unspecified abnormalities of gait and mobility: Secondary | ICD-10-CM

## 2014-11-13 DIAGNOSIS — I639 Cerebral infarction, unspecified: Secondary | ICD-10-CM

## 2014-11-13 DIAGNOSIS — I6330 Cerebral infarction due to thrombosis of unspecified cerebral artery: Secondary | ICD-10-CM

## 2014-11-13 NOTE — Progress Notes (Signed)
[facility]  PATIENT: Aaron Whitaker DOB: [birth year]  REFERRING CLINICIAN: Tobie Lords HISTORY FROM: patient and wife REASON FOR VISIT: follow up   HISTORICAL  CHIEF COMPLAINT:  Chief Complaint  Patient presents with  . Gait Problem    RM 7 - Follow up    HISTORY OF PRESENT ILLNESS:   UPDATE 11/13/14: Since last visit, has done well with PT. No more falls. Testing results reviewed. Overall doing well.  PRIOR HPI (09/14/14): 74 year old male with hypertension, hyperglycemia, coronary artery disease status post CABG, prostate cancer status post radiation therapy, stroke 2 affecting left leg and then left arm/hand strength, here for evaluation of gait and balance difficulty. Over past 10 years patient has had intermittent lower extremity pains from knees down to feet. Over time he has been having more problems with cramps in his calves especially if he exerts himself. Since July 2015 he has been having more balance problems. He has fallen down a time since then. Around Christmas 2015, patient was playing golf, having trouble walking normally on the golf course. When he would start walking, his steps would speed up and then he would lose control. Patient has fallen towards the left side as well as backwards. No new tremor or shaking. No sudden onset focal neurologic symptoms. No new unilateral symptoms.    REVIEW OF SYSTEMS: Full 14 system review of systems performed and notable only for hearing loss,  skin cancer surg recently (right post ear).   ALLERGIES: No Known Allergies  HOME MEDICATIONS: Outpatient Prescriptions Prior to Visit  Medication Sig Dispense Refill  . atorvastatin (LIPITOR) 40 MG tablet Take 40 mg by mouth daily.    . carvedilol (COREG) 12.5 MG tablet Take 3.125 mg by mouth 2 (two) times daily with a meal.     . clopidogrel (PLAVIX) 75 MG tablet Take 1 tablet (75 mg total) by mouth daily. 90 tablet 4  . diphenhydrAMINE (BENADRYL) 25 MG tablet  Take 25 mg by mouth every morning.    . fish oil-omega-3 fatty acids 1000 MG capsule Take 1,200 mg by mouth daily.    Marland Kitchen FLUTICASONE PROPIONATE, NASAL, NA Place 2 puffs into the nose every morning.    . Multiple Vitamin (MULTIVITAMIN) capsule Take 1 capsule by mouth daily.     No facility-administered medications prior to visit.    PAST MEDICAL HISTORY: Past Medical History  Diagnosis Date  . Hypertension   . Hyperlipidemia   . Stroke 2007; 12/2010    "light"; denies residuals  . Ulcerative colitis   . Pernicious anemia   . Hyperthyroidism   . Hx of CABG   . MI (myocardial infarction) 03/2006  . Blood transfusion   . Lower GI bleeding 1987    "bleeding ulcers; got 6 pints of blood"  . Arthritis   . Prostate cancer ~2005    S/P radiation; seed implants  . Heart disease   . Skin cancer     PAST SURGICAL HISTORY: Past Surgical History  Procedure Laterality Date  . Patent foramen ovale closure  11/17/11  . Coronary artery bypass graft  02/2006    CABG X 5  . Gastric resection  ~ 1987    "had ~ 90% of my stomach removed"  . 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 Knee arthroscopy      bilaterally; "long long time ago" (11/17/11)  . Patent foramen ovale closure N/A 11/17/2011    Procedure: PATENT FORAMEN OVALE CLOSURE;  Surgeon:  Laverda Page, MD;  Location: [facility] CATH LAB;  Service: Cardiovascular;  Laterality: N/A;    FAMILY HISTORY: Family History  Problem Relation Age of Onset  . Cancer Mother   . Other Father     house fire  . Alzheimer's disease Sister   . Prostate cancer Brother     SOCIAL HISTORY:  History   Social History  . Marital Status: Married    Spouse Name: [name]  . Number of Children: 0  . Years of Education: 9th   Occupational History  . retired Other   Social History Main Topics  . Smoking status: Former Smoker -- 0.50 packs/day for 8 years    Quit date: 08/31/1965  . Smokeless tobacco: Current User    Types: Chew     Comment: 2  pouches daily  . Alcohol Use: No  . Drug Use: No  . Sexual Activity: No   Other Topics Concern  . Not on file   Social History Narrative   Pt lives at home with spouse LIZ.   Retired.   9th grade.   Right handed.   Caffeine None      PHYSICAL EXAM  Filed Vitals:   11/13/14 1341  BP: 110/65  Pulse: 70  Height: '5\' 9"'  (1.753 m)  Weight: 180 lb (81.647 kg)    Body mass index is 26.57 kg/(m^2).  No exam data present  No flowsheet data found.  GENERAL EXAM: Patient is in no distress; well developed, nourished and groomed; neck is supple  CARDIOVASCULAR: Regular rate and rhythm, no murmurs, no carotid bruits  NEUROLOGIC: MENTAL STATUS: awake, alert, language fluent, comprehension intact, naming intact, fund of knowledge appropriate; POSITIVE SNOUT; NEG MYERSONS CRANIAL NERVE: no papilledema on fundoscopic exam, pupils equal and reactive to light, visual fields full to confrontation, extraocular muscles intact, no nystagmus, facial sensation symmetric, DECR LEFT NL FOLD; hearing intact, palate elevates symmetrically, uvula midline, shoulder shrug symmetric, tongue midline; SLURRED SPEECH; NASAL SPEECH MOTOR: normal bulk and tone, full strength in the BUE EXCEPT RIGHT WRIST BONE FUSION; BLE (HF 4+, OTHERWISE 5); DECR FINGER TAP AND FOOT TAP ON LEFT SIDE SENSORY: DECR VIB IN HANDS; ABSENT VIB AT TOES. DECR PP IN FEET AND LEGS. DECR TEMP IN HANDS AND FEET COORDINATION: finger-nose-finger, fine finger movements normal REFLEXES: BUE 1, KNEES 1, ANKLES 0 GAIT/STATION: SLOW, CAREFUL GAIT; STOOP POSTURE; SLOW TURNS     DIAGNOSTIC DATA (LABS, IMAGING, TESTING) - I reviewed patient records, labs, notes, testing and imaging myself where available.  Lab Results  Component Value Date   WBC 2.3* 04/13/2012   HGB 11.2* 04/13/2012   HCT 33.0* 04/13/2012   MCV 98.2 04/13/2012   PLT 136* 04/13/2012      Component Value Date/Time   NA 138 04/13/2012 1027   NA 142 11/02/2011 0810    K 3.8 04/13/2012 1027   K 4.5 11/02/2011 0810   CL 103 04/13/2012 1027   CL 106 11/02/2011 0810   CO2 27 11/02/2011 0810   CO2 25 01/16/2011 2151   GLUCOSE 107* 04/13/2012 1027   GLUCOSE 101 11/02/2011 0810   BUN 18 04/13/2012 1027   BUN 20 11/02/2011 0810   CREATININE 1.40* 04/13/2012 1027   CREATININE 1.1 11/02/2011 0810   CALCIUM 8.5 11/02/2011 0810   CALCIUM 9.1 01/16/2011 2151   PROT 7.6 11/02/2011 0810   PROT 6.5 01/16/2011 2151   ALBUMIN 3.5 01/16/2011 2151   AST 23 11/02/2011 0810   AST 18 01/16/2011 2151  ALT 16 11/02/2011 0810   ALT 10 01/16/2011 2151   ALKPHOS 62 11/02/2011 0810   ALKPHOS 72 01/16/2011 2151   BILITOT 0.50 11/02/2011 0810   BILITOT 0.2* 01/16/2011 2151   GFRNONAA >60 01/16/2011 2151   GFRAA  01/16/2011 2151    >60        The eGFR has been calculated using the MDRD equation. This calculation has not been validated in all clinical situations. eGFR's persistently <60 mL/min signify possible Chronic Kidney Disease.   Lab Results  Component Value Date   CHOL 155 01/17/2011   HDL 34* 01/17/2011   LDLCALC  01/17/2011    98        Total Cholesterol/HDL:CHD Risk Coronary Heart Disease Risk Table                     Men   Women  1/2 Average Risk   3.4   3.3  Average Risk       5.0   4.4  2 X Average Risk   9.6   7.1  3 X Average Risk  23.4   11.0        Use the calculated Patient Ratio above and the CHD Risk Table to determine the patient's CHD Risk.        ATP III CLASSIFICATION (LDL):  <100     mg/dL   Optimal  100-129  mg/dL   Near or Above                    Optimal  130-159  mg/dL   Borderline  160-189  mg/dL   High  >190     mg/dL   Very High   TRIG 114 01/17/2011   CHOLHDL 4.6 01/17/2011   Lab Results  Component Value Date   HGBA1C 6.0* 09/14/2014   Lab Results  Component Value Date   VITAMINB12 469 09/14/2014   Lab Results  Component Value Date   TSH 3.240 09/14/2014    I reviewed images myself and agree with  interpretation. -VRP  01/17/11 MRI brain - Acute right hemisphere cortical infarct affecting the motor strip. Left hand weakness would be consistent with the observed location. Atrophy and small vessel disease.  01/17/11 MRA head - Widely patent carotid and basilar arteries. Right vertebral is the dominant contributor to the basilar. Left vertebral is severely diseased with a high-grade 75-90% stenosis distally. No proximal stenosis of the ACA, MCA, or PCA vessels. Mild irregularity distal MCA and PCA vessels suggests intracranial atherosclerotic disease.  IMPRESSION: Intracranial atherosclerotic change as described.  09/28/14 MRI brain (without) demonstrating: 1. Acute, punctate, left frontal periventricular ischemic infarction.  2. Moderate periventricular, subcortical and pontine chronic small vessel ischemic disease.  3. Compared to MRI on 01/17/11, there has been progression of chronic small vessel ischemic disease, and the punctate acute infarction is a new finding.  09/28/14 MRI lumbar spine (without) demonstrating: 1. At L5-S1: disc bulging and facet hypertrophy with mild-moderate right and severe left foraminal stenosis  2. At L4-5: disc bulging and facet hypertrophy with moderate biforaminal stenosis  3. At L2-3, L3-4: disc bulging and facet hypertrophy with mild biforaminal stenosis  4. Scoliosis convex to the left centered at L3.  10/18/14 CAROTID - no ICA stenosis; mild plaque in left bulb  10/18/14 TTE - Low normal LV function; grade 1 diastolic dysfunction; sclerotic aortic valve with trace AI; closure device noted on atrial septum with no residual shunt.  ASSESSMENT AND PLAN  74 y.o. year old male here with progressive gait and balance difficulty, neurogenic claudication, peripheral neuropathy symptoms, history of strokes. Likely represents progressive cerebral small vessel ischemic disease with underlying lumbar radiculopathy/peripheral neuropathy processes. Doing better  with PT.  PLAN: - vascular risk factor reduction (BP, lipid, sugar control; plavix, statin, coreg).  - PT exercises at home - caution with uneven and wet surfaces  Return in about 1 year (around 11/13/2015).  I spent 15 minutes of face to face time with patient. Greater than 50% of time was spent in counseling and coordination of care with patient. In summary we discussed stroke risk factors, medication mgmt, physical therapy eval and treatments.    Penni Bombard, MD 9/45/8592, 9:24 PM Certified in Neurology, Neurophysiology and Neuroimaging  [facility] [address], [place], [place] [address] [phone]

## 2014-11-14 DIAGNOSIS — M25569 Pain in unspecified knee: Secondary | ICD-10-CM | POA: Diagnosis not present

## 2014-11-14 DIAGNOSIS — M545 Low back pain: Secondary | ICD-10-CM | POA: Diagnosis not present

## 2014-11-14 DIAGNOSIS — G609 Hereditary and idiopathic neuropathy, unspecified: Secondary | ICD-10-CM | POA: Diagnosis not present

## 2014-11-14 DIAGNOSIS — R269 Unspecified abnormalities of gait and mobility: Secondary | ICD-10-CM | POA: Diagnosis not present

## 2014-11-16 DIAGNOSIS — D495 Neoplasm of unspecified behavior of other genitourinary organs: Secondary | ICD-10-CM | POA: Diagnosis not present

## 2014-11-16 DIAGNOSIS — C61 Malignant neoplasm of prostate: Secondary | ICD-10-CM | POA: Diagnosis not present

## 2014-11-16 DIAGNOSIS — N5201 Erectile dysfunction due to arterial insufficiency: Secondary | ICD-10-CM | POA: Diagnosis not present

## 2014-11-19 DIAGNOSIS — M25569 Pain in unspecified knee: Secondary | ICD-10-CM | POA: Diagnosis not present

## 2014-11-19 DIAGNOSIS — M545 Low back pain: Secondary | ICD-10-CM | POA: Diagnosis not present

## 2014-11-19 DIAGNOSIS — G609 Hereditary and idiopathic neuropathy, unspecified: Secondary | ICD-10-CM | POA: Diagnosis not present

## 2014-11-19 DIAGNOSIS — R269 Unspecified abnormalities of gait and mobility: Secondary | ICD-10-CM | POA: Diagnosis not present

## 2014-11-21 DIAGNOSIS — M545 Low back pain: Secondary | ICD-10-CM | POA: Diagnosis not present

## 2014-11-21 DIAGNOSIS — R269 Unspecified abnormalities of gait and mobility: Secondary | ICD-10-CM | POA: Diagnosis not present

## 2014-11-21 DIAGNOSIS — G609 Hereditary and idiopathic neuropathy, unspecified: Secondary | ICD-10-CM | POA: Diagnosis not present

## 2014-11-21 DIAGNOSIS — M25569 Pain in unspecified knee: Secondary | ICD-10-CM | POA: Diagnosis not present

## 2014-11-22 DIAGNOSIS — M545 Low back pain: Secondary | ICD-10-CM | POA: Diagnosis not present

## 2014-11-22 DIAGNOSIS — G609 Hereditary and idiopathic neuropathy, unspecified: Secondary | ICD-10-CM | POA: Diagnosis not present

## 2014-11-22 DIAGNOSIS — R269 Unspecified abnormalities of gait and mobility: Secondary | ICD-10-CM | POA: Diagnosis not present

## 2014-11-22 DIAGNOSIS — M25569 Pain in unspecified knee: Secondary | ICD-10-CM | POA: Diagnosis not present

## 2014-11-26 DIAGNOSIS — M25569 Pain in unspecified knee: Secondary | ICD-10-CM | POA: Diagnosis not present

## 2014-11-26 DIAGNOSIS — R269 Unspecified abnormalities of gait and mobility: Secondary | ICD-10-CM | POA: Diagnosis not present

## 2014-11-26 DIAGNOSIS — M545 Low back pain: Secondary | ICD-10-CM | POA: Diagnosis not present

## 2014-11-26 DIAGNOSIS — G609 Hereditary and idiopathic neuropathy, unspecified: Secondary | ICD-10-CM | POA: Diagnosis not present

## 2014-11-28 DIAGNOSIS — R269 Unspecified abnormalities of gait and mobility: Secondary | ICD-10-CM | POA: Diagnosis not present

## 2014-11-28 DIAGNOSIS — G609 Hereditary and idiopathic neuropathy, unspecified: Secondary | ICD-10-CM | POA: Diagnosis not present

## 2014-11-28 DIAGNOSIS — M25569 Pain in unspecified knee: Secondary | ICD-10-CM | POA: Diagnosis not present

## 2014-11-28 DIAGNOSIS — M545 Low back pain: Secondary | ICD-10-CM | POA: Diagnosis not present

## 2014-12-03 DIAGNOSIS — M545 Low back pain: Secondary | ICD-10-CM | POA: Diagnosis not present

## 2014-12-03 DIAGNOSIS — M25569 Pain in unspecified knee: Secondary | ICD-10-CM | POA: Diagnosis not present

## 2014-12-03 DIAGNOSIS — G609 Hereditary and idiopathic neuropathy, unspecified: Secondary | ICD-10-CM | POA: Diagnosis not present

## 2014-12-03 DIAGNOSIS — R269 Unspecified abnormalities of gait and mobility: Secondary | ICD-10-CM | POA: Diagnosis not present

## 2014-12-07 DIAGNOSIS — Z6824 Body mass index (BMI) 24.0-24.9, adult: Secondary | ICD-10-CM | POA: Diagnosis not present

## 2014-12-07 DIAGNOSIS — R269 Unspecified abnormalities of gait and mobility: Secondary | ICD-10-CM | POA: Diagnosis not present

## 2014-12-07 DIAGNOSIS — Z9181 History of falling: Secondary | ICD-10-CM | POA: Diagnosis not present

## 2014-12-07 DIAGNOSIS — M25569 Pain in unspecified knee: Secondary | ICD-10-CM | POA: Diagnosis not present

## 2014-12-07 DIAGNOSIS — M545 Low back pain: Secondary | ICD-10-CM | POA: Diagnosis not present

## 2014-12-07 DIAGNOSIS — Z79899 Other long term (current) drug therapy: Secondary | ICD-10-CM | POA: Diagnosis not present

## 2014-12-07 DIAGNOSIS — Z23 Encounter for immunization: Secondary | ICD-10-CM | POA: Diagnosis not present

## 2014-12-07 DIAGNOSIS — I639 Cerebral infarction, unspecified: Secondary | ICD-10-CM | POA: Diagnosis not present

## 2014-12-07 DIAGNOSIS — G609 Hereditary and idiopathic neuropathy, unspecified: Secondary | ICD-10-CM | POA: Diagnosis not present

## 2014-12-07 DIAGNOSIS — I2510 Atherosclerotic heart disease of native coronary artery without angina pectoris: Secondary | ICD-10-CM | POA: Diagnosis not present

## 2014-12-07 DIAGNOSIS — E538 Deficiency of other specified B group vitamins: Secondary | ICD-10-CM | POA: Diagnosis not present

## 2014-12-07 DIAGNOSIS — Z1389 Encounter for screening for other disorder: Secondary | ICD-10-CM | POA: Diagnosis not present

## 2014-12-07 DIAGNOSIS — E780 Pure hypercholesterolemia: Secondary | ICD-10-CM | POA: Diagnosis not present

## 2014-12-20 DIAGNOSIS — I2510 Atherosclerotic heart disease of native coronary artery without angina pectoris: Secondary | ICD-10-CM | POA: Diagnosis not present

## 2014-12-20 DIAGNOSIS — I10 Essential (primary) hypertension: Secondary | ICD-10-CM | POA: Diagnosis not present

## 2014-12-20 DIAGNOSIS — E780 Pure hypercholesterolemia: Secondary | ICD-10-CM | POA: Diagnosis not present

## 2015-02-25 ENCOUNTER — Other Ambulatory Visit: Payer: Self-pay

## 2015-03-01 HISTORY — PX: PATENT FORAMEN OVALE CLOSURE: SHX2181

## 2015-03-31 ENCOUNTER — Inpatient Hospital Stay (HOSPITAL_COMMUNITY)
Admission: EM | Admit: 2015-03-31 | Discharge: 2015-04-03 | DRG: 066 | Disposition: A | Discharge status: Home / Self Care | Payer: Medicare Other | Attending: Family Medicine | Admitting: Family Medicine

## 2015-03-31 ENCOUNTER — Encounter (HOSPITAL_COMMUNITY): Payer: Self-pay | Admitting: Family Medicine

## 2015-03-31 ENCOUNTER — Observation Stay (HOSPITAL_COMMUNITY): Payer: Medicare Other

## 2015-03-31 ENCOUNTER — Emergency Department (HOSPITAL_COMMUNITY): Payer: Medicare Other

## 2015-03-31 DIAGNOSIS — I63319 Cerebral infarction due to thrombosis of unspecified middle cerebral artery: Secondary | ICD-10-CM | POA: Diagnosis not present

## 2015-03-31 DIAGNOSIS — I6330 Cerebral infarction due to thrombosis of unspecified cerebral artery: Secondary | ICD-10-CM | POA: Insufficient documentation

## 2015-03-31 DIAGNOSIS — I6502 Occlusion and stenosis of left vertebral artery: Secondary | ICD-10-CM | POA: Diagnosis not present

## 2015-03-31 DIAGNOSIS — I10 Essential (primary) hypertension: Secondary | ICD-10-CM | POA: Diagnosis not present

## 2015-03-31 DIAGNOSIS — Z7902 Long term (current) use of antithrombotics/antiplatelets: Secondary | ICD-10-CM

## 2015-03-31 DIAGNOSIS — E784 Other hyperlipidemia: Secondary | ICD-10-CM

## 2015-03-31 DIAGNOSIS — E785 Hyperlipidemia, unspecified: Secondary | ICD-10-CM | POA: Diagnosis not present

## 2015-03-31 DIAGNOSIS — Z951 Presence of aortocoronary bypass graft: Secondary | ICD-10-CM

## 2015-03-31 DIAGNOSIS — I252 Old myocardial infarction: Secondary | ICD-10-CM

## 2015-03-31 DIAGNOSIS — Z87891 Personal history of nicotine dependence: Secondary | ICD-10-CM

## 2015-03-31 DIAGNOSIS — R4701 Aphasia: Secondary | ICD-10-CM

## 2015-03-31 DIAGNOSIS — I639 Cerebral infarction, unspecified: Secondary | ICD-10-CM

## 2015-03-31 DIAGNOSIS — Q211 Atrial septal defect: Secondary | ICD-10-CM | POA: Insufficient documentation

## 2015-03-31 DIAGNOSIS — Z8673 Personal history of transient ischemic attack (TIA), and cerebral infarction without residual deficits: Secondary | ICD-10-CM | POA: Insufficient documentation

## 2015-03-31 DIAGNOSIS — Q2112 Patent foramen ovale: Secondary | ICD-10-CM | POA: Insufficient documentation

## 2015-03-31 DIAGNOSIS — I2510 Atherosclerotic heart disease of native coronary artery without angina pectoris: Secondary | ICD-10-CM | POA: Diagnosis not present

## 2015-03-31 DIAGNOSIS — R531 Weakness: Secondary | ICD-10-CM | POA: Diagnosis not present

## 2015-03-31 DIAGNOSIS — I63412 Cerebral infarction due to embolism of left middle cerebral artery: Secondary | ICD-10-CM | POA: Diagnosis not present

## 2015-03-31 DIAGNOSIS — R7309 Other abnormal glucose: Secondary | ICD-10-CM | POA: Diagnosis present

## 2015-03-31 DIAGNOSIS — Z66 Do not resuscitate: Secondary | ICD-10-CM | POA: Diagnosis present

## 2015-03-31 DIAGNOSIS — R479 Unspecified speech disturbances: Secondary | ICD-10-CM | POA: Diagnosis not present

## 2015-03-31 DIAGNOSIS — I25709 Atherosclerosis of coronary artery bypass graft(s), unspecified, with unspecified angina pectoris: Secondary | ICD-10-CM | POA: Insufficient documentation

## 2015-03-31 LAB — I-STAT CHEM 8, ED
BUN: 30 mg/dL — AB (ref 6–20)
CALCIUM ION: 1.16 mmol/L (ref 1.13–1.30)
Chloride: 107 mmol/L (ref 101–111)
Creatinine, Ser: 1.2 mg/dL (ref 0.61–1.24)
GLUCOSE: 103 mg/dL — AB (ref 65–99)
HEMATOCRIT: 42 % (ref 39.0–52.0)
Hemoglobin: 14.3 g/dL (ref 13.0–17.0)
Potassium: 4.1 mmol/L (ref 3.5–5.1)
Sodium: 141 mmol/L (ref 135–145)
TCO2: 21 mmol/L (ref 0–100)

## 2015-03-31 LAB — DIFFERENTIAL
BASOS ABS: 0.1 10*3/uL (ref 0.0–0.1)
BASOS PCT: 1 % (ref 0–1)
EOS ABS: 1.7 10*3/uL — AB (ref 0.0–0.7)
Eosinophils Relative: 27 % — ABNORMAL HIGH (ref 0–5)
LYMPHS PCT: 25 % (ref 12–46)
Lymphs Abs: 1.6 10*3/uL (ref 0.7–4.0)
Monocytes Absolute: 0.4 10*3/uL (ref 0.1–1.0)
Monocytes Relative: 7 % (ref 3–12)
NEUTROS ABS: 2.5 10*3/uL (ref 1.7–7.7)
Neutrophils Relative %: 40 % — ABNORMAL LOW (ref 43–77)

## 2015-03-31 LAB — COMPREHENSIVE METABOLIC PANEL
ALK PHOS: 97 U/L (ref 38–126)
ALT: 20 U/L (ref 17–63)
AST: 20 U/L (ref 15–41)
Albumin: 3.6 g/dL (ref 3.5–5.0)
Anion gap: 9 (ref 5–15)
BUN: 27 mg/dL — ABNORMAL HIGH (ref 6–20)
CO2: 22 mmol/L (ref 22–32)
Calcium: 8.8 mg/dL — ABNORMAL LOW (ref 8.9–10.3)
Chloride: 108 mmol/L (ref 101–111)
Creatinine, Ser: 1.27 mg/dL — ABNORMAL HIGH (ref 0.61–1.24)
GFR calc Af Amer: >60 mL/min (ref >60)
GFR calc non Af Amer: 54 mL/min — ABNORMAL LOW (ref >60)
GLUCOSE: 103 mg/dL — AB (ref 65–99)
POTASSIUM: 4.1 mmol/L (ref 3.5–5.1)
SODIUM: 139 mmol/L (ref 135–145)
TOTAL PROTEIN: 6.4 g/dL — AB (ref 6.5–8.1)
Total Bilirubin: 0.7 mg/dL (ref 0.3–1.2)

## 2015-03-31 LAB — CBC
HEMATOCRIT: 40.1 % (ref 39.0–52.0)
HEMOGLOBIN: 13.4 g/dL (ref 13.0–17.0)
MCH: 31.3 pg (ref 26.0–34.0)
MCHC: 33.4 g/dL (ref 30.0–36.0)
MCV: 93.7 fL (ref 78.0–100.0)
Platelets: 233 10*3/uL (ref 150–400)
RBC: 4.28 MIL/uL (ref 4.22–5.81)
RDW: 12.7 % (ref 11.5–15.5)
WBC: 6.3 10*3/uL (ref 4.0–10.5)

## 2015-03-31 LAB — RAPID URINE DRUG SCREEN, HOSP PERFORMED
Amphetamines: NOT DETECTED
Barbiturates: NOT DETECTED
Benzodiazepines: NOT DETECTED
COCAINE: NOT DETECTED
OPIATES: NOT DETECTED
TETRAHYDROCANNABINOL: NOT DETECTED

## 2015-03-31 LAB — URINALYSIS, ROUTINE W REFLEX MICROSCOPIC
BILIRUBIN URINE: NEGATIVE
GLUCOSE, UA: NEGATIVE mg/dL
HGB URINE DIPSTICK: NEGATIVE
Ketones, ur: NEGATIVE mg/dL
Leukocytes, UA: NEGATIVE
Nitrite: NEGATIVE
PH: 6 (ref 5.0–8.0)
Protein, ur: NEGATIVE mg/dL
SPECIFIC GRAVITY, URINE: 1.021 (ref 1.005–1.030)
Urobilinogen, UA: 0.2 mg/dL (ref 0.0–1.0)

## 2015-03-31 LAB — I-STAT TROPONIN, ED: TROPONIN I, POC: 0 ng/mL (ref 0.00–0.08)

## 2015-03-31 LAB — PROTIME-INR
INR: 1.1 (ref 0.00–1.49)
Prothrombin Time: 14.4 seconds (ref 11.6–15.2)

## 2015-03-31 LAB — ETHANOL: Alcohol, Ethyl (B): <5 mg/dL (ref <5)

## 2015-03-31 LAB — TROPONIN I: Troponin I: <0.03 ng/mL (ref <0.031)

## 2015-03-31 LAB — APTT: aPTT: 27 seconds (ref 24–37)

## 2015-03-31 MED ORDER — SENNOSIDES-DOCUSATE SODIUM 8.6-50 MG PO TABS: 1.0000 | ORAL_TABLET | Freq: Every evening | ORAL | Status: DC | PRN

## 2015-03-31 MED ORDER — ATORVASTATIN CALCIUM 40 MG PO TABS
40.0000 mg | ORAL_TABLET | Freq: Every day | ORAL | Status: DC
  Administered 2015-03-31: 40 mg via ORAL
  Filled 2015-03-31: qty 1

## 2015-03-31 MED ORDER — HEPARIN SODIUM (PORCINE) 5000 UNIT/ML IJ SOLN
5000.0000 [IU] | Freq: Three times a day (TID) | INTRAMUSCULAR | Status: DC
  Administered 2015-03-31 – 2015-04-02 (×8): 5000 [IU] via SUBCUTANEOUS
  Filled 2015-03-31 (×8): qty 1

## 2015-03-31 MED ORDER — CLOPIDOGREL BISULFATE 75 MG PO TABS
75.0000 mg | ORAL_TABLET | Freq: Every day | ORAL | Status: DC
  Administered 2015-03-31 – 2015-04-03 (×4): 75 mg via ORAL
  Filled 2015-03-31 (×4): qty 1

## 2015-03-31 MED ORDER — STROKE: EARLY STAGES OF RECOVERY BOOK
Freq: Once | Status: AC
  Administered 2015-03-31: 14:00:00

## 2015-03-31 NOTE — Consult Note (Addendum)
Referring Physician: ED    Chief Complaint: dysphasia  HPI:                                                                                                                                         Aaron Whitaker is an 74 y.o. 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 Patient's wife report that she last saw him normal last night at 9 pm. Then, he woke up this morning with difficulty getting his words out. He said that he knows what he wants to say but just can not get it out in a proper manner. He denies having trouble with comprehension of language.  No HA, vertigo, double vision, difficulty swallowing, focal weakness or numbness, unsteadiness, or visual disturbances.  Has been on plavix. CT brain was personally reviewed and showed no acute abnormality. ETOH level<5, wbc 6.3, Cr 1.27  Date last known well: 03/30/15 Time last known well: 9 pm tPA Given: no, out of the window NIHSS: 2 MRS: 0  Past Medical History  Diagnosis Date  . Hypertension   . Hyperlipidemia   . Stroke 2007; 12/2010    "light"; denies residuals  . Ulcerative colitis   . Pernicious anemia   . Hyperthyroidism   . Hx of CABG   . MI (myocardial infarction) 03/2006  . Blood transfusion   . Lower GI bleeding 1987    "bleeding ulcers; got 6 pints of blood"  . Arthritis   . Prostate cancer ~2005    S/P radiation; seed implants  . Heart disease   . Skin cancer     Past Surgical History  Procedure Laterality Date  . Patent foramen ovale closure  11/17/11  . Coronary artery bypass graft  02/2006    CABG X 5  . Gastric resection  ~ 1987    "had ~ 90% of my stomach removed"  . 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 Knee arthroscopy      bilaterally; "long long time ago" (11/17/11)  . Patent foramen  ovale closure N/A 11/17/2011    Procedure: PATENT FORAMEN OVALE CLOSURE;  Surgeon: Laverda Page, MD;  Location: [facility] CATH LAB;  Service: Cardiovascular;  Laterality: N/A;    Family History  Problem Relation Age of Onset  . Cancer Mother   . Other Father     house fire  . Alzheimer's disease Sister   . Prostate cancer Brother    Social History:  reports that he quit smoking about 49 years ago. His smokeless tobacco use includes Chew. He reports that he does not drink alcohol or use illicit drugs.  Allergies: No Known Allergies  Medications:  I have reviewed the patient's current medications.  ROS:                                                                                                                                       History obtained from wife, chart review and the patient  General ROS: negative for - chills, fatigue, fever, night sweats, weight gain or weight loss Psychological ROS: negative for - behavioral disorder, hallucinations, memory difficulties, mood swings or suicidal ideation Ophthalmic ROS: negative for - blurry vision, double vision, eye pain or loss of vision ENT ROS: negative for - epistaxis, nasal discharge, oral lesions, sore throat, tinnitus or vertigo Allergy and Immunology ROS: negative for - hives or itchy/watery eyes Hematological and Lymphatic ROS: negative for - bleeding problems, bruising or swollen lymph nodes Endocrine ROS: negative for - galactorrhea, hair pattern changes, polydipsia/polyuria or temperature intolerance Respiratory ROS: negative for - cough, hemoptysis, shortness of breath or wheezing Cardiovascular ROS: negative for - chest pain, dyspnea on exertion, edema or irregular heartbeat Gastrointestinal ROS: negative for - abdominal pain, diarrhea, hematemesis, nausea/vomiting or stool incontinence Genito-Urinary  ROS: negative for - dysuria, hematuria, incontinence or urinary frequency/urgency Musculoskeletal ROS: negative for - joint swelling or muscular weakness Neurological ROS: as noted in HPI Dermatological ROS: negative for rash and skin lesion changes   Physical exam: pleasant male in no apparent distress. Blood pressure 159/73, pulse 52, temperature 97.9 F (36.6 C), temperature source Oral, resp. rate 17, SpO2 97 %. Head: normocephalic. Neck: supple, no bruits, no JVD. Cardiac: no murmurs. Lungs: clear. Abdomen: soft, no tender, no mass. Extremities: no edema. Skin: no rash  Neurologic Examination:                                                                                                      General: Mental Status: Alert, oriented, thought content appropriate. Motor dysphasia.  Able to follow 3 step commands without difficulty. Cranial Nerves: II: Discs flat bilaterally; Visual fields grossly normal, pupils equal, round, reactive to light and accommodation III,IV, VI: ptosis not present, extra-ocular motions intact bilaterally V,VII: smile symmetric, facial light touch sensation normal bilaterally VIII: hearing normal bilaterally IX,X: uvula rises symmetrically XI: bilateral shoulder shrug XII: midline tongue extension without atrophy or fasciculations  Motor: Right : Upper extremity   5/5    Left:     Upper extremity   5/5  Lower extremity   5/5     Lower extremity   5/5 Tone and bulk:normal  tone throughout; no atrophy noted Sensory: Pinprick and light touch intact throughout, bilaterally Deep Tendon Reflexes:  Right: Upper Extremity   Left: Upper extremity   biceps (C-5 to C-6) 2/4   biceps (C-5 to C-6) 2/4 tricep (C7) 2/4    triceps (C7) 2/4 Brachioradialis (C6) 2/4  Brachioradialis (C6) 2/4  Lower Extremity Lower Extremity  quadriceps (L-2 to L-4) 2/4   quadriceps (L-2 to L-4) 2/4 Achilles (S1) 2/4   Achilles (S1) 2/4  Plantars: Right: downgoing   Left:  downgoing Cerebellar: normal finger-to-nose,  normal heel-to-shin test Gait:  No tested due to multiple leads, safety reasons. CV: pulses palpable throughout    Results for orders placed or performed during the hospital encounter of 03/31/15 (from the past 48 hour(s))  Ethanol     Status: None   Collection Time: 03/31/15 10:30 AM  Result Value Ref Range   Alcohol, Ethyl (B) <5 <5 mg/dL    Comment:        LOWEST DETECTABLE LIMIT FOR SERUM ALCOHOL IS 5 mg/dL FOR MEDICAL PURPOSES ONLY   Protime-INR     Status: None   Collection Time: 03/31/15 10:30 AM  Result Value Ref Range   Prothrombin Time 14.4 11.6 - 15.2 seconds   INR 1.10 0.00 - 1.49  APTT     Status: None   Collection Time: 03/31/15 10:30 AM  Result Value Ref Range   aPTT 27 24 - 37 seconds  CBC     Status: None   Collection Time: 03/31/15 10:30 AM  Result Value Ref Range   WBC 6.3 4.0 - 10.5 K/uL   RBC 4.28 4.22 - 5.81 MIL/uL   Hemoglobin 13.4 13.0 - 17.0 g/dL   HCT 40.1 39.0 - 52.0 %   MCV 93.7 78.0 - 100.0 fL   MCH 31.3 26.0 - 34.0 pg   MCHC 33.4 30.0 - 36.0 g/dL   RDW 12.7 11.5 - 15.5 %   Platelets 233 150 - 400 K/uL  Differential     Status: Abnormal   Collection Time: 03/31/15 10:30 AM  Result Value Ref Range   Neutrophils Relative % 40 (L) 43 - 77 %   Lymphocytes Relative 25 12 - 46 %   Monocytes Relative 7 3 - 12 %   Eosinophils Relative 27 (H) 0 - 5 %   Basophils Relative 1 0 - 1 %   Neutro Abs 2.5 1.7 - 7.7 K/uL   Lymphs Abs 1.6 0.7 - 4.0 K/uL   Monocytes Absolute 0.4 0.1 - 1.0 K/uL   Eosinophils Absolute 1.7 (H) 0.0 - 0.7 K/uL   Basophils Absolute 0.1 0.0 - 0.1 K/uL  Comprehensive metabolic panel     Status: Abnormal   Collection Time: 03/31/15 10:30 AM  Result Value Ref Range   Sodium 139 135 - 145 mmol/L   Potassium 4.1 3.5 - 5.1 mmol/L   Chloride 108 101 - 111 mmol/L   CO2 22 22 - 32 mmol/L   Glucose, Bld 103 (H) 65 - 99 mg/dL   BUN 27 (H) 6 - 20 mg/dL   Creatinine, Ser 1.27 (H) 0.61 -  1.24 mg/dL   Calcium 8.8 (L) 8.9 - 10.3 mg/dL   Total Protein 6.4 (L) 6.5 - 8.1 g/dL   Albumin 3.6 3.5 - 5.0 g/dL   AST 20 15 - 41 U/L   ALT 20 17 - 63 U/L   Alkaline Phosphatase 97 38 - 126 U/L   Total Bilirubin 0.7 0.3 - 1.2 mg/dL  GFR calc non Af Amer 54 (L) >60 mL/min   GFR calc Af Amer >60 >60 mL/min    Comment: (NOTE) The eGFR has been calculated using the CKD EPI equation. This calculation has not been validated in all clinical situations. eGFR's persistently <60 mL/min signify possible Chronic Kidney Disease.    Anion gap 9 5 - 15  Troponin I     Status: None   Collection Time: 03/31/15 10:30 AM  Result Value Ref Range   Troponin I <0.03 <0.031 ng/mL    Comment:        NO INDICATION OF MYOCARDIAL INJURY.   I-stat troponin, ED (not at [facility], [facility])     Status: None   Collection Time: 03/31/15 10:41 AM  Result Value Ref Range   Troponin i, poc 0.00 0.00 - 0.08 ng/mL   Comment 3            Comment: Due to the release kinetics of cTnI, a negative result within the first hours of the onset of symptoms does not rule out myocardial infarction with certainty. If myocardial infarction is still suspected, repeat the test at appropriate intervals.   I-Stat Chem 8, ED  (not at [facility], [facility])     Status: Abnormal   Collection Time: 03/31/15 10:43 AM  Result Value Ref Range   Sodium 141 135 - 145 mmol/L   Potassium 4.1 3.5 - 5.1 mmol/L   Chloride 107 101 - 111 mmol/L   BUN 30 (H) 6 - 20 mg/dL   Creatinine, Ser 1.20 0.61 - 1.24 mg/dL   Glucose, Bld 103 (H) 65 - 99 mg/dL   Calcium, Ion 1.16 1.13 - 1.30 mmol/L   TCO2 21 0 - 100 mmol/L   Hemoglobin 14.3 13.0 - 17.0 g/dL   HCT 42.0 39.0 - 52.0 %   Ct Head Wo Contrast  03/31/2015   CLINICAL DATA:  Aphasia, weakness. Difficulty with speech this morning.  EXAM: CT HEAD WITHOUT CONTRAST  TECHNIQUE: Contiguous axial images were obtained from the base of the skull through the vertex without intravenous contrast.  COMPARISON:  09/28/2014  MRI  FINDINGS: Low-density throughout the deep white matter most compatible with chronic microvascular changes. No acute intracranial abnormality. Specifically, no hemorrhage, hydrocephalus, mass lesion, acute infarction, or significant intracranial injury. No acute calvarial abnormality.  Mucosal thickening in the paranasal sinuses. Mastoid air cells are clear.  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 No acute intracranial abnormality.   Electronically Signed   By: Rolm Baptise M.D.   On: 03/31/2015 11:12    Assessment: 74 y.o. male with acute onset motor dysphasia  but no other focal findings on neuro-exam. CT brain without acute abnormality. Suspect acute left cortical infarct. Patient is out of the window for thrombolysis. Admit to medicine. Complete stroke work up. Plavix pending results of stroke testing. Stroke team will follow up tomorrow.  Stroke Risk Factors - age,  HTN, hyperlipidemia, CAD, prior stroke.  Plan: 1. HgbA1c, fasting lipid panel 2. MRI, MRA  of the brain without contrast 3. Echocardiogram 4. Carotid dopplers 5. Prophylactic therapy-PLAVIX 6. Risk factor modification 7. Telemetry monitoring 8. Frequent neuro checks 9. PT/OT SLP  Dorian Pod, MD [facility] [phone]  03/31/2015, 12:13 PM

## 2015-03-31 NOTE — Progress Notes (Signed)
[facility] Admission History and Physical Service Pager: [phone]  Patient name: Aaron Whitaker Medical record number: [id number] Date of birth: 09/14/1940 Age: 74 y.o. Gender: male  Primary Care Provider: Fae Pippin Consultants: Neurology Code Status: DNR, confirmed on admission  Chief Complaint: Aphasia  Assessment and Plan: Aaron Whitaker is a 74 y.o. male presenting with expressive aphasia . PMH is significant for CAD, prior stroke x2, HLD, HTN, PFO s/p repair  Expressive Aphasia. Concern for TIA vs CVA. Head CT negative. Out of window for tPA since last known well 14 hours PTA (patient unable to say for certain if his symptoms started at church or were present when waking up). Patient with known history of 2 previous ischemic strokes. Will need stroke work up and aggressive risk factor modification.  - Neurology involved, appreciate assistance - Monitor on telemetry - Will obtain MR/MRA imaging - Will obtain carotid dopplers and TTE - Will check FLP and A1c - Continue home atorvastatin 40mg  - consider increasing dose - Continue home plavix - consider adding aspirin or changing to aggrenox - PT/OT/SLP consulted  CAD / HLD / HTN. History of MI in 2007, s/p CABG x5. On home coreg and plavix.  - Will hold home coreg given bradycardia (50s in ED), and to allow for permissive hypertension for 24 hours - Continue home statin and antiplatelets as above  Prediabetes. A1c 6.0% in January. Not currently on any medications. - Will obtain A1c - Consider starting Metformin at discharge  FEN/GI: NPO until passes swallow eval, then heart healthy diet, SLIV Prophylaxis: SubQ heparin  Disposition: Admitted to telemetry under attending Dr Andria Frames pending above work up.   History of Present Illness: Aaron Whitaker is a 74 y.o. male presenting with expressive aphasia. History is provided by the patient and family.  Patient reports that he went  to sleep around 10pm last night in his normal state of health. During church this morning around 830am, patient and his wife noticed that he had difficulty finding words. Per the patient this is the first symptom he noticed symptoms. Per the wife, the only thing the patient said this morning before church was that her coffee was getting cold. Per wife, patient is usually not very talkative in the morning and this did not seem out of the ordinary. Patient is unable to say if he felt different this morning before church or not.   Patient and family currently feel like his symptoms are improving. Patient denies any difficulty swallowing or eating this morning. No vision changes. No focal weakness or numbness. No headache.   Of note, patient has been recently seen at neurology for unsteady gait. Per the patient's wife, his symptoms improved with PT and he is not due for follow up for another year. Per chart review, it appears that the patient had an acute ischemic stroke 6 months ago. Patient additional reports a previous stroke in 2012 and a stroke in 2007 during his CABG procedure.   Review Of Systems: Per HPI with the following additions: No chest pain or shortness of breath. Otherwise 12 point review of systems was performed and was unremarkable.  Patient Active Problem List   Diagnosis Date Noted  . CAD (coronary artery disease), native coronary artery 11/17/2011  . Hemiparesis and alteration of sensations as late effects of stroke 11/17/2011  . Hyperlipidemia 11/17/2011  . Essential hypertension, benign 11/17/2011  . ASD (atrial septal defect) 11/17/2011   Past Medical History: Past Medical History  Diagnosis Date  . Hypertension   . Hyperlipidemia   . Stroke 2007; 12/2010    "light"; denies residuals  . Ulcerative colitis   . Pernicious anemia   . Hyperthyroidism   . Hx of CABG   . MI (myocardial infarction) 03/2006  . Blood transfusion   . Lower GI bleeding 1987    "bleeding ulcers;  got 6 pints of blood"  . Arthritis   . Prostate cancer ~2005    S/P radiation; seed implants  . Heart disease   . Skin cancer    Past Surgical History: Past Surgical History  Procedure Laterality Date  . Patent foramen ovale closure  11/17/11  . Coronary artery bypass graft  02/2006    CABG X 5  . Gastric resection  ~ 1987    "had ~ 90% of my stomach removed"  . 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 Knee arthroscopy      bilaterally; "long long time ago" (11/17/11)  . Patent foramen ovale closure N/A 11/17/2011    Procedure: PATENT FORAMEN OVALE CLOSURE;  Surgeon: Laverda Page, MD;  Location: [facility] CATH LAB;  Service: Cardiovascular;  Laterality: N/A;   Social History: History  Substance Use Topics  . Smoking status: Former Smoker -- 0.50 packs/day for 8 years    Quit date: 08/31/1965  . Smokeless tobacco: Current User    Types: Chew     Comment: 2 pouches daily  . Alcohol Use: No   Please also refer to relevant sections of EMR.  Family History: Family History  Problem Relation Age of Onset  . Cancer Mother   . Other Father     house fire  . Alzheimer's disease Sister   . Prostate cancer Brother    Allergies and Medications: No Known Allergies No current facility-administered medications on file prior to encounter.   Current Outpatient Prescriptions on File Prior to Encounter  Medication Sig Dispense Refill  . atorvastatin (LIPITOR) 40 MG tablet Take 40 mg by mouth daily.    . carvedilol (COREG) 12.5 MG tablet Take 3.125 mg by mouth 2 (two) times daily with a meal.     . clopidogrel (PLAVIX) 75 MG tablet Take 1 tablet (75 mg total) by mouth daily. 90 tablet 4  . diphenhydrAMINE (BENADRYL) 25 MG tablet Take 25 mg by mouth every morning.    . fish oil-omega-3 fatty acids 1000 MG capsule Take 1,200 mg by mouth daily.    Marland Kitchen FLUTICASONE PROPIONATE, NASAL, NA Place 2 puffs into the nose every morning.    . Multiple Vitamin (MULTIVITAMIN) capsule Take 1 capsule by mouth  daily.      Objective: BP 159/73 mmHg  Pulse 52  Temp(Src) 97.9 F (36.6 C) (Oral)  Resp 17  SpO2 97% Exam: General: 74yo man in NAD lying in hospital bed Eyes: PERRL, sclera clear ENTM: MMM, O/P clear Neck: FROM, no deformities Cardiovascular: Bradycardic, regular, no murmurs appreciated Respiratory: NWOB on room air, CTAB Abdomen: +BS, S, NT, ND MSK: WWP, no cyanosis or edema Skin: No rashes or areas of skin breakdown Neuro: Alert and oriented. Mildly dysarthric speech with obvious word finding difficulties and abnormal prosody. CN2-12 intact. FNF intact. Good RAM. No pronator drift. Strength 5/5 in upper and lower extremities. Sensation to light touch intact throughout. Gait and Romberg deferred.  Psych: Normal affect and though content.   Labs and Imaging: CBC BMET   Recent Labs Lab 03/31/15 1030 03/31/15 1043  WBC 6.3  --  HGB 13.4 14.3  HCT 40.1 42.0  PLT 233  --     Recent Labs Lab 03/31/15 1043  NA 141  K 4.1  CL 107  BUN 30*  CREATININE 1.20  GLUCOSE 103*     Trop 0 Ethanol <5  EKG: Sinus bradycardia, no acute ischemic changes.  Ct Head Wo Contrast  03/31/2015   CLINICAL DATA:  Aphasia, weakness. Difficulty with speech this morning.  EXAM: CT HEAD WITHOUT CONTRAST  TECHNIQUE: Contiguous axial images were obtained from the base of the skull through the vertex without intravenous contrast.  COMPARISON:  09/28/2014 MRI  FINDINGS: Low-density throughout the deep white matter most compatible with chronic microvascular changes. No acute intracranial abnormality. Specifically, no hemorrhage, hydrocephalus, mass lesion, acute infarction, or significant intracranial injury. No acute calvarial abnormality.  Mucosal thickening in the paranasal sinuses. Mastoid air cells are clear.  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 No acute intracranial abnormality.   Electronically Signed   By: Rolm Baptise M.D.   On: 03/31/2015 11:12    Vivi Barrack, MD 03/31/2015, 11:34 AM PGY-2, [place] [facility] pager: [phone], text pages welcome

## 2015-03-31 NOTE — ED Notes (Signed)
Attempted report 

## 2015-03-31 NOTE — ED Provider Notes (Signed)
CSN: [id number]     Arrival date & time 03/31/15  [id number] History   First MD Initiated Contact with Patient 03/31/15 1000     Chief Complaint  Patient presents with  . 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 He went to bed last night asymptomatic and woke this morning with symptoms. He has had previous stroke and felt this morning he had another one. No extremity weakness, falls, headache, visual impairment, difficulty walking, nausea or vomiting. He denies pain. Symptoms are not felt to be progressing over time this morning.   The history is provided by the patient. No language interpreter was used.    Past Medical History  Diagnosis Date  . Hypertension   . Hyperlipidemia   . Stroke 2007; 12/2010    "light"; denies residuals  . Ulcerative colitis   . Pernicious anemia   . Hyperthyroidism   . Hx of CABG   . MI (myocardial infarction) 03/2006  . Blood transfusion   . Lower GI bleeding 1987    "bleeding ulcers; got 6 pints of blood"  . Arthritis   . Prostate cancer ~2005    S/P radiation; seed implants  . Heart disease   . Skin cancer    Past Surgical History  Procedure Laterality Date  . Patent foramen ovale closure  11/17/11  . Coronary artery bypass graft  02/2006    CABG X 5  . Gastric resection  ~ 1987    "had ~ 90% of my stomach removed"  . 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 Knee arthroscopy      bilaterally; "long long time ago" (11/17/11)  . Patent foramen ovale closure N/A 11/17/2011    Procedure: PATENT FORAMEN OVALE CLOSURE;  Surgeon: Laverda Page, MD;  Location: [facility] CATH LAB;  Service: Cardiovascular;  Laterality: N/A;   Family History  Problem Relation Age of Onset  . Cancer  Mother   . Other Father     house fire  . Alzheimer's disease Sister   . Prostate cancer Brother    History  Substance Use Topics  . Smoking status: Former Smoker -- 0.50 packs/day for 8 years    Quit date: 08/31/1965  . Smokeless tobacco: Current User    Types: Chew     Comment: 2 pouches daily  . Alcohol Use: No    Review of Systems  Constitutional: Negative for fever and chills.  Eyes: Negative.  Negative for visual disturbance.  Respiratory: Negative.  Negative for shortness of breath.   Cardiovascular: Negative.  Negative for chest pain.  Gastrointestinal: Negative.  Negative for nausea and abdominal pain.  Musculoskeletal: Negative.  Negative for neck pain.  Skin: Negative.  Negative for color change, pallor and wound.  Neurological: Positive for speech difficulty. Negative for syncope, facial asymmetry, weakness, numbness and headaches.      Allergies  Review of patient's allergies indicates no known allergies.  Home Medications   Prior to Admission medications   Medication Sig Start Date End Date Taking? Authorizing Provider  atorvastatin (LIPITOR) 40 MG tablet Take 40 mg by mouth daily.    Historical Provider, MD  carvedilol (COREG) 12.5 MG tablet Take 3.125 mg by mouth 2 (two) times daily with a meal.     Historical Provider,  MD  clopidogrel (PLAVIX) 75 MG tablet Take 1 tablet (75 mg total) by mouth daily. 10/08/14   Penni Bombard, MD  diphenhydrAMINE (BENADRYL) 25 MG tablet Take 25 mg by mouth every morning.    Historical Provider, MD  fish oil-omega-3 fatty acids 1000 MG capsule Take 1,200 mg by mouth daily.    Historical Provider, MD  FLUTICASONE PROPIONATE, NASAL, NA Place 2 puffs into the nose every morning.    Historical Provider, MD  Multiple Vitamin (MULTIVITAMIN) capsule Take 1 capsule by mouth daily.    Historical Provider, MD   BP 154/98 mmHg  Pulse 55  Temp(Src) 97.9 F (36.6 C) (Oral)  Resp 19  SpO2 97% Physical Exam  Constitutional: He is  oriented to person, place, and time. He appears well-developed and well-nourished.  HENT:  Head: Normocephalic.  Neck: Normal range of motion. Neck supple.  Cardiovascular: Normal rate and regular rhythm.   No carotid bruit.  Pulmonary/Chest: Effort normal and breath sounds normal.  Abdominal: Soft. Bowel sounds are normal. There is no tenderness. There is no rebound and no guarding.  Musculoskeletal: Normal range of motion.  Neurological: He is alert and oriented to person, place, and time.  He has difficult word finding and moderate slurring of speech. He is oriented, coordinated - finger-to-nose, heel-to-shin. No facial asymmetry.CN's 3-12 grossly intact.  Skin: Skin is warm and dry. No rash noted.  Psychiatric: He has a normal mood and affect.    ED Course  Procedures (including critical care time) Labs Review Labs Reviewed  ETHANOL  PROTIME-INR  APTT  CBC  DIFFERENTIAL  COMPREHENSIVE METABOLIC PANEL  URINE RAPID DRUG SCREEN, HOSP PERFORMED  URINALYSIS, ROUTINE W REFLEX MICROSCOPIC (NOT AT [facility])  TROPONIN I  I-STAT CHEM 8, ED  I-STAT TROPOININ, ED   Results for orders placed or performed during the hospital encounter of 03/31/15  CBC  Result Value Ref Range   WBC 6.3 4.0 - 10.5 K/uL   RBC 4.28 4.22 - 5.81 MIL/uL   Hemoglobin 13.4 13.0 - 17.0 g/dL   HCT 40.1 39.0 - 52.0 %   MCV 93.7 78.0 - 100.0 fL   MCH 31.3 26.0 - 34.0 pg   MCHC 33.4 30.0 - 36.0 g/dL   RDW 12.7 11.5 - 15.5 %   Platelets 233 150 - 400 K/uL  Differential  Result Value Ref Range   Neutrophils Relative % PENDING 43 - 77 %   Neutro Abs PENDING 1.7 - 7.7 K/uL   Band Neutrophils PENDING 0 - 10 %   Lymphocytes Relative PENDING 12 - 46 %   Lymphs Abs PENDING 0.7 - 4.0 K/uL   Monocytes Relative PENDING 3 - 12 %   Monocytes Absolute PENDING 0.1 - 1.0 K/uL   Eosinophils Relative PENDING 0 - 5 %   Eosinophils Absolute PENDING 0.0 - 0.7 K/uL   Basophils Relative PENDING 0 - 1 %   Basophils Absolute  PENDING 0.0 - 0.1 K/uL   WBC Morphology PENDING    RBC Morphology PENDING    Smear Review PENDING    nRBC PENDING 0 /100 WBC   Metamyelocytes Relative PENDING %   Myelocytes PENDING %   Promyelocytes Absolute PENDING %   Blasts PENDING %  I-Stat Chem 8, ED  (not at [facility], [facility])  Result Value Ref Range   Sodium 141 135 - 145 mmol/L   Potassium 4.1 3.5 - 5.1 mmol/L   Chloride 107 101 - 111 mmol/L   BUN 30 (H) 6 -  20 mg/dL   Creatinine, Ser 1.20 0.61 - 1.24 mg/dL   Glucose, Bld 103 (H) 65 - 99 mg/dL   Calcium, Ion 1.16 1.13 - 1.30 mmol/L   TCO2 21 0 - 100 mmol/L   Hemoglobin 14.3 13.0 - 17.0 g/dL   HCT 42.0 39.0 - 52.0 %  I-stat troponin, ED (not at [facility], [facility])  Result Value Ref Range   Troponin i, poc 0.00 0.00 - 0.08 ng/mL   Comment 3           Ct Head Wo Contrast  03/31/2015   CLINICAL DATA:  Aphasia, weakness. Difficulty with speech this morning.  EXAM: CT HEAD WITHOUT CONTRAST  TECHNIQUE: Contiguous axial images were obtained from the base of the skull through the vertex without intravenous contrast.  COMPARISON:  09/28/2014 MRI  FINDINGS: Low-density throughout the deep white matter most compatible with chronic microvascular changes. No acute intracranial abnormality. Specifically, no hemorrhage, hydrocephalus, mass lesion, acute infarction, or significant intracranial injury. No acute calvarial abnormality.  Mucosal thickening in the paranasal sinuses. Mastoid air cells are clear.  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 No acute intracranial abnormality.   Electronically Signed   By: Rolm Baptise M.D.   On: 03/31/2015 11:12    Imaging Review No results found.   EKG Interpretation   Date/Time:  Sunday March 31 2015 10:16:02 EDT Ventricular Rate:  56 PR Interval:  252 QRS Duration: 112 QT Interval:  440 QTC Calculation: 425 R Axis:   25 Text Interpretation:  Sinus rhythm Prolonged PR interval Anteroseptal  infarct, old Baseline wander in  lead(s) V4 No significant change since  last tracing Confirmed by Gerald Leitz ([id number]) on 03/31/2015 10:29:41  AM      MDM   Final diagnoses:  None    1. Expressive aphasia  Discussed with Dr. Armida Sans (neurology) who will consult.   Re-evaluation; symptoms unchanged, aphasia still present, now worsening or new symptoms. Patient remains alert, oriented, NAD. VSS  Discussed with [facility] who has accepted the patient for admission.   Charlann Lange, PA-C 03/31/15 [place], MD 03/31/15 1715

## 2015-03-31 NOTE — ED Notes (Signed)
Pt here with trouble speaking. sts woke up this way this am. sts he went to church and noticed his was having trouble talking to people. Pt slow to respond to questions.

## 2015-03-31 NOTE — ED Provider Notes (Signed)
Medical screening examination/treatment/procedure(s) were conducted as a shared visit with non-physician practitioner(s) and myself.  I personally evaluated the patient during the encounter.  I saw patient with APP and agree with the assessment.    Patient is presenting with difficulty finding words. Patient has past medical history significant for stroke and  PFO surgically repaired 2013. Patient's last known normal was yesterday night at 10 PM when he went to sleep with his wife. 16 hours since last known normal. Patient woke up feeling kind of strange went to church and then let, at church let a friend know that he was having trouble finding his words. On exam he's having mild word finding difficulty though he is processing normally. No weakness. Plan to workup for stroke including consulting neurology and admission.     EKG Interpretation   Date/Time:  Sunday March 31 2015 10:16:02 EDT Ventricular Rate:  56 PR Interval:  252 QRS Duration: 112 QT Interval:  440 QTC Calculation: 425 R Axis:   25 Text Interpretation:  Sinus rhythm Prolonged PR interval Anteroseptal  infarct, old Baseline wander in lead(s) V4 No significant change since  last tracing Confirmed by Gerald Leitz ([id number]) on 03/31/2015 10:29:41  AM       Courteney Julio Alm, MD 03/31/15 1031

## 2015-04-01 ENCOUNTER — Ambulatory Visit (HOSPITAL_COMMUNITY): Payer: Medicare Other

## 2015-04-01 ENCOUNTER — Other Ambulatory Visit (HOSPITAL_COMMUNITY): Payer: No Typology Code available for payment source

## 2015-04-01 DIAGNOSIS — Q211 Atrial septal defect: Secondary | ICD-10-CM | POA: Diagnosis not present

## 2015-04-01 DIAGNOSIS — E785 Hyperlipidemia, unspecified: Secondary | ICD-10-CM | POA: Diagnosis present

## 2015-04-01 DIAGNOSIS — I639 Cerebral infarction, unspecified: Secondary | ICD-10-CM

## 2015-04-01 DIAGNOSIS — I252 Old myocardial infarction: Secondary | ICD-10-CM | POA: Diagnosis not present

## 2015-04-01 DIAGNOSIS — I351 Nonrheumatic aortic (valve) insufficiency: Secondary | ICD-10-CM | POA: Diagnosis not present

## 2015-04-01 DIAGNOSIS — Q2112 Patent foramen ovale: Secondary | ICD-10-CM | POA: Insufficient documentation

## 2015-04-01 DIAGNOSIS — Z66 Do not resuscitate: Secondary | ICD-10-CM | POA: Diagnosis present

## 2015-04-01 DIAGNOSIS — I10 Essential (primary) hypertension: Secondary | ICD-10-CM | POA: Insufficient documentation

## 2015-04-01 DIAGNOSIS — I63412 Cerebral infarction due to embolism of left middle cerebral artery: Secondary | ICD-10-CM | POA: Insufficient documentation

## 2015-04-01 DIAGNOSIS — E784 Other hyperlipidemia: Secondary | ICD-10-CM | POA: Diagnosis not present

## 2015-04-01 DIAGNOSIS — I25709 Atherosclerosis of coronary artery bypass graft(s), unspecified, with unspecified angina pectoris: Secondary | ICD-10-CM | POA: Diagnosis not present

## 2015-04-01 DIAGNOSIS — R7309 Other abnormal glucose: Secondary | ICD-10-CM | POA: Diagnosis present

## 2015-04-01 DIAGNOSIS — Z7902 Long term (current) use of antithrombotics/antiplatelets: Secondary | ICD-10-CM | POA: Diagnosis not present

## 2015-04-01 DIAGNOSIS — I6330 Cerebral infarction due to thrombosis of unspecified cerebral artery: Secondary | ICD-10-CM | POA: Insufficient documentation

## 2015-04-01 DIAGNOSIS — Z951 Presence of aortocoronary bypass graft: Secondary | ICD-10-CM | POA: Diagnosis not present

## 2015-04-01 DIAGNOSIS — Z8673 Personal history of transient ischemic attack (TIA), and cerebral infarction without residual deficits: Secondary | ICD-10-CM

## 2015-04-01 DIAGNOSIS — I2510 Atherosclerotic heart disease of native coronary artery without angina pectoris: Secondary | ICD-10-CM | POA: Diagnosis not present

## 2015-04-01 DIAGNOSIS — Z87891 Personal history of nicotine dependence: Secondary | ICD-10-CM | POA: Diagnosis not present

## 2015-04-01 DIAGNOSIS — I2699 Other pulmonary embolism without acute cor pulmonale: Secondary | ICD-10-CM | POA: Diagnosis not present

## 2015-04-01 DIAGNOSIS — R4701 Aphasia: Secondary | ICD-10-CM | POA: Diagnosis not present

## 2015-04-01 LAB — LIPID PANEL
Cholesterol: 147 mg/dL (ref 0–200)
HDL: 29 mg/dL — ABNORMAL LOW (ref >40)
LDL Cholesterol: 88 mg/dL (ref 0–99)
TRIGLYCERIDES: 149 mg/dL (ref <150)
Total CHOL/HDL Ratio: 5.1 RATIO
VLDL: 30 mg/dL (ref 0–40)

## 2015-04-01 LAB — HEMOGLOBIN A1C
HEMOGLOBIN A1C: 6 % — AB (ref 4.8–5.6)
Mean Plasma Glucose: 126 mg/dL

## 2015-04-01 MED ORDER — ASPIRIN 81 MG PO CHEW
81.0000 mg | CHEWABLE_TABLET | Freq: Every day | ORAL | Status: DC
  Administered 2015-04-02 – 2015-04-03 (×2): 81 mg via ORAL
  Filled 2015-04-01 (×2): qty 1

## 2015-04-01 MED ORDER — ATORVASTATIN CALCIUM 80 MG PO TABS
80.0000 mg | ORAL_TABLET | Freq: Every day | ORAL | Status: DC
  Administered 2015-04-01: 80 mg via ORAL
  Filled 2015-04-01: qty 1

## 2015-04-01 MED ORDER — ASPIRIN 81 MG PO CHEW: 324.0000 mg | CHEWABLE_TABLET | Freq: Every day | ORAL | Status: DC

## 2015-04-01 MED ORDER — ASPIRIN 81 MG PO CHEW
324.0000 mg | CHEWABLE_TABLET | Freq: Every day | ORAL | Status: DC
  Administered 2015-04-01: 324 mg via ORAL
  Filled 2015-04-01: qty 4

## 2015-04-01 MED ORDER — ATORVASTATIN CALCIUM 40 MG PO TABS
40.0000 mg | ORAL_TABLET | Freq: Every day | ORAL | Status: DC
  Administered 2015-04-02 – 2015-04-03 (×2): 40 mg via ORAL
  Filled 2015-04-01 (×2): qty 1

## 2015-04-01 NOTE — Progress Notes (Signed)
SLP Cancellation Note  Patient Details Name: JAKAR LEEDOM MRN: [id number] DOB: [birth year]   Cancelled treatment:       Reason Eval/Treat Not Completed: Patient at procedure or test/unavailable.  SLP will follow up as able.   Gunnar Fusi, M.A., CCC-SLP [phone]  [place] 04/01/2015, 3:38 PM

## 2015-04-01 NOTE — Evaluation (Signed)
Physical Therapy Evaluation Patient Details Name: Aaron Whitaker MRN: [id number] DOB: [birth year] Today's Date: 04/01/2015   History of Present Illness  74 yo male with L premotor cortex infarct with PHX:  CABG, MI, stroke, HTN, CAD, GI bleed  Clinical Impression  Pt and wife discussed treatment options and decided on HHPT with follow up to outpatient, as he has been for outpt after a previous stroke several years ago.  He is very upbeat and motivated to get better, seeing an improvement in his speech.    Follow Up Recommendations Home health PT    Equipment Recommendations  None recommended by PT    Recommendations for Other Services       Precautions / Restrictions Precautions Precautions: Fall (telemetry) Restrictions Weight Bearing Restrictions: No      Mobility  Bed Mobility Overal bed mobility: Modified Independent                Transfers Overall transfer level: Modified independent               General transfer comment: needed to stop with standing and use a hand on the bed  Ambulation/Gait Ambulation/Gait assistance: Supervision Ambulation Distance (Feet): 150 Feet Assistive device: Rolling walker (2 wheeled);1 person hand held assist Gait Pattern/deviations: Step-through pattern;Wide base of support;Drifts right/left;Decreased stride length Gait velocity: reduced Gait velocity interpretation: Below normal speed for age/gender General Gait Details: pt has a list to R which is affected by LL E incoordination and generalized strength loss  Stairs            Wheelchair Mobility    Modified Rankin (Stroke Patients Only) Modified Rankin (Stroke Patients Only) Pre-Morbid Rankin Score: No symptoms Modified Rankin: Slight disability     Balance Overall balance assessment: Needs assistance Sitting-balance support: Feet supported Sitting balance-Leahy Scale: Good   Postural control: Posterior lean Standing balance support: Bilateral  upper extremity supported Standing balance-Leahy Scale: Fair Standing balance comment: fair- dynamic balance                             Pertinent Vitals/Pain Pain Assessment: No/denies pain    Home Living Family/patient expects to be discharged to:: Private residence Living Arrangements: Spouse/significant other Available Help at Discharge: Family;Available 24 hours/day Type of Home: House Home Access: Level entry     Home Layout: One level Home Equipment: Cane - single point;Walker - 2 wheels Additional Comments: equipment from previous stroke    Prior Function Level of Independence: Independent               Hand Dominance        Extremity/Trunk Assessment   Upper Extremity Assessment: Overall WFL for tasks assessed           Lower Extremity Assessment: Generalized weakness      Cervical / Trunk Assessment: Normal  Communication   Communication: Expressive difficulties;Other (comment) (slightly slurred speech)  Cognition Arousal/Alertness: Awake/alert Behavior During Therapy: WFL for tasks assessed/performed Overall Cognitive Status: Within Functional Limits for tasks assessed                      General Comments General comments (skin integrity, edema, etc.): Pt has some mild coordinatinon losses evident when tandem walking and sidestepping in crossover    Exercises        Assessment/Plan    PT Assessment Patient needs continued PT services  PT Diagnosis Difficulty walking   PT Problem  List Decreased strength;Decreased range of motion;Decreased activity tolerance;Decreased balance;Decreased mobility;Decreased coordination;Decreased safety awareness;Cardiopulmonary status limiting activity  PT Treatment Interventions DME instruction;Gait training;Functional mobility training;Therapeutic activities;Therapeutic exercise;Balance training;Neuromuscular re-education;Patient/family education   PT Goals (Current goals can be found  in the Care Plan section) Acute Rehab PT Goals Patient Stated Goal: To go home today PT Goal Formulation: With patient/family Time For Goal Achievement: 04/15/15 Potential to Achieve Goals: Good    Frequency Min 3X/week   Barriers to discharge   no challenges x PT need    Co-evaluation               End of Session   Activity Tolerance: Patient tolerated treatment well Patient left: in chair;with call bell/phone within reach;with chair alarm set Nurse Communication: Mobility status    Functional Assessment Tool Used: clinical judgment Functional Limitation: Mobility: Walking and moving around Mobility: Walking and Moving Around Current Status [id number]): At least 20 percent but less than 40 percent impaired, limited or restricted Mobility: Walking and Moving Around Goal Status [phone]): At least 20 percent but less than 40 percent impaired, limited or restricted    Time: 0830-0900 PT Time Calculation (min) (ACUTE ONLY): 30 min   Charges:   PT Evaluation $Initial PT Evaluation Tier I: 1 Procedure PT Treatments $Neuromuscular Re-education: 8-22 mins   PT G Codes:   PT G-Codes **NOT FOR INPATIENT CLASS** Functional Assessment Tool Used: clinical judgment Functional Limitation: Mobility: Walking and moving around Mobility: Walking and Moving Around Current Status [id number]): At least 20 percent but less than 40 percent impaired, limited or restricted Mobility: Walking and Moving Around Goal Status [phone]): At least 20 percent but less than 40 percent impaired, limited or restricted    Ramond Dial 04/01/2015, 9:45 AM   Mee Hives, PT MS Acute Rehab Dept. Number: [facility] [id number] and [place] [phone]

## 2015-04-01 NOTE — Progress Notes (Addendum)
74 y.o. M admitted from home where he lived with wife,  for evaluation of expressive aphasia. Found to have nonhemorrhagic stroke. PT recommending HHPT.   Will notify MD of need for orders and arrange at discharge if ordered.

## 2015-04-01 NOTE — Procedures (Addendum)
TRANSCRANIAL DOPPLER BUBBLE STUDY  CURITS VINING  Date of Birth: March 23, 1941 Medical Record Number: [id number] Indications: Embolic stroke Date of Procedure: 04/01/15 Clinical History: embolic stroke Technical Description: Transcranial Doppler Bubble Study was performed at the bedside after taking written informed consent from the patient and explaining risk/benefits. The left middle cerebral artery was insonated using a hand held probe. And IV line had been previously inserted in the right forearm by the RN using aseptic precautions. Agitated saline injection at rest  did result in >100 high intensity transient signals (HITS). After valsalva maneuver, it generated bubble shower.   Impression: Positive Transcranial Doppler Bubble Study indicative of a Moderate to large right to left intracardiac, consistent with previous PFO diagnosis and PFO closure.  Results were explained to the patient. Questions were answered.  Rosalin Hawking, MD PhD Stroke Neurology 04/01/2015 8:14 PM

## 2015-04-01 NOTE — Progress Notes (Signed)
Family Medicine Teaching Service Daily Progress Note Intern Pager: [phone]  Patient name: Aaron Whitaker Medical record number: [id number] Date of birth: December 29, 1940 Age: 74 y.o. Gender: male  Primary Care Provider: Fae Pippin Consultants: Neurology Code Status: DNR, confirmed on admission  Pt Overview and Major Events to Date:  7/31: admitted for expressive aphasia  Assessment and Plan:  ORVELL DERENZO is a 74 y.o. male presenting with expressive aphasia . 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 Patient with known history of 2 previous ischemic strokes.  - Neurology following, appreciate recs - Tele - Will obtain carotid dopplers and TTE - Loop recorder- talk to cards - F/u A1c, FLP largely wnl -  atorvastatin to 40mg   - Continue home plavix - add aspirin per neuro -  - PT/OT/SLP consulted  CAD / HLD / HTN. History of MI in 2007, s/p CABG x5. On home coreg and plavix.  - consider restaring home coreg given beyond 24 hour window  - Continue home statin and antiplatelets as above  Prediabetes. A1c 6.0% in January. Not currently on any medications. - F/u A1c - Consider starting Metformin at discharge   FEN/GI: NPO until passes swallow eval, then heart healthy diet, SLIV Prophylaxis: SubQ heparin  Disposition: Admitted to telemetry under attending Dr Andria Frames pending above work up.   Subjective:  Working with PT when seen, feels aphasia 90 % better, denies chest pain, SOB, weakness  Objective: Temp:  [97.6 F (36.4 C)-97.9 F (36.6 C)] 97.9 F (36.6 C) (08/01 0547) Pulse Rate:  [52-66] 60 (08/01 0547) Resp:  [13-22] 16 (08/01 0547) BP: (129-173)/(59-98) 169/75 mmHg (08/01 0547) SpO2:  [95 %-100 %] 98 % (08/01 0547) Physical Exam: General: NAD Cardiovascular: RRR Respiratory: CTAB Abdomen: soft, nt, + BS Extremities: WWP, no cyanosis or  edema  Cranial Nerves II - XII - III, IV, VI - Extraocular movements intact. VII - Facial movement intact bilaterally. VIII - Hearing & vestibular intact bilaterally. X - Palate elevates symmetrically, no dysarthria. XI - Chin turning & shoulder shrug intact bilaterally. XII - Tongue protrusion intact.  Motor Strength - The patient's strength was 5/5 in all extremities. Bulk was normal and fasciculations were absent.   Coordination - The patient had normal movements in the hands with no ataxia or dysmetria. Tremor was absent.  Gait and Station - deferred due to safety concerns.  Laboratory:  Recent Labs Lab 03/31/15 1030 03/31/15 1043  WBC 6.3  --   HGB 13.4 14.3  HCT 40.1 42.0  PLT 233  --     Recent Labs Lab 03/31/15 1030 03/31/15 1043  NA 139 141  K 4.1 4.1  CL 108 107  CO2 22  --   BUN 27* 30*  CREATININE 1.27* 1.20  CALCIUM 8.8*  --   PROT 6.4*  --   BILITOT 0.7  --   ALKPHOS 97  --   ALT 20  --   AST 20  --   GLUCOSE 103* 103*   EKG: Sinus bradycardia, no acute ischemic changes.  Ct Head Wo Contrast  03/31/2015 CLINICAL DATA: Aphasia, weakness. Difficulty with speech this morning. EXAM: CT HEAD WITHOUT CONTRAST TECHNIQUE: Contiguous axial images were obtained from the base of the skull through the vertex without intravenous contrast. COMPARISON: 09/28/2014 MRI FINDINGS: Low-density throughout the deep white matter most compatible with chronic microvascular changes. No acute intracranial abnormality. Specifically,  no hemorrhage, hydrocephalus, mass lesion, acute infarction, or significant intracranial injury. No acute calvarial abnormality. Mucosal thickening in the paranasal sinuses. Mastoid air cells are clear. 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 No acute intracranial abnormality. Electronically Signed By: Rolm Baptise M.D. On: 03/31/2015 [place], MD 04/01/2015, 8:30 AM PGY-2, [place] Intern pager: [phone], text pages welcome

## 2015-04-01 NOTE — Progress Notes (Signed)
Chaplain responded to spiritual care consult for advanced directive. Pt already has an advanced directive and living will. Chaplain made copies. No chart available at this time, nurse informed and aware of location of copy, in drawer outside of pt room. Pt and family currently visiting with pastor. No other needs reported at this time. Page chaplain as needed.    04/01/15 1000  Clinical Encounter Type  Visited With Patient and family together  Visit Type Initial  Referral From Nurse  Consult/Referral To Faith community  Stress Factors  Family Stress Factors Lack of knowledge  Advance Directives (For Healthcare)  Does patient have an advance directive? Yes  Type of Paramedic of [place];Living will  Copy of advanced directive(s) in chart? Yes  Makenleigh Crownover, Barbette Hair, Chaplain 04/01/2015 10:32 AM

## 2015-04-01 NOTE — Progress Notes (Signed)
STROKE TEAM PROGRESS NOTE   HISTORY VLADYSLAV MERITHEW is a 74 y.o. male with a past medical history significant for HTN, hyperlipidemia, CAD s/p CABG, MI, left frontal ischemic infarct in 2016 without residual deficits, s/p PFO closure in 2013, comes in accompanied by his wife for evaluation of acute onset language difficulty. Patient's wife report that she last saw him normal last night at 9 pm. Then, he woke up this morning with difficulty getting his words out. He said that he knows what he wants to say but just can not get it out in a proper manner. He denies having trouble with comprehension of language.  No HA, vertigo, double vision, difficulty swallowing, focal weakness or numbness, unsteadiness, or visual disturbances.  Has been on plavix. CT brain was personally reviewed and showed no acute abnormality. ETOH level<5, wbc 6.3, Cr 1.27  Date last known well: 03/30/15 Time last known well: 9 pm  tPA Given: no, out of the window NIHSS: 2 MRS: 0   SUBJECTIVE (INTERVAL HISTORY) [name] is at bedside. The patient's speech has improved but is still not back to baseline. Dr. Erlinda Hong recommended a TEE and possible loop implant. These procedures were described to the patient and his family.    OBJECTIVE Temp:  [97.5 F (36.4 C)-97.9 F (36.6 C)] 97.8 F (36.6 C) (08/01 1413) Pulse Rate:  [56-66] 63 (08/01 1413) Cardiac Rhythm:  [-] Heart block (08/01 0800) Resp:  [14-18] 18 (08/01 1413) BP: (129-169)/(59-89) 145/77 mmHg (08/01 1413) SpO2:  [95 %-100 %] 100 % (08/01 1413)  No results for input(s): GLUCAP in the last 168 hours.  Recent Labs Lab 03/31/15 1030 03/31/15 1043  NA 139 141  K 4.1 4.1  CL 108 107  CO2 22  --   GLUCOSE 103* 103*  BUN 27* 30*  CREATININE 1.27* 1.20  CALCIUM 8.8*  --     Recent Labs Lab 03/31/15 1030  AST 20  ALT 20  ALKPHOS 97  BILITOT 0.7  PROT 6.4*  ALBUMIN 3.6    Recent Labs Lab 03/31/15 1030 03/31/15 1043  WBC 6.3  --    NEUTROABS 2.5  --   HGB 13.4 14.3  HCT 40.1 42.0  MCV 93.7  --   PLT 233  --     Recent Labs Lab 03/31/15 1030  TROPONINI <0.03    Recent Labs  03/31/15 1030  LABPROT 14.4  INR 1.10    Recent Labs  03/31/15 1130  COLORURINE YELLOW  LABSPEC 1.021  PHURINE 6.0  GLUCOSEU NEGATIVE  HGBUR NEGATIVE  BILIRUBINUR NEGATIVE  KETONESUR NEGATIVE  PROTEINUR NEGATIVE  UROBILINOGEN 0.2  NITRITE NEGATIVE  LEUKOCYTESUR NEGATIVE       Component Value Date/Time   CHOL 147 04/01/2015 0540   TRIG 149 04/01/2015 0540   HDL 29* 04/01/2015 0540   CHOLHDL 5.1 04/01/2015 0540   VLDL 30 04/01/2015 0540   LDLCALC 88 04/01/2015 0540   Lab Results  Component Value Date   HGBA1C 6.0* 03/31/2015      Component Value Date/Time   LABOPIA NONE DETECTED 03/31/2015 1131   COCAINSCRNUR NONE DETECTED 03/31/2015 1131   LABBENZ NONE DETECTED 03/31/2015 1131   AMPHETMU NONE DETECTED 03/31/2015 1131   THCU NONE DETECTED 03/31/2015 1131   LABBARB NONE DETECTED 03/31/2015 1131     Recent Labs Lab 03/31/15 1030  ETH <5   Imaging I have personally reviewed the radiological images below and agree with the radiology interpretations.  Ct Head Wo Contrast  03/31/2015    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 No acute intracranial abnormality.     Mr Jodene Nam Head/brain Wo Cm 04/01/2015    1. 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  2. Otherwise stable diffuse atrophy and white matter disease compatible with chronic microvascular ischemia.  3. Stable fusiform dilation of the high cervical right internal carotid artery.  4. 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  5. Moderate diffuse small vessel disease.    PHYSICAL EXAM  Temp:  [97.5 F (36.4 C)-98.1 F (36.7 C)] 98.1 F (36.7 C) (08/01 1753) Pulse Rate:  [56-65] 62 (08/01 1753) Resp:  [14-18]  18 (08/01 1753) BP: (129-169)/(59-89) 140/81 mmHg (08/01 1753) SpO2:  [95 %-100 %] 96 % (08/01 1753)  General - Well nourished, well developed, in no apparent distress.  Ophthalmologic - Sharp disc margins OU.  Cardiovascular - Regular rate and rhythm with no murmur.  Mental Status -  Level of arousal and orientation to time, place, and person were intact. 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 Fund of Knowledge was assessed and was intact.  Cranial Nerves II - XII - II - Visual field intact OU. III, IV, VI - Extraocular movements intact. V - Facial sensation intact bilaterally. VII - Facial movement intact bilaterally. VIII - Hearing & vestibular intact bilaterally. 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 XII - Tongue protrusion intact.  Motor Strength - The patient's strength was normal in all extremities and pronator drift was absent.  Bulk was normal and fasciculations were absent.   Motor Tone - Muscle tone was assessed at the neck and appendages and was normal.  Reflexes - The patient's reflexes were 1+ in all extremities and he had no pathological reflexes.  Sensory - Light touch, temperature/pinprick, and Romberg testing were assessed and were symmetrical.    Coordination - The patient had normal movements in the hands and feet with no ataxia or dysmetria.  Tremor was absent.  Gait and Station - The patient's transfers, posture, gait, station, and turns were observed as normal.   ASSESSMENT/PLAN Mr. Caysen Trella Mcclees is a 74 y.o. male with history of HTN, hyperlipidemia, CAD s/p CABG, MI, left frontal ischemic infarct in 2016 without residual deficits, s/p PFO closure in 2013 presenting with acute onset language difficulty.  He did not receive IV  t-PA due to late presentation.   Stroke:  Dominant infarct probably embolic, secondary to unknown source. Patient had PFO closure in 2013 due to strokes and large PFO.   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   2D Echo - pending  TCD with bubbles - spencer grade IV at rest, grade V with valsalva  LDL 88  HgbA1c 6.0  Subcutaneous heparin for VTE prophylaxis Diet Heart Room service appropriate?: Yes; Fluid consistency:: Thin  clopidogrel 75 mg orally every day prior to admission, now on aspirin 81 mg orally every day  and clopidogrel 75 mg orally every day Due to intracranial stenosis. Plan for dural antiplatelet for 3 months and then Plavix alone.   Patient counseled to be compliant with his antithrombotic medications  Ongoing aggressive stroke risk factor management  Therapy recommendations:  Home health physical therapy recommended. Speech therapy evaluation pending.  Disposition:  Pending  Hx of embolic strokes  Stroke in 2007 Preoperative period of CABG  Right MCA small embolic infarct [id number]  Punctate left frontal infarcts 08/2014  Left MCA small embolic infarct [id number]  PFO closure by Dr. Einar Gip in 2013  Followed by Dr. Lonni Fix in [place]  TCD with bubble study showed spencer grade 4 at rest, Grade 5 with Valsalva  LE venous doppler negative for DVT  Recommend TEE and possible loop recorder.  Hypertension  Home meds:  Coreg 3.125 mg twice daily  Stable  Permissive hypertension <220/120 for 24-48 hours and then gradually normalize within 5-7 days  Patient counseled to be compliant with his blood pressure medications  Hyperlipidemia  Home meds:  Lipitor 40 mg daily resumed in  hospital  LDL 88, goal < 70  Continue statin at discharge  Other Stroke Risk Factors  Advanced age  Cigarette smoker, quit smoking 49 years ago.  Hx stroke/TIA  Coronary artery disease  Other Active Problems  Low HDL  Other Pertinent History  Remote history of GI bleed - decrease aspirin to 81 mg daily along with Plavix.  Has seen Dr. Tish Frederickson for neurology in the past - follow-up after discharge.  Hospital day # 1  Mikey Bussing PA-C [facility] Pager [phone] 04/01/2015, 5:09 PM  I, the attending vascular neurologist, have personally obtained a history, examined the patient, evaluated laboratory data, individually viewed imaging studies and agree with radiology interpretations. I obtained additional history from pt's wife at bedside. Together with the NP/PA, we formulated the assessment and plan of care which reflects our mutual decision.  I have made any additions or clarifications directly to the above note and agree with the findings and plan as currently documented.   74 year old male with history of hypertension, hyperlipidemia, multiple embolic stroke in the past with PFO closure was admitted for left MCA small infarct, embolic pattern. MRA showed left VA stenosis. LE venous Doppler negative, TCD is still showing PFO. Recommend TEE and loop recorder possible. Due to intracranial stenosis, recommend dural antiplatelet for 3 months. Continue statin. She will follow-up with Dr. Tish Frederickson as outpatient in [facility].  Rosalin Hawking, MD PhD Stroke Neurology 04/01/2015 8:06 PM        To contact Stroke Continuity provider, please refer to [email or web address] After hours, contact General Neurology

## 2015-04-01 NOTE — Progress Notes (Signed)
Echocardiogram 2D Echocardiogram has been performed.  04/01/2015 4:58 PM Maudry Mayhew, RVT, RDCS, RDMS

## 2015-04-01 NOTE — Discharge Summary (Signed)
[facility] Discharge Summary  Patient name: Aaron Whitaker Medical record number: [id number] Date of birth: [birth year] Age: 74 y.o. Gender: male Date of Admission: 03/31/2015  Date of Discharge: 04/02/2015 Admitting Physician: Zenia Resides, MD  Primary Care Provider: Fae Pippin Consultants: Neurology  Indication for Hospitalization:  Acute non-hemorrhagic stroke  Discharge Diagnoses/Problem List:  Patient Active Problem List   Diagnosis Date Noted  . Coronary artery disease involving coronary bypass graft of native heart with unspecified angina pectoris   . Cerebral thrombosis with cerebral infarction 04/01/2015  . Acute CVA (cerebrovascular accident) 04/01/2015  . Stroke   . Dyslipidemia (high LDL; low HDL)   . Cerebral infarction due to embolism of left middle cerebral artery   . PFO (patent foramen ovale)   . Essential hypertension   . History of right MCA stroke   . Aphasia 03/31/2015  . CAD (coronary artery disease), native coronary artery 11/17/2011  . Hemiparesis and alteration of sensations as late effects of stroke 11/17/2011  . Hyperlipidemia 11/17/2011  . Essential hypertension, benign 11/17/2011  . ASD (atrial septal defect) 11/17/2011      Disposition: Home  Discharge Condition: Stable  Discharge Exam:   Temp: [97.5 F (36.4 C)-98.3 F (36.8 C)] 98.3 F (36.8 C) (08/02 0516) Pulse Rate: [57-65] 57 (08/02 0516) Resp: [18] 18 (08/02 0516) BP: (117-154)/(62-87) 139/87 mmHg (08/02 0516) SpO2: [96 %-100 %] 99 % (08/02 0516) Physical Exam: General: NAD Cardiovascular: RRR Respiratory: CTAB Abdomen: soft, nt, + BS Extremities: WWP, no cyanosis or edema  Cranial Nerves II - XII - III, IV, VI - Extraocular movements intact. VII - Facial movement intact bilaterally. VIII - Hearing & vestibular intact bilaterally. X - Palate elevates symmetrically, no dysarthria. XI - Chin turning & shoulder shrug intact  bilaterally. XII - Tongue protrusion intact.  Motor Strength - The patient's strength was 5/5 in all extremities. Bulk was normal and fasciculations were absent.  Coordination - The patient had normal movements in the hands with no ataxia or dysmetria. Tremor was absent. Gait and Station - deferred due to safety concerns.    Brief Hospital Course:  Aaron Whitaker is a 74 y.o. male presented with expressive aphasia . PMH is significant for CAD, prior stroke x2, HLD, HTN, PFO s/p repair  On admission to the ED he had a negative CT head but an MRI which was positive for acute non hemorrhagic left premotor cortex infarct with high grade stenosis at the left vertebral artery. He did not get tPA as he was out side of the window for intervention (onset of symptoms time frame at least 14 hours prior to presentation).  Neurology was consulted and  Recommended TEE, transcranial bubble study for history of repaired PFO, carotid dopplers, lower extremity dopplers.   Risk stratifying labs were drawn with A1c 6.0, unchanged from previous A1c in 08/2014, and fasting lipid panel significant for low HDL.   He was maintained on his home Plavix but as this event occurred in the setting of Plavix use, aspirin was added to be continued with plavix for 3 months.  Loop recorder and TEE were performed prior to discharge.  His expressive aphasia improved while inpatient and after evaluation by speech pathology he was started on a heart healthy diet  Physical therapy recommended home health physical therapy   Issues for Follow Up:  1. TEE and loop recorder  2. Prediabetes- discuss life style modification 3. Home physical therapy- ordered as an outpatient  Significant Procedures:  Transcranial Bubble study-  Positive Transcranial Doppler Bubble Study indicative of a Moderate to large right to left intracardiac, consistent with previous PFO diagnosis and PFO closure. Lower extremity dopplers- Negative for  deep vein thrombosis Carotid dopplers- Negative for clinically significantt stenosis Echocardiogram- EF 55-60%, calcified mitral valve annulus else normal exam  Significant Labs and Imaging:   Recent Labs Lab 03/31/15 1030 03/31/15 1043 04/03/15 1201  WBC 6.3  --  7.4  HGB 13.4 14.3 14.0  HCT 40.1 42.0 41.4  PLT 233  --  223    Recent Labs Lab 03/31/15 1030 03/31/15 1043  NA 139 141  K 4.1 4.1  CL 108 107  CO2 22  --   GLUCOSE 103* 103*  BUN 27* 30*  CREATININE 1.27* 1.20  CALCIUM 8.8*  --   ALKPHOS 97  --   AST 20  --   ALT 20  --   ALBUMIN 3.6  --       Results/Tests Pending at Time of Discharge: None  Discharge Medications:    Medication List    TAKE these medications        aspirin 81 MG chewable tablet  Chew 1 tablet (81 mg total) by mouth daily.     atorvastatin 40 MG tablet  Commonly known as:  LIPITOR  Take 40 mg by mouth daily.     carvedilol 3.125 MG tablet  Commonly known as:  COREG  Take 3.125 mg by mouth 2 (two) times daily with a meal.     clopidogrel 75 MG tablet  Commonly known as:  PLAVIX  Take 1 tablet (75 mg total) by mouth daily.     diphenhydrAMINE 25 MG tablet  Commonly known as:  BENADRYL  Take 25 mg by mouth daily as needed for allergies.     Fish Oil 1200 MG Caps  Take 1,200 mg by mouth daily.     fluticasone 50 MCG/ACT nasal spray  Commonly known as:  FLONASE  Place 2 sprays into both nostrils every morning.     multivitamin capsule  Take 1 capsule by mouth daily.        Discharge Instructions: Please refer to Patient Instructions section of EMR for full details.  Patient was counseled important signs and symptoms that should prompt return to medical care, changes in medications, dietary instructions, activity restrictions, and follow up appointments.   Follow-Up Appointments: Follow-up Information    Follow up with [facility], MD In 2 months.   Specialties:  Neurology, Radiology   Why:  Stroke Clinic,  Office will call you with appointment date & time   Contact information:   [place] [address] [phone]       Follow up with [facility] On 04/11/2015.   Why:  at [facility] information:   [address] [place] [phone]       Veatrice Bourbon, MD 04/03/2015, 1:21 PM PGY-2, [place]

## 2015-04-01 NOTE — Evaluation (Signed)
Occupational Therapy Evaluation and Discharge Patient Details Name: Aaron Whitaker MRN: [id number] DOB: [birth year] Today's Date: 04/01/2015    History of Present Illness 74 yo male with L premotor cortex infarct with PHX:  CABG, MI, stroke, HTN, CAD, GI bleed   Clinical Impression   This 74 yo male admitted with above presents to acute OT at a Mod I/Independent level for BADLs. No further OT needs, we will sign off.    Follow Up Recommendations  No OT follow up    Equipment Recommendations  None recommended by OT       Precautions / Restrictions Precautions Precautions: None Restrictions Weight Bearing Restrictions: No      Mobility  Transfers Overall transfer level: Independent               General transfer comment: ambulated from 4N21 to gym and back independently         ADL Overall ADL's : Independent;Modified independent                                       General ADL Comments: including stepping in and out of tub--wife to look into having grab bar(s) put in and maybe will borrow seat from family/friend. Pt able to stand and bend forward while in tub to wash his lower legs without LOB     Vision Additional Comments: No change from baseline          Pertinent Vitals/Pain Pain Assessment: No/denies pain     Hand Dominance Right   Extremity/Trunk Assessment Upper Extremity Assessment Upper Extremity Assessment: Overall WFL for tasks assessed     Communication Communication Communication:  (slurred speech)   Cognition Arousal/Alertness: Awake/alert Behavior During Therapy: WFL for tasks assessed/performed Overall Cognitive Status: Within Functional Limits for tasks assessed                                Home Living Family/patient expects to be discharged to:: Private residence Living Arrangements: Spouse/significant other Available Help at Discharge: Family;Available 24 hours/day Type of Home:  House Home Access: Level entry     Home Layout: One level         Bathroom Toilet: Standard Bathroom Accessibility: No   Home Equipment: Cane - single point;Walker - 2 wheels   Additional Comments: equipment from previous stroke      Prior Functioning/Environment Level of Independence: Independent             OT Diagnosis: Generalized weakness         OT Goals(Current goals can be found in the care plan section) Acute Rehab OT Goals Patient Stated Goal: To go home today  OT Frequency:                End of Session Equipment Utilized During Treatment:  (none)  Activity Tolerance: Patient tolerated treatment well Patient left:  (standing in room talking with MD)   [id number] OT Time Calculation (min): 17 min Charges:  OT General Charges $OT Visit: 1 Procedure OT Evaluation $Initial OT Evaluation Tier I: 1 Procedure G-Codes: OT G-codes **NOT FOR INPATIENT CLASS** Functional Assessment Tool Used: clinical observation Functional Limitation: Self care Self Care Current Status [id number]): At least 1 percent but less than 20 percent impaired, limited or restricted Self Care Goal Status [id number]): At least 1 percent but less than  20 percent impaired, limited or restricted Self Care Discharge Status [phone]): At least 1 percent but less than 20 percent impaired, limited or restricted  Aaron Whitaker [id number] 04/01/2015, 11:46 AM

## 2015-04-01 NOTE — Progress Notes (Signed)
*  PRELIMINARY RESULTS* Vascular Ultrasound Carotid Duplex (Doppler) has been completed.   Findings suggest 1-39% internal carotid artery stenosis bilaterally. Vertebral arteries are patent with antegrade flow.   Bilateral lower extremity venous duplex completed. Bilateral lower extremities are negative for deep vein thrombosis. There is no evidence of Baker's cyst bilaterally.   Transcranial Doppler with Bubbles has been completed.    04/01/2015 4:55 PM  Maudry Mayhew, RVT, RDCS, RDMS

## 2015-04-02 ENCOUNTER — Other Ambulatory Visit (HOSPITAL_COMMUNITY): Payer: No Typology Code available for payment source

## 2015-04-02 DIAGNOSIS — I10 Essential (primary) hypertension: Secondary | ICD-10-CM

## 2015-04-02 DIAGNOSIS — I639 Cerebral infarction, unspecified: Secondary | ICD-10-CM

## 2015-04-02 DIAGNOSIS — I63412 Cerebral infarction due to embolism of left middle cerebral artery: Principal | ICD-10-CM

## 2015-04-02 DIAGNOSIS — I25709 Atherosclerosis of coronary artery bypass graft(s), unspecified, with unspecified angina pectoris: Secondary | ICD-10-CM | POA: Insufficient documentation

## 2015-04-02 DIAGNOSIS — Q211 Atrial septal defect: Secondary | ICD-10-CM

## 2015-04-02 LAB — HEMOGLOBIN A1C
HEMOGLOBIN A1C: 6.1 % — AB (ref 4.8–5.6)
Mean Plasma Glucose: 128 mg/dL

## 2015-04-02 MED ORDER — CARVEDILOL 3.125 MG PO TABS
3.1250 mg | ORAL_TABLET | Freq: Two times a day (BID) | ORAL | Status: DC
  Administered 2015-04-02 (×2): 3.125 mg via ORAL
  Filled 2015-04-02 (×3): qty 1

## 2015-04-02 NOTE — Care Management Important Message (Signed)
Important Message  Patient Details  Name: Aaron Whitaker MRN: [id number] Date of Birth: 1940/10/27   Medicare Important Message Given:  Yes-second notification given    Delorse Lek 04/02/2015, 2:31 PM

## 2015-04-02 NOTE — Progress Notes (Signed)
    [facility] has been requested to perform a transesophageal echocardiogram on 04/02/2015 for stroke.  After careful review of history and examination, the risks and benefits of transesophageal echocardiogram have been explained including risks of esophageal damage, perforation (1:10,000 risk), bleeding, pharyngeal hematoma as well as other potential complications associated with conscious sedation including aspiration, arrhythmia, respiratory failure and death. Alternatives to treatment were discussed, questions were answered. Patient is willing to proceed.   74 yo male with PMH of HTN, HLD, CAD s/p CABG, prior stroke x 2 and s/p PFO closure in 2013 present with expressive aphasia. MRI of brain confirms L frontal lobe stroke. Pending TEE and loop recorder tomorrow. EP team already discussed loop recorder. Vital stable with BP 130-150s. Orders placed, tentatively arranged to have TEE at [facility] tomorrow.  Almyra Deforest, [place] 04/02/2015 5:36 PM

## 2015-04-02 NOTE — Progress Notes (Signed)
Family Medicine Teaching Service Daily Progress Note Intern Pager: [phone]  Patient name: Aaron Whitaker Medical record number: [id number] Date of birth: Oct 02, 1940 Age: 74 y.o. Gender: male  Primary Care Provider: Fae Pippin Consultants: Neurology Code Status: DNR, confirmed on admission  Pt Overview and Major Events to Date:  7/31: admitted for expressive aphasia  Assessment and Plan:  CASSANDRA ONDERDONK is a 74 y.o. male presenting with expressive aphasia . 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 Patient with known history of 2 previous ischemic strokes.  - Neurology following, appreciate recs - Tele - S/p Transcranial doppler study - S/p carotid dopplers. TTE, lower extremity dopplers f/u final report - Loop recorder/TEE scheduled for 8/3 at 9 am. Discuss whether this should be done as an outpatient -  A1c stable at 6, FLP largely wnl -  atorvastatin to 40mg   - Continue home plavix - add aspirin per neuro - PT/OT/SLP consulted  CAD / HLD / HTN. History of MI in 2007, s/p CABG x5. On home coreg and plavix.  - Continue home statin and antiplatelets as above - reorder home Co reg  Prediabetes. A1c 6.0% in January. Not currently on any medications. - A1c unchanged- consider metformin as an outpatient   FEN/GI:  Heart healthy diet, NPO after midnight in prep for TEE/loop recorder. Prophylaxis: SubQ heparin  Disposition: Admitted to telemetry under attending Dr Andria Frames pending above work up.   Subjective:  Pt sitting on couch with wife drinking coffee when seen. Feels aphasia has improved, denies  weakness, chest pain, SOB  Objective: Temp:  [97.5 F (36.4 C)-98.3 F (36.8 C)] 98.3 F (36.8 C) (08/02 0516) Pulse Rate:  [57-65] 57 (08/02 0516) Resp:  [18] 18 (08/02 0516) BP: (117-154)/(62-87) 139/87 mmHg (08/02 0516) SpO2:  [96 %-100 %] 99 %  (08/02 0516) Physical Exam: General: NAD Cardiovascular: RRR Respiratory: CTAB Abdomen: soft, nt, + BS Extremities: WWP, no cyanosis or edema   Laboratory:  Recent Labs Lab 03/31/15 1030 03/31/15 1043  WBC 6.3  --   HGB 13.4 14.3  HCT 40.1 42.0  PLT 233  --     Recent Labs Lab 03/31/15 1030 03/31/15 1043  NA 139 141  K 4.1 4.1  CL 108 107  CO2 22  --   BUN 27* 30*  CREATININE 1.27* 1.20  CALCIUM 8.8*  --   PROT 6.4*  --   BILITOT 0.7  --   ALKPHOS 97  --   ALT 20  --   AST 20  --   GLUCOSE 103* 103*   EKG: Sinus bradycardia, no acute ischemic changes.  Ct Head Wo Contrast  03/31/2015 CLINICAL DATA: Aphasia, weakness. Difficulty with speech this morning. EXAM: CT HEAD WITHOUT CONTRAST TECHNIQUE: Contiguous axial images were obtained from the base of the skull through the vertex without intravenous contrast. COMPARISON: 09/28/2014 MRI FINDINGS: Low-density throughout the deep white matter most compatible with chronic microvascular changes. No acute intracranial abnormality. Specifically, no hemorrhage, hydrocephalus, mass lesion, acute infarction, or significant intracranial injury. No acute calvarial abnormality. Mucosal thickening in the paranasal sinuses. Mastoid air cells are clear. 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 No acute intracranial abnormality. Electronically Signed By: Rolm Baptise M.D. On: 03/31/2015 11:12   8/1 Transcranial doppler study  Moderate to large right to left intracardiac, consistent with previous PFO diagnosis and PFO closure  Gitty Osterlund A  Lincoln Brigham, MD 04/02/2015, 7:32 AM PGY-2, [place] Intern pager: [phone], text pages welcome

## 2015-04-02 NOTE — Evaluation (Signed)
Speech Language Pathology Evaluation Patient Details Name: Aaron Whitaker MRN: [id number] DOB: [birth year] Today's Date: 04/02/2015 Time: [id number] SLP Time Calculation (min) (ACUTE ONLY): 20 min  Problem List:  Patient Active Problem List   Diagnosis Date Noted  . Cerebral thrombosis with cerebral infarction 04/01/2015  . Acute CVA (cerebrovascular accident) 04/01/2015  . Stroke   . Dyslipidemia (high LDL; low HDL)   . Cerebral infarction due to embolism of left middle cerebral artery   . PFO (patent foramen ovale)   . Essential hypertension   . History of right MCA stroke   . Aphasia 03/31/2015  . CAD (coronary artery disease), native coronary artery 11/17/2011  . Hemiparesis and alteration of sensations as late effects of stroke 11/17/2011  . Hyperlipidemia 11/17/2011  . Essential hypertension, benign 11/17/2011  . ASD (atrial septal defect) 11/17/2011   Past Medical History:  Past Medical History  Diagnosis Date  . Hypertension   . Hyperlipidemia   . Stroke 2007; 12/2010    "light"; denies residuals  . Ulcerative colitis   . Pernicious anemia   . Hyperthyroidism   . Hx of CABG   . MI (myocardial infarction) 03/2006  . Blood transfusion   . Lower GI bleeding 1987    "bleeding ulcers; got 6 pints of blood"  . Arthritis   . Prostate cancer ~2005    S/P radiation; seed implants  . Heart disease   . Skin cancer    Past Surgical History:  Past Surgical History  Procedure Laterality Date  . Patent foramen ovale closure  11/17/11  . Coronary artery bypass graft  02/2006    CABG X 5  . Gastric resection  ~ 1987    "had ~ 90% of my stomach removed"  . 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 Knee arthroscopy      bilaterally; "long long time ago" (11/17/11)  . Patent foramen ovale closure N/A 11/17/2011    Procedure: PATENT FORAMEN OVALE CLOSURE;  Surgeon: Laverda Page, MD;  Location: [facility] CATH LAB;  Service: Cardiovascular;  Laterality: N/A;   HPI:  74 year old  male with left premotor cortex infarct; past medical hostory ofCABG, MI, stroke, HTN, CAD, GI bleed   Assessment / Plan / Recommendation Clinical Impression  Patient presents with an unremarkable oral motor exam with mostly intelligible speech, which family reports is at baseline.  Patient's recall, receptive and expressive language is also [facility].  Education complete and patient is at baseline; as a result, no skilled SLP services are warranted at this time.       SLP Assessment  Patient does not need any further Speech Lanaguage Pathology Services    Follow Up Recommendations  None            Pertinent Vitals/Pain Pain Assessment: No/denies pain   SLP Goals  Progression toward goals:  (Eval) Patient/Family Stated Goal: to go home  SLP Evaluation Prior Functioning  Cognitive/Linguistic Baseline: Within functional limits (with basic) Type of Home: House  Lives With: Family Available Help at Discharge: Family;Available 24 hours/day Education: 6th grade Vocation: Retired   Associate Professor  Overall Cognitive Status: Within Functional Limits for tasks assessed    Comprehension  Auditory Comprehension Overall Auditory Comprehension: Appears within functional limits for tasks assessed Visual Recognition/Discrimination Discrimination: Within Function Limits Reading Comprehension Reading Status: Not tested (does not read at baseline)    Expression Expression Primary Mode of Expression: Verbal Verbal Expression Overall Verbal Expression: Appears within functional limits  for tasks assessed Written Expression Dominant Hand: Right Written Expression: Within Functional Limits (name )   Oral / Motor Oral Motor/Sensory Function Overall Oral Motor/Sensory Function: Appears within functional limits for tasks assessed Motor Speech Overall Motor Speech: Appears within functional limits for tasks assessed (family reports at baseline)   Foster, M.A.,  [place]  [place] 04/02/2015, 9:46 AM

## 2015-04-02 NOTE — Care Management Note (Signed)
Case Management Note  Patient Details  Name: Aaron Whitaker MRN: [id number] Date of Birth: 11/21/1940  Subjective/Objective:                    Action/Plan: Home health orders received. CM met with patient to discuss home health services. Patient states that PT just told him he no longer needs HH services at discharge.  This information was verified with Emily PT, who saw patient earlier this afternoon.  No additional discharge needs identified.  Expected Discharge Date:  04/02/15               Expected Discharge Plan:  Home/Self Care  In-House Referral:     Discharge planning Services  CM Consult  Post Acute Care Choice:    Choice offered to:  Patient  DME Arranged:    DME Agency:     HH Arranged:    HH Agency:     Status of Service:  Completed, signed off  Medicare Important Message Given:  Yes-second notification given Date Medicare IM Given:    Medicare IM give by:    Date Additional Medicare IM Given:    Additional Medicare Important Message give by:     If discussed at Long Length of Stay Meetings, dates discussed:    Additional Comments:  ,  C, RN 04/02/2015, 3:18 PM  

## 2015-04-02 NOTE — Patient Instructions (Signed)
Narrowing Stance: Eyes Shut   Stand facing forward, eyes closed. Stand ____ seconds then bring feet slightly closer together. Repeat until feet are touching. Repeat ____ times. Do____ sets.  http://bt.exer.us/260   Copyright  VHI. All rights reserved.  Narrowing Stance: Eyes Shut - Varied Head Position   With eyes closed, move head and neck: forward and back; side to side; diagonally high-to-low then low-to-high on each side. Then bring feet closer together. Repeat sequence until feet are touching. Repeat ____ times. Do ____ sets.  http://bt.exer.us/264   Copyright  VHI. All rights reserved.  Soft Mat: One-Leg Stand - Lateral Head Position   Balancing on one leg on mat, rotate head to look over one shoulder, then the other. Hold each position ____ seconds. Repeat on other leg. Do ____ repetitions, ____ sets.  http://bt.exer.us/332   Copyright  VHI. All rights reserved.  Soft Mat: One-Leg Stand - Flexion / Extension, Head Position   Balancing on one leg on mat, tilt head forward then backward. Hold each position ____ seconds. Repeat on other leg. Do ____ repetitions, ____ sets.  http://bt.exer.us/334   Copyright  VHI. All rights reserved.

## 2015-04-02 NOTE — Progress Notes (Signed)
Physical Therapy Treatment Patient Details Name: Aaron Whitaker MRN: [id number] DOB: 19-Sep-1940 Today's Date: 04/02/2015    History of Present Illness 74 yo male with L premotor cortex infarct with PHX:  CABG, MI, stroke, HTN, CAD, GI bleed    PT Comments    Pt making great progress with all aspects of therapy.  Was able to ambulate >300' today without AD at mod I level due to slower gait speed than normal.  Provided pt with HEP on high level balance exercises to perform at home.  Do not feel that he needs follow up and PT to sign off at this time.    Follow Up Recommendations  No PT follow up     Equipment Recommendations  None recommended by PT    Recommendations for Other Services       Precautions / Restrictions Precautions Precautions: None Restrictions Weight Bearing Restrictions: No    Mobility  Bed Mobility Overal bed mobility: Independent                Transfers Overall transfer level: Independent Equipment used: None                Ambulation/Gait Ambulation/Gait assistance: Modified independent (Device/Increase time) Ambulation Distance (Feet): 500 Feet Assistive device: None Gait Pattern/deviations: Step-through pattern Gait velocity: reduced   General Gait Details: Pt with no LOB during gait today.  Did have pt perform gait with varying speed changes, direction changes and gait with head turns.  Note slight staggering with horizontal head turns, but no overt LOB and able to recover on his own.    Stairs            Wheelchair Mobility    Modified Rankin (Stroke Patients Only) Modified Rankin (Stroke Patients Only) Pre-Morbid Rankin Score: No symptoms Modified Rankin: No significant disability     Balance Overall balance assessment: Modified Independent                                  Cognition Arousal/Alertness: Awake/alert Behavior During Therapy: WFL for tasks assessed/performed Overall Cognitive  Status: Within Functional Limits for tasks assessed       Memory:  (seemed to have some short term memory issues)              Exercises Other Exercises Other Exercises: Provided pt with HEP and performed with pt during session as follows; standing on compliant surface in corner of room with chair in front of him for support with feet getting closer together, EO>EC, EC w/ head turns progressing to SLS on foam with EO>EC and with head turns.  Pt verbalized understanding.      General Comments        Pertinent Vitals/Pain Pain Assessment: No/denies pain    Home Living                      Prior Function            PT Goals (current goals can now be found in the care plan section) Acute Rehab PT Goals Patient Stated Goal: To go home today PT Goal Formulation: All assessment and education complete, DC therapy Time For Goal Achievement: 04/15/15 Potential to Achieve Goals: Good Progress towards PT goals: Goals met/education completed, patient discharged from PT    Frequency       PT Plan Discharge plan needs to be updated    Co-evaluation  End of Session   Activity Tolerance: Patient tolerated treatment well Patient left: in chair;with call bell/phone within reach     Time: 7129-2909 PT Time Calculation (min) (ACUTE ONLY): 30 min  Charges:  $Neuromuscular Re-education: 23-37 mins                    G Codes:      Denice Bors 04/02/2015, 3:18 PM

## 2015-04-02 NOTE — Progress Notes (Signed)
STROKE TEAM PROGRESS NOTE   HISTORY KEE DEPA is a 74 y.o. male with a past medical history significant for HTN, hyperlipidemia, CAD s/p CABG, MI, left frontal ischemic infarct in 2016 without residual deficits, s/p PFO closure in 2013, comes in accompanied by his wife for evaluation of acute onset language difficulty. Patient's wife report that she last saw him normal last night at 9 pm. Then, he woke up this morning with difficulty getting his words out. He said that he knows what he wants to say but just can not get it out in a proper manner. He denies having trouble with comprehension of language.  No HA, vertigo, double vision, difficulty swallowing, focal weakness or numbness, unsteadiness, or visual disturbances.  Has been on plavix. CT brain was personally reviewed and showed no acute abnormality. ETOH level<5, wbc 6.3, Cr 1.27  Date last known well: 03/30/15 Time last known well: 9 pm  tPA Given: no, out of the window NIHSS: 2 MRS: 0   SUBJECTIVE (INTERVAL HISTORY) [name] is at bedside. Patient is up in chair. TEE scheduled for tomorrow. Wife feels best to stay and get it done, vs going home.   OBJECTIVE Temp:  [97.7 F (36.5 C)-98.3 F (36.8 C)] 97.7 F (36.5 C) (08/02 0936) Pulse Rate:  [57-64] 63 (08/02 0936) Cardiac Rhythm:  [-] Heart block (08/02 0359) Resp:  [18-20] 20 (08/02 0936) BP: (117-162)/(62-87) 162/87 mmHg (08/02 0936) SpO2:  [96 %-100 %] 99 % (08/02 0516)  No results for input(s): GLUCAP in the last 168 hours.  Recent Labs Lab 03/31/15 1030 03/31/15 1043  NA 139 141  K 4.1 4.1  CL 108 107  CO2 22  --   GLUCOSE 103* 103*  BUN 27* 30*  CREATININE 1.27* 1.20  CALCIUM 8.8*  --     Recent Labs Lab 03/31/15 1030  AST 20  ALT 20  ALKPHOS 97  BILITOT 0.7  PROT 6.4*  ALBUMIN 3.6    Recent Labs Lab 03/31/15 1030 03/31/15 1043  WBC 6.3  --   NEUTROABS 2.5  --   HGB 13.4 14.3  HCT 40.1 42.0  MCV 93.7  --   PLT 233   --     Recent Labs Lab 03/31/15 1030  TROPONINI <0.03    Recent Labs  03/31/15 1030  LABPROT 14.4  INR 1.10    Recent Labs  03/31/15 1130  COLORURINE YELLOW  LABSPEC 1.021  PHURINE 6.0  GLUCOSEU NEGATIVE  HGBUR NEGATIVE  BILIRUBINUR NEGATIVE  KETONESUR NEGATIVE  PROTEINUR NEGATIVE  UROBILINOGEN 0.2  NITRITE NEGATIVE  LEUKOCYTESUR NEGATIVE       Component Value Date/Time   CHOL 147 04/01/2015 0540   TRIG 149 04/01/2015 0540   HDL 29* 04/01/2015 0540   CHOLHDL 5.1 04/01/2015 0540   VLDL 30 04/01/2015 0540   LDLCALC 88 04/01/2015 0540   Lab Results  Component Value Date   HGBA1C 6.1* 04/01/2015      Component Value Date/Time   LABOPIA NONE DETECTED 03/31/2015 1131   COCAINSCRNUR NONE DETECTED 03/31/2015 1131   LABBENZ NONE DETECTED 03/31/2015 1131   AMPHETMU NONE DETECTED 03/31/2015 1131   THCU NONE DETECTED 03/31/2015 1131   LABBARB NONE DETECTED 03/31/2015 1131     Recent Labs Lab 03/31/15 1030  ETH <5    Ct Head Wo Contrast 03/31/2015    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 No acute intracranial abnormality.     Mri & Mra Head/brain Wo  Cm 04/01/2015    1. 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  2. Otherwise stable diffuse atrophy and white matter disease compatible with chronic microvascular ischemia.  3. Stable fusiform dilation of the high cervical right internal carotid artery.  4. 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  5. Moderate diffuse small vessel disease.   2D Echocardiogram  - Left ventricle: The cavity size was normal. There was mild focalbasal hypertrophy of the septum. Systolic function was normal.The estimated ejection fraction was in the range of 55% to 60%.Wall motion was normal; there were no regional wall motionabnormalities. Left ventricular diastolic function  parameterswere normal. - Aortic valve: There was trivial regurgitation. - Mitral valve: Calcified annulus. - Atrial septum: A closure device in the fossa ovalis region waspresent. No residual shunting by color Doppler exam. No evidenceof thrombus.  Carotid Doppler  There is 1-39% bilateral ICA stenosis. Vertebral artery flow is antegrade.    Lower Extremity Dopplers negative   PHYSICAL EXAM  Temp:  [97.7 F (36.5 C)-98.3 F (36.8 C)] 97.7 F (36.5 C) (08/02 0936) Pulse Rate:  [57-64] 63 (08/02 0936) Resp:  [18-20] 20 (08/02 0936) BP: (117-162)/(62-87) 162/87 mmHg (08/02 0936) SpO2:  [96 %-100 %] 99 % (08/02 0516)  General - Well nourished, well developed, in no apparent distress.  Ophthalmologic - Sharp disc margins OU.  Cardiovascular - Regular rate and rhythm with no murmur.  Mental Status -  Level of arousal and orientation to time, place, and person were intact. 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 Fund of Knowledge was assessed and was intact.  Cranial Nerves II - XII - II - Visual field intact OU. III, IV, VI - Extraocular movements intact. V - Facial sensation intact bilaterally. VII - Facial movement intact bilaterally. VIII - Hearing & vestibular intact bilaterally. 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 XII - Tongue protrusion intact.  Motor Strength - The patient's strength was normal in all extremities and pronator drift was absent.  Bulk was normal and fasciculations were absent.   Motor Tone - Muscle tone was assessed at the neck and appendages and was normal.  Reflexes - The patient's reflexes were 1+ in all extremities and he had no pathological reflexes.  Sensory - Light touch, temperature/pinprick, and Romberg testing were  assessed and were symmetrical.    Coordination - The patient had normal movements in the hands and feet with no ataxia or dysmetria.  Tremor was absent.  Gait and Station - The patient's transfers, posture, gait, station, and turns were observed as normal.   ASSESSMENT/PLAN [name] is a 74 y.o. male with history of HTN, hyperlipidemia, CAD s/p CABG, MI, left frontal ischemic infarct in 2016 without residual deficits, s/p PFO closure in 2013 presenting with acute onset language difficulty.  He did not receive IV t-PA due to late presentation.   Stroke:  Dominant infarct probably embolic, secondary to unknown source. Patient had PFO closure in 2013 due to strokes and large PFO.   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  MRA  high-grade stenosis of the left vertebral artery  Carotid Doppler -  unremarkable   LE venous duplex - negative for deep vein thrombosis.   2D Echo No source of embolus   TCD with bubbles - spencer grade IV at rest, grade V with valsalva TEE to look for embolic source. Arranged for tomorrow.   If TEE negative, a [place] electrophysiologist will consult and consider placement of an implantable loop recorder to evaluate for atrial fibrillation as etiology of stroke. This has been explained to patient/family by Dr. Erlinda Hong and they are agreeable.   LDL 88  HgbA1c 6.0  Subcutaneous heparin for VTE prophylaxis Diet Heart Room service appropriate?: Yes; Fluid consistency:: Thin  clopidogrel 75 mg orally every day prior to admission, now on aspirin 81 mg orally every day and clopidogrel 75 mg orally every day Due to intracranial stenosis. Plan for dural antiplatelet for 3 months and then Plavix alone.   Patient counseled to be compliant with his antithrombotic medications  Ongoing aggressive stroke risk factor management  Therapy recommendations:   Home health physical therapy recommended. NO OT or ST  Disposition:  Home after TEE with HH PT  Hx of embolic strokes  Stroke in 2007 Preoperative period of CABG  Right MCA small embolic infarct [id number]  Punctate left frontal infarcts 08/2014  Left MCA small embolic infarct [id number]  PFO closure by Dr. Einar Gip in 2013  Followed by Dr. Lonni Fix in [place]  TCD with bubble study showed spencer grade 4 at rest, Grade 5 with Valsalva  LE venous doppler negative for DVT  TEE and possible loop recorder scheduled for tomorrow  Hypertension  Home meds:  Coreg 3.125 mg twice daily  Stable  Permissive hypertension <220/120 for 24-48 hours and then gradually normalize within 5-7 days  Patient counseled to be compliant with his blood pressure medications  Hyperlipidemia  Home meds:  Lipitor 40 mg daily resumed in hospital  LDL 88, goal < 70  Continue statin at discharge  Other Stroke Risk Factors  Advanced age  Cigarette smoker, quit smoking 49 years ago.  Hx stroke/TIA  Coronary artery disease  Other Active Problems  Low HDL  Other Pertinent History  Remote history of GI bleed - decrease aspirin to 81 mg daily along with Plavix.  Has seen Dr. Tish Frederickson for neurology in the past - follow-up after discharge. Order written  Hospital day # [place] for Pager information 04/02/2015 10:41 AM   I, the attending vascular neurologist, have personally obtained a history, examined the patient, evaluated laboratory data, individually viewed imaging studies and agree with radiology interpretations.  Together with the NP/PA, we formulated the assessment and plan of care which reflects our mutual decision.  I have made any additions or clarifications directly to the above note and agree with the findings and plan as currently documented.   74 year old male with history of HTN, HLD, CAD s/p CABG in 2007, GI bleeding, recurrent stroke with PFO  closure was admitted for left MCA small infarct. Patient had stroke in 2007 perioperative at time of CABG, right MCA small infarct 2012, and left frontal white matter punctate infarct 08/2014. Had a PFO closure in 2013. Repeat TEE showed spencer grade 4 at rest, grade 5 with Valsalva. Patient needed TEE and loop recorder to rule out cardial source of emboli.  Rosalin Hawking, MD PhD Stroke Neurology 04/02/2015 6:25 PM      To contact Stroke Continuity provider, please refer to [email or web address] After hours, contact  General Neurology

## 2015-04-02 NOTE — Consult Note (Signed)
ELECTROPHYSIOLOGY CONSULT NOTE  Patient ID: Aaron Whitaker MRN: [id number], DOB/AGE: 1941/06/24   Admit date: 03/31/2015 Date of Consult: 04/02/2015  Primary Physician: Aaron Whitaker Primary Cardiologist: previously Dr Aaron Whitaker Reason for Consultation: Cryptogenic stroke; recommendations regarding Implantable Loop Recorder  History of Present Illness Aaron Whitaker was admitted on 03/31/2015 with acute onset language difficulty.  Imaging demonstrated dominant infarct probably embolic.  He has had prior strokes and is s/p PFO closure in 2013.  He previously wore and event monitor in 2012 with no arrhythmias identified.  He has undergone workup for stroke including echocardiogram and carotid dopplers.  The patient has been monitored on telemetry which has demonstrated sinus rhythm with no arrhythmias.  Inpatient stroke work-up is to be completed with a TEE.   Echocardiogram this admission demonstrated EF 55-60%.  Lab work is reviewed.   Prior to admission, the patient denies chest pain, shortness of breath, dizziness, palpitations, or syncope.  They are recovering from their stroke with plans to return home at discharge.  EP has been asked to evaluate for placement of an implantable loop recorder to monitor for atrial fibrillation.  ROS is negative except as outlined above.    Past Medical History  Diagnosis Date  . Hypertension   . Hyperlipidemia   . Stroke 2007; 12/2010    "light"; denies residuals  . Ulcerative colitis   . Pernicious anemia   . Hyperthyroidism   . Hx of CABG   . MI (myocardial infarction) 03/2006  . Blood transfusion   . Lower GI bleeding 1987    "bleeding ulcers; got 6 pints of blood"  . Arthritis   . Prostate cancer ~2005    S/P radiation; seed implants  . Heart disease   . Skin cancer      Surgical History:  Past Surgical History  Procedure Laterality Date  . Patent foramen ovale closure  11/17/11  . Coronary artery bypass graft  02/2006   CABG X 5  . Gastric resection  ~ 1987    "had ~ 90% of my stomach removed"  . 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 Knee arthroscopy      bilaterally; "long long time ago" (11/17/11)  . Patent foramen ovale closure N/A 11/17/2011    Procedure: PATENT FORAMEN OVALE CLOSURE;  Surgeon: Aaron Page, MD;  Location: [facility] CATH LAB;  Service: Cardiovascular;  Laterality: N/A;     Prescriptions prior to admission  Medication Sig Dispense Refill Last Dose  . atorvastatin (LIPITOR) 40 MG tablet Take 40 mg by mouth daily.   03/30/2015 at Unknown time  . carvedilol (COREG) 3.125 MG tablet Take 3.125 mg by mouth 2 (two) times daily with a meal.    03/30/2015 at 2000  . clopidogrel (PLAVIX) 75 MG tablet Take 1 tablet (75 mg total) by mouth daily. 90 tablet 4 03/30/2015 at Unknown time  . fluticasone (FLONASE) 50 MCG/ACT nasal spray Place 2 sprays into both nostrils every morning.    03/30/2015 at Unknown time  . Multiple Vitamin (MULTIVITAMIN) capsule Take 1 capsule by mouth daily.   03/30/2015 at Unknown time  . Omega-3 Fatty Acids (FISH OIL) 1200 MG CAPS Take 1,200 mg by mouth daily.   03/30/2015 at Unknown time  . diphenhydrAMINE (BENADRYL) 25 MG tablet Take 25 mg by mouth daily as needed for allergies.    > 1 month    Inpatient Medications:  . aspirin  81 mg Oral Daily  . atorvastatin  40 mg Oral Daily  . carvedilol  3.125 mg Oral BID WC  . clopidogrel  75 mg Oral Daily  . heparin  5,000 Units Subcutaneous 3 times per day    Allergies: No Known Allergies  History   Social History  . Marital Status: Married    Spouse Name: Aaron Whitaker  . Number of Children: 0  . Years of Education: 9th   Occupational History  . retired Other   Social History Main Topics  . Smoking status: Former Smoker -- 0.50 packs/day for 8 years    Quit date: 08/31/1965  . Smokeless tobacco: Current User    Types: Chew     Comment: 2 pouches daily  . Alcohol Use: No  . Drug Use: No  . Sexual Activity: No    Other Topics Concern  . Not on file   Social History Narrative   Pt lives at home with spouse Aaron Whitaker.   Retired.   9th grade.   Right handed.   Caffeine None      Family History  Problem Relation Age of Onset  . Cancer Mother   . Other Father     house fire  . Alzheimer's disease Sister   . Prostate cancer Brother      Physical Exam: Filed Vitals:   04/02/15 0132 04/02/15 0516 04/02/15 0936 04/02/15 1350  BP: 117/62 139/87 162/87 136/62  Pulse: 64 57 63 59  Temp: 98.2 F (36.8 C) 98.3 F (36.8 C) 97.7 F (36.5 C) 97.6 F (36.4 C)  TempSrc: Oral Oral Oral Oral  Resp: 18 18 20 16   SpO2: 96% 99% 96% 98%    GEN- The patient is well appearing, alert and oriented x 3 today.   Head- normocephalic, atraumatic Eyes-  Sclera clear, conjunctiva pink Ears- hearing intact Oropharynx- clear Neck- supple, Lungs- Clear to ausculation bilaterally, normal work of breathing Heart- Regular rate and rhythm  GI- soft, NT, ND, + BS Extremities- no clubbing, cyanosis, or edema MS- no significant deformity or atrophy Skin- no rash or lesion Psych- euthymic mood, full affect   Labs:   Lab Results  Component Value Date   WBC 6.3 03/31/2015   HGB 14.3 03/31/2015   HCT 42.0 03/31/2015   MCV 93.7 03/31/2015   PLT 233 03/31/2015    Recent Labs Lab 03/31/15 1030 03/31/15 1043  NA 139 141  K 4.1 4.1  CL 108 107  CO2 22  --   BUN 27* 30*  CREATININE 1.27* 1.20  CALCIUM 8.8*  --   PROT 6.4*  --   BILITOT 0.7  --   ALKPHOS 97  --   ALT 20  --   AST 20  --   GLUCOSE 103* 103*     Radiology/Studies: Ct Head Wo Contrast 03/31/2015   CLINICAL DATA:  Aphasia, weakness. Difficulty with speech this morning.  EXAM: CT HEAD WITHOUT CONTRAST  TECHNIQUE: Contiguous axial images were obtained from the base of the skull through the vertex without intravenous contrast.  COMPARISON:  09/28/2014 MRI  FINDINGS: Low-density throughout the deep white matter most compatible with chronic  microvascular changes. No acute intracranial abnormality. Specifically, no hemorrhage, hydrocephalus, mass lesion, acute infarction, or significant intracranial injury. No acute calvarial abnormality.  Mucosal thickening in the paranasal sinuses. Mastoid air cells are clear.  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 No acute intracranial abnormality.   Electronically Signed   By: Rolm Baptise M.D.   On: 03/31/2015 11:12  Mr Brain Wo Contrast 03/31/2015   CLINICAL DATA:  Slurred speech this morning. Personal history of pain foramina bowel a closed in 2013. Previous infarct.  EXAM: MRI HEAD WITHOUT CONTRAST  MRA HEAD WITHOUT CONTRAST  TECHNIQUE: Multiplanar, multiecho pulse sequences of the brain and surrounding structures were obtained without intravenous contrast. Angiographic images of the head were obtained using MRA technique without contrast.  COMPARISON:  CT head without contrast from the same day. MRI brain 09/28/2014. MRA 01/17/2011  FINDINGS: MRI HEAD FINDINGS  An acute nonhemorrhagic infarct is present within the pre motor cortex on in images 38-44 of series 3.  Extensive periventricular and subcortical T2 changes bilaterally are not significantly changed from the prior exam. Abnormal signal within the left vertebral artery is compatible with slower partially occluded flow. Flow is present in the anterior circulation. The globes and orbits are intact. Stents mucosal thickening is present throughout the anterior paranasal sinuses. There is some fluid in the right mastoid air cells. No obstructing nasopharyngeal lesion is present.  MRA HEAD FINDINGS  There is fusiform dilation of the right internal carotid artery below the skullbase without significant interval change. The internal carotid arteries are otherwise within normal limits to the ICA termini bilaterally. The A1 and M1 segments are normal. The anterior communicating artery is patent. MCA bifurcations are within  normal limits. Moderate attenuation of MCA branch vessels bilaterally is similar to the prior exam.  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 There is marked attenuation of the distal vertebral artery, just proximal to the vertebrobasilar junction. The left PICA origin is visualized and intact. The right PICA origin is not visualized. The right AICA is present. The basilar artery is within normal limits. Both posterior cerebral arteries originate the basilar tip. The PCA branch vessels demonstrate mild distal small vessel attenuation.  IMPRESSION: 1. 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 2. Otherwise stable diffuse atrophy and white matter disease compatible with chronic microvascular ischemia. 3. Stable fusiform dilation of the high cervical right internal carotid artery. 4. 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 5. Moderate diffuse small vessel disease. These results were called by telephone at the time of interpretation on 03/31/2015 at 4:06 pm to Dr. Dimas Chyle , who verbally acknowledged these results.   Electronically Signed   By: San Morelle M.D.   On: 03/31/2015 16:06   Mr Jodene Nam Head/brain Wo Cm 04/01/2015   CLINICAL DATA:  Slurred speech this morning. Personal history of pain foramina bowel a closed in 2013. Previous infarct.  EXAM: MRI HEAD WITHOUT CONTRAST  MRA HEAD WITHOUT CONTRAST  TECHNIQUE: Multiplanar, multiecho pulse sequences of the brain and surrounding structures were obtained without intravenous contrast. Angiographic images of the head were obtained using MRA technique without contrast.  COMPARISON:  CT head without contrast from the same day. MRI brain 09/28/2014. MRA 01/17/2011  FINDINGS: MRI HEAD FINDINGS  An acute nonhemorrhagic infarct is present within the pre motor cortex on in images 38-44 of series 3.   Extensive periventricular and subcortical T2 changes bilaterally are not significantly changed from the prior exam. Abnormal signal within the left vertebral artery is compatible with slower partially occluded flow. Flow is present in the anterior circulation. The globes and orbits are intact. Stents mucosal thickening is present throughout the anterior paranasal sinuses. There is some fluid in the right mastoid  air cells. No obstructing nasopharyngeal lesion is present.  MRA HEAD FINDINGS  There is fusiform dilation of the right internal carotid artery below the skullbase without significant interval change. The internal carotid arteries are otherwise within normal limits to the ICA termini bilaterally. The A1 and M1 segments are normal. The anterior communicating artery is patent. MCA bifurcations are within normal limits. Moderate attenuation of MCA branch vessels bilaterally is similar to the prior exam.  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 There is marked attenuation of the distal vertebral artery, just proximal to the vertebrobasilar junction. The left PICA origin is visualized and intact. The right PICA origin is not visualized. The right AICA is present. The basilar artery is within normal limits. Both posterior cerebral arteries originate the basilar tip. The PCA branch vessels demonstrate mild distal small vessel attenuation.  IMPRESSION: 1. 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 2. Otherwise stable diffuse atrophy and white matter disease compatible with chronic microvascular ischemia. 3. Stable fusiform dilation of the high cervical right internal carotid artery. 4. 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 5. Moderate diffuse small vessel disease. These results were called by telephone at the time of interpretation on 03/31/2015 at  4:06 pm to Dr. Dimas Chyle , who verbally acknowledged these results.   Electronically Signed   By: San Morelle M.D.   On: 03/31/2015 16:06    12-lead ECG sinus rhythm, rate 56, 1st degree AV block, PR 252 All prior EKG's in EPIC reviewed with no documented atrial fibrillation  Telemetry sinus rhythm, short run of atrial tachycardia  Assessment and Plan:  1. Cryptogenic stroke The patient presents with cryptogenic stroke.  The patient has a TEE planned for tomorrow AM.  I spoke at length with the patient about monitoring for afib with either a 30 day event monitor or an implantable loop recorder.  Risks, benefits, and alteratives to implantable loop recorder were discussed with the patient today.   At this time, the patient is very clear in their decision to proceed with implantable loop recorder.   Wound care was reviewed with the patient (keep incision clean and dry for 3 days).  Wound check scheduled for 04/11/15 at 9AM  Please call with questions.   Patsey Berthold, NP 04/02/2015 3:40 PM   I have seen, examined the patient, and reviewed the above assessment and plan.  On exam, RRR  Changes to above are made where necessary.  Though he has cerebrovascular disease, my discussion with neurologist is that he is felt to have an embolic stroke which is unrelated.  He has had at least 3 prior strokes.  I would therefore advise ILR implantation for stroke prevention.  According to our cryptogenic stroke protocol, he is scheduled for TEE prior to ILR implant tomorrow.  Dr Lovena Le to implant ILR if TEE is unrevealing.  Co Sign: Thompson Grayer, MD 04/02/2015 10:47 PM

## 2015-04-03 ENCOUNTER — Encounter (HOSPITAL_COMMUNITY)
Admission: EM | Disposition: A | Discharge status: Home / Self Care | Payer: Self-pay | Source: Home / Self Care | Attending: Family Medicine

## 2015-04-03 ENCOUNTER — Inpatient Hospital Stay (HOSPITAL_COMMUNITY): Payer: Medicare Other

## 2015-04-03 ENCOUNTER — Encounter (HOSPITAL_COMMUNITY): Payer: Self-pay

## 2015-04-03 DIAGNOSIS — I351 Nonrheumatic aortic (valve) insufficiency: Secondary | ICD-10-CM

## 2015-04-03 HISTORY — PX: TEE WITHOUT CARDIOVERSION: SHX5443

## 2015-04-03 HISTORY — PX: EP IMPLANTABLE DEVICE: SHX172B

## 2015-04-03 LAB — CREATININE, SERUM
CREATININE: 1.24 mg/dL (ref 0.61–1.24)
GFR, EST NON AFRICAN AMERICAN: 56 mL/min — AB (ref >60)

## 2015-04-03 LAB — CBC
HEMATOCRIT: 41.4 % (ref 39.0–52.0)
Hemoglobin: 14 g/dL (ref 13.0–17.0)
MCH: 32.3 pg (ref 26.0–34.0)
MCHC: 33.8 g/dL (ref 30.0–36.0)
MCV: 95.4 fL (ref 78.0–100.0)
Platelets: 223 10*3/uL (ref 150–400)
RBC: 4.34 MIL/uL (ref 4.22–5.81)
RDW: 12.7 % (ref 11.5–15.5)
WBC: 7.4 10*3/uL (ref 4.0–10.5)

## 2015-04-03 SURGERY — ECHOCARDIOGRAM, TRANSESOPHAGEAL
Anesthesia: Moderate Sedation

## 2015-04-03 SURGERY — LOOP RECORDER INSERTION

## 2015-04-03 MED ORDER — ASPIRIN 81 MG PO CHEW: 81.0000 mg | CHEWABLE_TABLET | Freq: Every day | ORAL | Status: DC

## 2015-04-03 MED ORDER — FENTANYL CITRATE (PF) 100 MCG/2ML IJ SOLN
INTRAMUSCULAR | Status: DC | PRN
  Administered 2015-04-03: 25 ug via INTRAVENOUS

## 2015-04-03 MED ORDER — ONDANSETRON HCL 4 MG/2ML IJ SOLN: 4.0000 mg | Freq: Four times a day (QID) | INTRAMUSCULAR | Status: DC | PRN

## 2015-04-03 MED ORDER — SODIUM CHLORIDE 0.9 % IV SOLN: INTRAVENOUS | Status: DC

## 2015-04-03 MED ORDER — HEPARIN SODIUM (PORCINE) 5000 UNIT/ML IJ SOLN: 5000.0000 [IU] | Freq: Three times a day (TID) | INTRAMUSCULAR | Status: DC

## 2015-04-03 MED ORDER — MIDAZOLAM HCL 5 MG/ML IJ SOLN
INTRAMUSCULAR | Status: AC
  Filled 2015-04-03: qty 2

## 2015-04-03 MED ORDER — ACETAMINOPHEN 325 MG PO TABS: 325.0000 mg | ORAL_TABLET | ORAL | Status: DC | PRN

## 2015-04-03 MED ORDER — LIDOCAINE-EPINEPHRINE 1 %-1:100000 IJ SOLN
INTRAMUSCULAR | Status: AC
  Filled 2015-04-03: qty 1

## 2015-04-03 MED ORDER — FENTANYL CITRATE (PF) 100 MCG/2ML IJ SOLN
INTRAMUSCULAR | Status: AC
  Filled 2015-04-03: qty 4

## 2015-04-03 MED ORDER — MIDAZOLAM HCL 10 MG/2ML IJ SOLN
INTRAMUSCULAR | Status: DC | PRN
  Administered 2015-04-03: 2 mg via INTRAVENOUS

## 2015-04-03 MED ORDER — LIDOCAINE-EPINEPHRINE 1 %-1:100000 IJ SOLN
INTRAMUSCULAR | Status: DC | PRN
  Administered 2015-04-03: 20 mL

## 2015-04-03 MED ORDER — DIPHENHYDRAMINE HCL 50 MG/ML IJ SOLN
INTRAMUSCULAR | Status: AC
  Filled 2015-04-03: qty 1

## 2015-04-03 MED ORDER — BUTAMBEN-TETRACAINE-BENZOCAINE 2-2-14 % EX AERO
INHALATION_SPRAY | CUTANEOUS | Status: DC | PRN
  Administered 2015-04-03: 2 via TOPICAL

## 2015-04-03 SURGICAL SUPPLY — 2 items
LOOP REVEAL LINQSYS (Prosthesis & Implant Heart) ×1 IMPLANT
PACK LOOP INSERTION (CUSTOM PROCEDURE TRAY) ×2 IMPLANT

## 2015-04-03 NOTE — Interval H&P Note (Signed)
History and Physical Interval Note:  04/03/2015 9:10 AM  Rip Harbour DENNARD AIREY  has presented today for surgery, with the diagnosis of stroke  The various methods of treatment have been discussed with the patient and family. After consideration of risks, benefits and other options for treatment, the patient has consented to  Procedure(s): TRANSESOPHAGEAL ECHOCARDIOGRAM (TEE) (N/A) as a surgical intervention .  The patient's history has been reviewed, patient examined, no change in status, stable for surgery.  I have reviewed the patient's chart and labs.  Questions were answered to the patient's satisfaction.     Kirk Ruths

## 2015-04-03 NOTE — Discharge Instructions (Signed)
Call your primary care provider for follow up   Follow with Neurology and Cardiology:   Follow-up Information    Follow up with [facility], MD In 2 months.   Specialties:  Neurology, Radiology   Why:  Stroke Clinic, Office will call you with appointment date & time   Contact information:   [place] [address] [phone]       Follow up with [facility] On 04/11/2015.   Why:  at [facility] information:   [place] [phone]     Cardiac Event Monitoring A cardiac event monitor is a small recording device used to help detect abnormal heart rhythms (arrhythmias). The monitor is used to record heart rhythm when noticeable symptoms such as the following occur:  Fast heartbeats (palpitations), such as heart racing or fluttering.  Dizziness.  Fainting or light-headedness.  Unexplained weakness. The monitor is wired to two electrodes placed on your chest. Electrodes are flat, sticky disks that attach to your skin. The monitor can be worn for up to 30 days. You will wear the monitor at all times, except when bathing.  HOW TO USE YOUR CARDIAC EVENT MONITOR A technician will prepare your chest for the electrode placement. The technician will show you how to place the electrodes, how to work the monitor, and how to replace the batteries. Take time to practice using the monitor before you leave the office. Make sure you understand how to send the information from the monitor to your health care provider. This requires a telephone with a landline, not a cell phone. You need to:  Wear your monitor at all times, except when you are in water:  Do not get the monitor wet.  Take the monitor off when bathing. Do not swim or use a hot tub with it on.  Keep your skin clean. Do not put body lotion or moisturizer on your chest.  Change the electrodes daily or any time they stop sticking to your skin. You  might need to use tape to keep them on.  It is possible that your skin under the electrodes could become irritated. To keep this from happening, try to put the electrodes in slightly different places on your chest. However, they must remain in the area under your left breast and in the upper right section of your chest.  Make sure the monitor is safely clipped to your clothing or in a location close to your body that your health care provider recommends.  Press the button to record when you feel symptoms of heart trouble, such as dizziness, weakness, light-headedness, palpitations, thumping, shortness of breath, unexplained weakness, or a fluttering or racing heart. The monitor is always on and records what happened slightly before you pressed the button, so do not worry about being too late to get good information.  Keep a diary of your activities, such as walking, doing chores, and taking medicine. It is especially important to note what you were doing when you pushed the button to record your symptoms. This will help your health care provider determine what might be contributing to your symptoms. The information stored in your monitor will be reviewed by your health care provider alongside your diary entries.  Send the recorded information as recommended by your health care provider. It is important to understand that it will take some time for your health care provider to process the results.  Change the batteries as  recommended by your health care provider. SEEK IMMEDIATE MEDICAL CARE IF:   You have chest pain.  You have extreme difficulty breathing or shortness of breath.  You develop a very fast heartbeat that persists.  You develop dizziness that does not go away.  You faint or constantly feel you are about to faint. Document Released: 05/26/2008 Document Revised: 01/01/2014 Document Reviewed: 02/13/2013 [facility] Patient Information 2015 [place], [place]. This information is not  intended to replace advice given to you by your health care provider. Make sure you discuss any questions you have with your health care provider.

## 2015-04-03 NOTE — H&P (Signed)
Patient Name: Aaron Whitaker Date of Encounter: 04/03/2015     Active Problems:   Aphasia   Cerebral thrombosis with cerebral infarction   Stroke   Dyslipidemia (high LDL; low HDL)   Acute CVA (cerebrovascular accident)   Cerebral infarction due to embolism of left middle cerebral artery   PFO (patent foramen ovale)   Essential hypertension   History of right MCA stroke   Coronary artery disease involving coronary bypass graft of native heart with unspecified angina pectoris    SUBJECTIVE  No chest pain.  CURRENT MEDS . aspirin  81 mg Oral Daily  . atorvastatin  40 mg Oral Daily  . carvedilol  3.125 mg Oral BID WC  . clopidogrel  75 mg Oral Daily  . heparin  5,000 Units Subcutaneous 3 times per day    OBJECTIVE  Filed Vitals:   04/02/15 1752 04/02/15 2229 04/03/15 0143 04/03/15 0607  BP: 162/85 137/61 132/65 148/58  Pulse: 7 64 67 62  Temp: 97.9 F (36.6 C) 98.2 F (36.8 C) 97.8 F (36.6 C) 98 F (36.7 C)  TempSrc: Oral Oral Oral Oral  Resp: 16 18 18 18   SpO2: 99% 94% 96% 97%    Intake/Output Summary (Last 24 hours) at 04/03/15 0829 Last data filed at 04/02/15 1753  Gross per 24 hour  Intake    720 ml  Output      0 ml  Net    720 ml   There were no vitals filed for this visit.  PHYSICAL EXAM  General: Pleasant, NAD. Neuro: Alert and oriented X 3. Moves all extremities spontaneously. Psych: Normal affect. HEENT:  Normal  Neck: Supple without bruits or JVD. Lungs:  Resp regular and unlabored, CTA. Heart: RRR no s3, s4, or murmurs. Abdomen: Soft, non-tender, non-distended, BS + x 4.  Extremities: No clubbing, cyanosis or edema. DP/PT/Radials 2+ and equal bilaterally.  Accessory Clinical Findings  CBC  Recent Labs  03/31/15 1030 03/31/15 1043  WBC 6.3  --   NEUTROABS 2.5  --   HGB 13.4 14.3  HCT 40.1 42.0  MCV 93.7  --   PLT 233  --    Basic Metabolic Panel  Recent Labs  03/31/15 1030 03/31/15 1043  NA 139 141  K 4.1 4.1    CL 108 107  CO2 22  --   GLUCOSE 103* 103*  BUN 27* 30*  CREATININE 1.27* 1.20  CALCIUM 8.8*  --    Liver Function Tests  Recent Labs  03/31/15 1030  AST 20  ALT 20  ALKPHOS 97  BILITOT 0.7  PROT 6.4*  ALBUMIN 3.6   No results for input(s): LIPASE, AMYLASE in the last 72 hours. Cardiac Enzymes  Recent Labs  03/31/15 1030  TROPONINI <0.03   BNP Invalid input(s): POCBNP D-Dimer No results for input(s): DDIMER in the last 72 hours. Hemoglobin A1C  Recent Labs  04/01/15 0540  HGBA1C 6.1*   Fasting Lipid Panel  Recent Labs  04/01/15 0540  CHOL 147  HDL 29*  LDLCALC 88  TRIG 149  CHOLHDL 5.1   Thyroid Function Tests No results for input(s): TSH, T4TOTAL, T3FREE, THYROIDAB in the last 72 hours.  Invalid input(s): FREET3  TELE  nsr  Radiology/Studies  Ct Head Wo Contrast  03/31/2015   CLINICAL DATA:  Aphasia, weakness. Difficulty with speech this morning.  EXAM: CT HEAD WITHOUT CONTRAST  TECHNIQUE: Contiguous axial images were obtained from the base of the skull through the vertex  without intravenous contrast.  COMPARISON:  09/28/2014 MRI  FINDINGS: Low-density throughout the deep white matter most compatible with chronic microvascular changes. No acute intracranial abnormality. Specifically, no hemorrhage, hydrocephalus, mass lesion, acute infarction, or significant intracranial injury. No acute calvarial abnormality.  Mucosal thickening in the paranasal sinuses. Mastoid air cells are clear.  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 No acute intracranial abnormality.   Electronically Signed   By: Rolm Baptise M.D.   On: 03/31/2015 11:12   Mr Brain Wo Contrast  03/31/2015   CLINICAL DATA:  Slurred speech this morning. Personal history of pain foramina bowel a closed in 2013. Previous infarct.  EXAM: MRI HEAD WITHOUT CONTRAST  MRA HEAD WITHOUT CONTRAST  TECHNIQUE: Multiplanar, multiecho pulse sequences of the brain and  surrounding structures were obtained without intravenous contrast. Angiographic images of the head were obtained using MRA technique without contrast.  COMPARISON:  CT head without contrast from the same day. MRI brain 09/28/2014. MRA 01/17/2011  FINDINGS: MRI HEAD FINDINGS  An acute nonhemorrhagic infarct is present within the pre motor cortex on in images 38-44 of series 3.  Extensive periventricular and subcortical T2 changes bilaterally are not significantly changed from the prior exam. Abnormal signal within the left vertebral artery is compatible with slower partially occluded flow. Flow is present in the anterior circulation. The globes and orbits are intact. Stents mucosal thickening is present throughout the anterior paranasal sinuses. There is some fluid in the right mastoid air cells. No obstructing nasopharyngeal lesion is present.  MRA HEAD FINDINGS  There is fusiform dilation of the right internal carotid artery below the skullbase without significant interval change. The internal carotid arteries are otherwise within normal limits to the ICA termini bilaterally. The A1 and M1 segments are normal. The anterior communicating artery is patent. MCA bifurcations are within normal limits. Moderate attenuation of MCA branch vessels bilaterally is similar to the prior exam.  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 There is marked attenuation of the distal vertebral artery, just proximal to the vertebrobasilar junction. The left PICA origin is visualized and intact. The right PICA origin is not visualized. The right AICA is present. The basilar artery is within normal limits. Both posterior cerebral arteries originate the basilar tip. The PCA branch vessels demonstrate mild distal small vessel attenuation.  IMPRESSION: 1. 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 2. Otherwise stable diffuse atrophy and white matter  disease compatible with chronic microvascular ischemia. 3. Stable fusiform dilation of the high cervical right internal carotid artery. 4. 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 5. Moderate diffuse small vessel disease. These results were called by telephone at the time of interpretation on 03/31/2015 at 4:06 pm to Dr. Dimas Chyle , who verbally acknowledged these results.   Electronically Signed   By: San Morelle M.D.   On: 03/31/2015 16:06   Mr Jodene Nam Head/brain Wo Cm  04/01/2015   CLINICAL DATA:  Slurred speech this morning. Personal history of pain foramina bowel a closed in 2013. Previous infarct.  EXAM: MRI HEAD WITHOUT CONTRAST  MRA HEAD WITHOUT CONTRAST  TECHNIQUE: Multiplanar, multiecho pulse sequences of the brain and surrounding structures were obtained without intravenous contrast. Angiographic images of the head were obtained using MRA technique without contrast.  COMPARISON:  CT head without contrast from the same day. MRI  brain 09/28/2014. MRA 01/17/2011  FINDINGS: MRI HEAD FINDINGS  An acute nonhemorrhagic infarct is present within the pre motor cortex on in images 38-44 of series 3.  Extensive periventricular and subcortical T2 changes bilaterally are not significantly changed from the prior exam. Abnormal signal within the left vertebral artery is compatible with slower partially occluded flow. Flow is present in the anterior circulation. The globes and orbits are intact. Stents mucosal thickening is present throughout the anterior paranasal sinuses. There is some fluid in the right mastoid air cells. No obstructing nasopharyngeal lesion is present.  MRA HEAD FINDINGS  There is fusiform dilation of the right internal carotid artery below the skullbase without significant interval change. The internal carotid arteries are otherwise within normal limits to the ICA termini bilaterally. The A1 and M1 segments are  normal. The anterior communicating artery is patent. MCA bifurcations are within normal limits. Moderate attenuation of MCA branch vessels bilaterally is similar to the prior exam.  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 There is marked attenuation of the distal vertebral artery, just proximal to the vertebrobasilar junction. The left PICA origin is visualized and intact. The right PICA origin is not visualized. The right AICA is present. The basilar artery is within normal limits. Both posterior cerebral arteries originate the basilar tip. The PCA branch vessels demonstrate mild distal small vessel attenuation.  IMPRESSION: 1. 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 2. Otherwise stable diffuse atrophy and white matter disease compatible with chronic microvascular ischemia. 3. Stable fusiform dilation of the high cervical right internal carotid artery. 4. 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 5. Moderate diffuse small vessel disease. These results were called by telephone at the time of interpretation on 03/31/2015 at 4:06 pm to Dr. Dimas Chyle , who verbally acknowledged these results.   Electronically Signed   By: San Morelle M.D.   On: 03/31/2015 16:06    ASSESSMENT AND PLAN  1. Cryptogenic stroke Plan for TEE followed by ILR. I have discussed the risks/benefits/goals/expectations of ILR insertion and she wishes to proceed.  Gregg Taylor,M.D.  04/03/2015 8:29 AM

## 2015-04-03 NOTE — CV Procedure (Signed)
See full TEE report in [facility]; normal LV function; prior PFO closure device noted; positive saline microcavitation study consistent with residual shunt. Kirk Ruths

## 2015-04-03 NOTE — Progress Notes (Signed)
STROKE TEAM PROGRESS NOTE   HISTORY Aaron Whitaker is a 74 y.o. male with a past medical history significant for HTN, hyperlipidemia, CAD s/p CABG, MI, left frontal ischemic infarct in 2016 without residual deficits, s/p PFO closure in 2013, comes in accompanied by his wife for evaluation of acute onset language difficulty. Patient's wife report that she last saw him normal last night at 9 pm (LKW 03/30/2015 at 9p). Then, he woke up this morning with difficulty getting his words out. He said that he knows what he wants to say but just can not get it out in a proper manner. He denies having trouble with comprehension of language. No HA, vertigo, double vision, difficulty swallowing, focal weakness or numbness, unsteadiness, or visual disturbances. Has been on plavix. CT brain was personally reviewed and showed no acute abnormality. ETOH level<5, wbc 6.3, Cr 1.27  tPA Given: no, out of the window NIHSS: 2 MRS: 0   SUBJECTIVE (INTERVAL HISTORY) Wife at bedside. Pt has no complains. Had TEE showed normal LV function; prior PFO closure device noted; positive saline microcavitation study consistent with residual shunt. Loop recorder placed. And pt is waiting for discharge.   OBJECTIVE Temp:  [97.6 F (36.4 C)-98.2 F (36.8 C)] 98.2 F (36.8 C) (08/03 0838) Pulse Rate:  [7-67] 56 (08/03 0905) Cardiac Rhythm:  [-] Heart block (08/02 2014) Resp:  [15-20] 17 (08/03 0905) BP: (132-162)/(58-87) 157/76 mmHg (08/03 0905) SpO2:  [94 %-99 %] 98 % (08/03 0905)  No results for input(s): GLUCAP in the last 168 hours.  Recent Labs Lab 03/31/15 1030 03/31/15 1043  NA 139 141  K 4.1 4.1  CL 108 107  CO2 22  --   GLUCOSE 103* 103*  BUN 27* 30*  CREATININE 1.27* 1.20  CALCIUM 8.8*  --     Recent Labs Lab 03/31/15 1030  AST 20  ALT 20  ALKPHOS 97  BILITOT 0.7  PROT 6.4*  ALBUMIN 3.6    Recent Labs Lab 03/31/15 1030 03/31/15 1043  WBC 6.3  --   NEUTROABS 2.5  --   HGB 13.4 14.3   HCT 40.1 42.0  MCV 93.7  --   PLT 233  --     Recent Labs Lab 03/31/15 1030  TROPONINI <0.03    Recent Labs  03/31/15 1030  LABPROT 14.4  INR 1.10    Recent Labs  03/31/15 1130  COLORURINE YELLOW  LABSPEC 1.021  PHURINE 6.0  GLUCOSEU NEGATIVE  HGBUR NEGATIVE  BILIRUBINUR NEGATIVE  KETONESUR NEGATIVE  PROTEINUR NEGATIVE  UROBILINOGEN 0.2  NITRITE NEGATIVE  LEUKOCYTESUR NEGATIVE       Component Value Date/Time   CHOL 147 04/01/2015 0540   TRIG 149 04/01/2015 0540   HDL 29* 04/01/2015 0540   CHOLHDL 5.1 04/01/2015 0540   VLDL 30 04/01/2015 0540   LDLCALC 88 04/01/2015 0540   Lab Results  Component Value Date   HGBA1C 6.1* 04/01/2015      Component Value Date/Time   LABOPIA NONE DETECTED 03/31/2015 1131   COCAINSCRNUR NONE DETECTED 03/31/2015 1131   LABBENZ NONE DETECTED 03/31/2015 1131   AMPHETMU NONE DETECTED 03/31/2015 1131   THCU NONE DETECTED 03/31/2015 1131   LABBARB NONE DETECTED 03/31/2015 1131     Recent Labs Lab 03/31/15 1030  ETH <5   I have personally reviewed the radiological images below and agree with the radiology interpretations.  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 No  acute intracranial abnormality.     Mri & Mra Head/brain Wo Cm 04/01/2015    1. 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  2. Otherwise stable diffuse atrophy and white matter disease compatible with chronic microvascular ischemia.  3. Stable fusiform dilation of the high cervical right internal carotid artery.  4. 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  5. Moderate diffuse small vessel disease.   2D Echocardiogram  - Left ventricle: The cavity size was normal. There was mild focalbasal hypertrophy of the septum. Systolic function was normal.The estimated ejection fraction was in  the range of 55% to 60%.Wall motion was normal; there were no regional wall motionabnormalities. Left ventricular diastolic function parameterswere normal. - Aortic valve: There was trivial regurgitation. - Mitral valve: Calcified annulus. - Atrial septum: A closure device in the fossa ovalis region waspresent. No residual shunting by color Doppler exam. No evidenceof thrombus.  Carotid Doppler  There is 1-39% bilateral ICA stenosis. Vertebral artery flow is antegrade.    Lower Extremity Dopplers negative  TEE normal LV function; prior PFO closure device noted; positive saline microcavitation study consistent with residual shunt   PHYSICAL EXAM  Temp:  [97.6 F (36.4 C)-98.2 F (36.8 C)] 98.2 F (36.8 C) (08/03 0838) Pulse Rate:  [7-67] 56 (08/03 0905) Resp:  [15-20] 17 (08/03 0905) BP: (132-162)/(58-87) 157/76 mmHg (08/03 0905) SpO2:  [94 %-99 %] 98 % (08/03 0905)  General - Well nourished, well developed, in no apparent distress.  Ophthalmologic - Sharp disc margins OU.  Cardiovascular - Regular rate and rhythm with no murmur.  Mental Status -  Level of arousal and orientation to time, place, and person were intact. 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 Fund of Knowledge was assessed and was intact.  Cranial Nerves II - XII - II - Visual field intact OU. III, IV, VI - Extraocular movements intact. V - Facial sensation intact bilaterally. VII - Facial movement intact bilaterally. VIII - Hearing & vestibular intact bilaterally. 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 XII - Tongue protrusion intact.  Motor Strength - The patient's strength was normal in all extremities and pronator drift was absent.  Bulk was normal and fasciculations were  absent.   Motor Tone - Muscle tone was assessed at the neck and appendages and was normal.  Reflexes - The patient's reflexes were 1+ in all extremities and he had no pathological reflexes.  Sensory - Light touch, temperature/pinprick, and Romberg testing were assessed and were symmetrical.    Coordination - The patient had normal movements in the hands and feet with no ataxia or dysmetria.  Tremor was absent.  Gait and Station - The patient's transfers, posture, gait, station, and turns were observed as normal.   ASSESSMENT/PLAN Mr. Joanthan Bada Goldson is a 74 y.o. male with history of HTN, hyperlipidemia, CAD s/p CABG, MI, left frontal ischemic infarct in 2016 without residual deficits, s/p PFO closure in 2013 presenting with acute onset language difficulty.  He did not receive IV t-PA due to late presentation.   Stroke:  Dominant infarct probably embolic, secondary to unknown source. Patient had PFO closure in 2013 due to strokes and large PFO.   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   2D Echo No source of embolus   TCD with bubbles - spencer grade IV at rest, grade V with valsalva TEE normal LV function; prior PFO closure device noted; positive saline microcavitation study consistent with residual shunt. [facility] electrophysiologist has consulted and placed an implantable loop recorder to evaluate for atrial fibrillation as etiology of stroke today  LDL 88  HgbA1c 6.0  Subcutaneous heparin for VTE prophylaxis Diet NPO time specified Except for: Sips with Meds  clopidogrel 75 mg orally every day prior to admission, now on aspirin 81 mg orally every day and clopidogrel 75 mg orally every day Due to intracranial stenosis. Plan for  dural antiplatelet for 3 months and then Plavix alone.   Patient counseled to be compliant with his antithrombotic medications  Ongoing aggressive stroke risk factor management  Therapy recommendations:  Home health physical therapy recommended. NO OT or ST  Disposition:  Home after TEE with HH PT  Hx of embolic strokes  Stroke in 2007 Preoperative period of CABG  Right MCA small embolic infarct [id number]  Punctate left frontal infarcts 08/2014  Left MCA small embolic infarct [id number]  PFO closure by Dr. Einar Gip in 2013  Followed by Dr. Lonni Fix in [place]  TCD with bubble study showed spencer grade 4 at rest, Grade 5 with Valsalva  LE venous doppler negative for DVT  TEE showed PFO, no SOE  loop recorder placement by Dr. Lovena Le today  Hypertension  Home meds:  Coreg 3.125 mg twice daily  Stable  Permissive hypertension <220/120 for 24-48 hours and then gradually normalize within 5-7 days  Patient counseled to be compliant with his blood pressure medications  Hyperlipidemia  Home meds:  Lipitor 40 mg daily resumed in hospital  LDL 88, goal < 70  Continue statin at discharge  Other Stroke Risk Factors  Advanced age  Cigarette smoker, quit smoking 49 years ago.  Hx stroke/TIA  Coronary artery disease  Other Active Problems  Low HDL  Other Pertinent History  Remote history of GI bleed - decrease aspirin to 81 mg daily along with Plavix.  Has seen Dr. Tish Frederickson for neurology in the past - follow-up after discharge. Order written  Hospital day # 3   Neurology will sign off. Please call with questions. Pt will follow up with Dr. Tish Frederickson at [facility] in about 2 months. Thanks for the consult.  Rosalin Hawking, MD PhD Stroke Neurology 04/03/2015 10:08 PM    To contact Stroke Continuity provider, please refer to [email or web address] After hours, contact General Neurology

## 2015-04-03 NOTE — Progress Notes (Signed)
Family Medicine Teaching Service Daily Progress Note Intern Pager: [phone]  Patient name: Aaron Whitaker Medical record number: [id number] Date of birth: 1941-06-20 Age: 74 y.o. Gender: male  Primary Care Provider: Fae Pippin Consultants: Neurology Code Status: DNR, confirmed on admission  Pt Overview and Major Events to Date:  7/31: admitted for expressive aphasia  Assessment and Plan:  Aaron Whitaker is a 74 y.o. male presenting with expressive aphasia . 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 Patient with known history of 2 previous ischemic strokes.  - Neurology following, appreciate recs - Tele - S/p Transcranial doppler study - S/p carotid dopplers. TTE, lower extremity dopplers f/u final report - Loop recorder/TEE scheduled today 8/3. -  A1c stable at 6, FLP largely wnl -  atorvastatin to 40mg   - Continue home plavix - add aspirin per neuro - PT/OT/SLP consulted  CAD / HLD / HTN. History of MI in 2007, s/p CABG x5. On home coreg and plavix.  - Continue home statin and antiplatelets as above - reorder home Co reg  Prediabetes. A1c 6.0% in January. Not currently on any medications. - A1c unchanged- consider metformin as an outpatient   FEN/GI:  Heart healthy diet, NPO after midnight in prep for TEE/loop recorder. Prophylaxis: SubQ heparin  Disposition: To discharge today after TEE/loop recorder  Subjective:  Denies weakness, chest pain, SOB. Resolved aphasia  Objective: Temp:  [97.6 F (36.4 C)-98.2 F (36.8 C)] 97.9 F (36.6 C) (08/03 0939) Pulse Rate:  [7-67] 61 (08/03 0939) Resp:  [12-18] 13 (08/03 0939) BP: (132-169)/(58-87) 145/71 mmHg (08/03 0939) SpO2:  [94 %-99 %] 96 % (08/03 0939) Physical Exam: General: NAD Cardiovascular: RRR Respiratory: CTAB Abdomen: soft, nt, + BS Extremities: WWP, no cyanosis or  edema   Laboratory:  Recent Labs Lab 03/31/15 1030 03/31/15 1043  WBC 6.3  --   HGB 13.4 14.3  HCT 40.1 42.0  PLT 233  --     Recent Labs Lab 03/31/15 1030 03/31/15 1043  NA 139 141  K 4.1 4.1  CL 108 107  CO2 22  --   BUN 27* 30*  CREATININE 1.27* 1.20  CALCIUM 8.8*  --   PROT 6.4*  --   BILITOT 0.7  --   ALKPHOS 97  --   ALT 20  --   AST 20  --   GLUCOSE 103* 103*   EKG: Sinus bradycardia, no acute ischemic changes.  Ct Head Wo Contrast  03/31/2015 CLINICAL DATA: Aphasia, weakness. Difficulty with speech this morning. EXAM: CT HEAD WITHOUT CONTRAST TECHNIQUE: Contiguous axial images were obtained from the base of the skull through the vertex without intravenous contrast. COMPARISON: 09/28/2014 MRI FINDINGS: Low-density throughout the deep white matter most compatible with chronic microvascular changes. No acute intracranial abnormality. Specifically, no hemorrhage, hydrocephalus, mass lesion, acute infarction, or significant intracranial injury. No acute calvarial abnormality. Mucosal thickening in the paranasal sinuses. Mastoid air cells are clear. 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 No acute intracranial abnormality. Electronically Signed By: Rolm Baptise M.D. On: 03/31/2015 11:12   8/1 Transcranial doppler study  Moderate to large right to left intracardiac, consistent with previous PFO diagnosis and PFO closure  Veatrice Bourbon, MD 04/03/2015, 9:46 AM PGY-2, [facility] Intern pager: [phone], text pages welcome

## 2015-04-03 NOTE — Progress Notes (Signed)
Pt d/c to home by car with family. Assessment stable. All questions answered. Pt refuses to be taken out by wheelchair.

## 2015-04-03 NOTE — H&P (View-Only) (Signed)
STROKE TEAM PROGRESS NOTE   HISTORY Aaron Whitaker is a 74 y.o. male with a past medical history significant for HTN, hyperlipidemia, CAD s/p CABG, MI, left frontal ischemic infarct in 2016 without residual deficits, s/p PFO closure in 2013, comes in accompanied by his wife for evaluation of acute onset language difficulty. Patient's wife report that she last saw him normal last night at 9 pm. Then, he woke up this morning with difficulty getting his words out. He said that he knows what he wants to say but just can not get it out in a proper manner. He denies having trouble with comprehension of language.  No HA, vertigo, double vision, difficulty swallowing, focal weakness or numbness, unsteadiness, or visual disturbances.  Has been on plavix. CT brain was personally reviewed and showed no acute abnormality. ETOH level<5, wbc 6.3, Cr 1.27  Date last known well: 03/30/15 Time last known well: 9 pm  tPA Given: no, out of the window NIHSS: 2 MRS: 0   SUBJECTIVE (INTERVAL HISTORY) [name] is at bedside. Patient is up in chair. TEE scheduled for tomorrow. Wife feels best to stay and get it done, vs going home.   OBJECTIVE Temp:  [97.7 F (36.5 C)-98.3 F (36.8 C)] 97.7 F (36.5 C) (08/02 0936) Pulse Rate:  [57-64] 63 (08/02 0936) Cardiac Rhythm:  [-] Heart block (08/02 0359) Resp:  [18-20] 20 (08/02 0936) BP: (117-162)/(62-87) 162/87 mmHg (08/02 0936) SpO2:  [96 %-100 %] 99 % (08/02 0516)  No results for input(s): GLUCAP in the last 168 hours.  Recent Labs Lab 03/31/15 1030 03/31/15 1043  NA 139 141  K 4.1 4.1  CL 108 107  CO2 22  --   GLUCOSE 103* 103*  BUN 27* 30*  CREATININE 1.27* 1.20  CALCIUM 8.8*  --     Recent Labs Lab 03/31/15 1030  AST 20  ALT 20  ALKPHOS 97  BILITOT 0.7  PROT 6.4*  ALBUMIN 3.6    Recent Labs Lab 03/31/15 1030 03/31/15 1043  WBC 6.3  --   NEUTROABS 2.5  --   HGB 13.4 14.3  HCT 40.1 42.0  MCV 93.7  --   PLT 233   --     Recent Labs Lab 03/31/15 1030  TROPONINI <0.03    Recent Labs  03/31/15 1030  LABPROT 14.4  INR 1.10    Recent Labs  03/31/15 1130  COLORURINE YELLOW  LABSPEC 1.021  PHURINE 6.0  GLUCOSEU NEGATIVE  HGBUR NEGATIVE  BILIRUBINUR NEGATIVE  KETONESUR NEGATIVE  PROTEINUR NEGATIVE  UROBILINOGEN 0.2  NITRITE NEGATIVE  LEUKOCYTESUR NEGATIVE       Component Value Date/Time   CHOL 147 04/01/2015 0540   TRIG 149 04/01/2015 0540   HDL 29* 04/01/2015 0540   CHOLHDL 5.1 04/01/2015 0540   VLDL 30 04/01/2015 0540   LDLCALC 88 04/01/2015 0540   Lab Results  Component Value Date   HGBA1C 6.1* 04/01/2015      Component Value Date/Time   LABOPIA NONE DETECTED 03/31/2015 1131   COCAINSCRNUR NONE DETECTED 03/31/2015 1131   LABBENZ NONE DETECTED 03/31/2015 1131   AMPHETMU NONE DETECTED 03/31/2015 1131   THCU NONE DETECTED 03/31/2015 1131   LABBARB NONE DETECTED 03/31/2015 1131     Recent Labs Lab 03/31/15 1030  ETH <5    Ct Head Wo Contrast 03/31/2015    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 No acute intracranial abnormality.     Mri & Mra Head/brain Wo  Cm 04/01/2015    1. 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  2. Otherwise stable diffuse atrophy and white matter disease compatible with chronic microvascular ischemia.  3. Stable fusiform dilation of the high cervical right internal carotid artery.  4. 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  5. Moderate diffuse small vessel disease.   2D Echocardiogram  - Left ventricle: The cavity size was normal. There was mild focalbasal hypertrophy of the septum. Systolic function was normal.The estimated ejection fraction was in the range of 55% to 60%.Wall motion was normal; there were no regional wall motionabnormalities. Left ventricular diastolic function  parameterswere normal. - Aortic valve: There was trivial regurgitation. - Mitral valve: Calcified annulus. - Atrial septum: A closure device in the fossa ovalis region waspresent. No residual shunting by color Doppler exam. No evidenceof thrombus.  Carotid Doppler  There is 1-39% bilateral ICA stenosis. Vertebral artery flow is antegrade.    Lower Extremity Dopplers negative   PHYSICAL EXAM  Temp:  [97.7 F (36.5 C)-98.3 F (36.8 C)] 97.7 F (36.5 C) (08/02 0936) Pulse Rate:  [57-64] 63 (08/02 0936) Resp:  [18-20] 20 (08/02 0936) BP: (117-162)/(62-87) 162/87 mmHg (08/02 0936) SpO2:  [96 %-100 %] 99 % (08/02 0516)  General - Well nourished, well developed, in no apparent distress.  Ophthalmologic - Sharp disc margins OU.  Cardiovascular - Regular rate and rhythm with no murmur.  Mental Status -  Level of arousal and orientation to time, place, and person were intact. 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 Fund of Knowledge was assessed and was intact.  Cranial Nerves II - XII - II - Visual field intact OU. III, IV, VI - Extraocular movements intact. V - Facial sensation intact bilaterally. VII - Facial movement intact bilaterally. VIII - Hearing & vestibular intact bilaterally. 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 XII - Tongue protrusion intact.  Motor Strength - The patient's strength was normal in all extremities and pronator drift was absent.  Bulk was normal and fasciculations were absent.   Motor Tone - Muscle tone was assessed at the neck and appendages and was normal.  Reflexes - The patient's reflexes were 1+ in all extremities and he had no pathological reflexes.  Sensory - Light touch, temperature/pinprick, and Romberg testing were  assessed and were symmetrical.    Coordination - The patient had normal movements in the hands and feet with no ataxia or dysmetria.  Tremor was absent.  Gait and Station - The patient's transfers, posture, gait, station, and turns were observed as normal.   ASSESSMENT/PLAN Aaron Whitaker is a 74 y.o. male with history of HTN, hyperlipidemia, CAD s/p CABG, MI, left frontal ischemic infarct in 2016 without residual deficits, s/p PFO closure in 2013 presenting with acute onset language difficulty.  He did not receive IV t-PA due to late presentation.   Stroke:  Dominant infarct probably embolic, secondary to unknown source. Patient had PFO closure in 2013 due to strokes and large PFO.   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  MRA  high-grade stenosis of the left vertebral artery  Carotid Doppler -  unremarkable   LE venous duplex - negative for deep vein thrombosis.   2D Echo No source of embolus   TCD with bubbles - spencer grade IV at rest, grade V with valsalva TEE to look for embolic source. Arranged for tomorrow.   If TEE negative, a [place] electrophysiologist will consult and consider placement of an implantable loop recorder to evaluate for atrial fibrillation as etiology of stroke. This has been explained to patient/family by Dr. Erlinda Hong and they are agreeable.   LDL 88  HgbA1c 6.0  Subcutaneous heparin for VTE prophylaxis Diet Heart Room service appropriate?: Yes; Fluid consistency:: Thin  clopidogrel 75 mg orally every day prior to admission, now on aspirin 81 mg orally every day and clopidogrel 75 mg orally every day Due to intracranial stenosis. Plan for dural antiplatelet for 3 months and then Plavix alone.   Patient counseled to be compliant with his antithrombotic medications  Ongoing aggressive stroke risk factor management  Therapy recommendations:   Home health physical therapy recommended. NO OT or ST  Disposition:  Home after TEE with HH PT  Hx of embolic strokes  Stroke in 2007 Preoperative period of CABG  Right MCA small embolic infarct [id number]  Punctate left frontal infarcts 08/2014  Left MCA small embolic infarct [id number]  PFO closure by Dr. Einar Gip in 2013  Followed by Dr. Lonni Fix in [place]  TCD with bubble study showed spencer grade 4 at rest, Grade 5 with Valsalva  LE venous doppler negative for DVT  TEE and possible loop recorder scheduled for tomorrow  Hypertension  Home meds:  Coreg 3.125 mg twice daily  Stable  Permissive hypertension <220/120 for 24-48 hours and then gradually normalize within 5-7 days  Patient counseled to be compliant with his blood pressure medications  Hyperlipidemia  Home meds:  Lipitor 40 mg daily resumed in hospital  LDL 88, goal < 70  Continue statin at discharge  Other Stroke Risk Factors  Advanced age  Cigarette smoker, quit smoking 49 years ago.  Hx stroke/TIA  Coronary artery disease  Other Active Problems  Low HDL  Other Pertinent History  Remote history of GI bleed - decrease aspirin to 81 mg daily along with Plavix.  Has seen Dr. Tish Frederickson for neurology in the past - follow-up after discharge. Order written  Hospital day # [place] for Pager information 04/02/2015 10:41 AM   I, the attending vascular neurologist, have personally obtained a history, examined the patient, evaluated laboratory data, individually viewed imaging studies and agree with radiology interpretations.  Together with the NP/PA, we formulated the assessment and plan of care which reflects our mutual decision.  I have made any additions or clarifications directly to the above note and agree with the findings and plan as currently documented.   74 year old male with history of HTN, HLD, CAD s/p CABG in 2007, GI bleeding, recurrent stroke with PFO  closure was admitted for left MCA small infarct. Patient had stroke in 2007 perioperative at time of CABG, right MCA small infarct 2012, and left frontal white matter punctate infarct 08/2014. Had a PFO closure in 2013. Repeat TEE showed spencer grade 4 at rest, grade 5 with Valsalva. Patient needed TEE and loop recorder to rule out cardial source of emboli.  Rosalin Hawking, MD PhD Stroke Neurology 04/02/2015 6:25 PM      To contact Stroke Continuity provider, please refer to [email or web address] After hours, contact  General Neurology

## 2015-04-04 ENCOUNTER — Encounter (HOSPITAL_COMMUNITY): Payer: Self-pay | Admitting: Cardiology

## 2015-04-11 ENCOUNTER — Ambulatory Visit (INDEPENDENT_AMBULATORY_CARE_PROVIDER_SITE_OTHER): Payer: Medicare Other | Admitting: *Deleted

## 2015-04-11 DIAGNOSIS — I6330 Cerebral infarction due to thrombosis of unspecified cerebral artery: Secondary | ICD-10-CM

## 2015-04-11 DIAGNOSIS — I639 Cerebral infarction, unspecified: Secondary | ICD-10-CM

## 2015-04-11 LAB — CUP PACEART INCLINIC DEVICE CHECK
Date Time Interrogation Session: 20160811130720
MDC IDC SET ZONE DETECTION INTERVAL: 2000 ms
Zone Setting Detection Interval: 3000 ms
Zone Setting Detection Interval: 380 ms

## 2015-04-11 NOTE — Progress Notes (Signed)
Wound check appt s/p ILR implant. Wound well healed without redness or edema.  Pt with 0 tachy episodes; 0 brady episodes;0 asystole; 0 symptom episodes; 0 AF episodes.   Plan to follow up via [facility] and with [facility] prn.

## 2015-04-19 DIAGNOSIS — H3531 Nonexudative age-related macular degeneration: Secondary | ICD-10-CM | POA: Diagnosis not present

## 2015-05-07 ENCOUNTER — Encounter: Payer: Self-pay | Admitting: Internal Medicine

## 2015-05-16 ENCOUNTER — Encounter (INDEPENDENT_AMBULATORY_CARE_PROVIDER_SITE_OTHER): Payer: Medicare Other | Admitting: Ophthalmology

## 2015-05-16 DIAGNOSIS — H43813 Vitreous degeneration, bilateral: Secondary | ICD-10-CM

## 2015-05-16 DIAGNOSIS — H3531 Nonexudative age-related macular degeneration: Secondary | ICD-10-CM

## 2015-05-16 DIAGNOSIS — I10 Essential (primary) hypertension: Secondary | ICD-10-CM

## 2015-05-16 DIAGNOSIS — H35033 Hypertensive retinopathy, bilateral: Secondary | ICD-10-CM | POA: Diagnosis not present

## 2015-06-03 ENCOUNTER — Ambulatory Visit (INDEPENDENT_AMBULATORY_CARE_PROVIDER_SITE_OTHER): Payer: Medicare Other | Admitting: *Deleted

## 2015-06-03 ENCOUNTER — Ambulatory Visit (INDEPENDENT_AMBULATORY_CARE_PROVIDER_SITE_OTHER): Payer: Medicare Other | Admitting: Diagnostic Neuroimaging

## 2015-06-03 ENCOUNTER — Encounter: Payer: Self-pay | Admitting: Diagnostic Neuroimaging

## 2015-06-03 VITALS — BP 158/80 | HR 55 | Wt 177.0 lb

## 2015-06-03 DIAGNOSIS — I6339 Cerebral infarction due to thrombosis of other cerebral artery: Secondary | ICD-10-CM

## 2015-06-03 DIAGNOSIS — I63312 Cerebral infarction due to thrombosis of left middle cerebral artery: Secondary | ICD-10-CM

## 2015-06-03 DIAGNOSIS — I639 Cerebral infarction, unspecified: Secondary | ICD-10-CM | POA: Diagnosis not present

## 2015-06-03 NOTE — Patient Instructions (Signed)
Thank you for coming to see [place] at [facility]. I hope we have been able to provide you high quality care today.  You may receive a patient satisfaction survey over the next few weeks. We would appreciate your feedback and comments so that we may continue to improve ourselves and the health of our patients.  - continue aspirin and plavix until end of October 2016. - then Jul 02, 2015, stop aspirin and continue plavix 71m daily - continue other medications   ~~~~~~~~~~~~~~~~~~~~~~~~~~~~~~~~~~~~~~~~~~~~~~~~~~~~~~~~~~~~~~~~~  DR. Kennady Zimmerle'S GUIDE TO HAPPY AND HEALTHY LIVING These are some of my general health and wellness recommendations. Some of them may apply to you better than others. Please use common sense as you try these suggestions and feel free to ask me any questions.   ACTIVITY/FITNESS Mental, social, emotional and physical stimulation are very important for brain and body health. Try learning a new activity (arts, music, language, sports, games).  Keep moving your body to the best of your abilities. You can do this at home, inside or outside, the park, community center, gym or anywhere you like. Consider a physical therapist or personal trainer to get started. Consider the app Sworkit. Fitness trackers such as smart-watches, smart-phones or Fitbits can help as well.   NUTRITION Eat more plants: colorful vegetables, nuts, seeds and berries.  Eat less sugar, salt, preservatives and processed foods.  Avoid toxins such as cigarettes and alcohol.  Drink water when you are thirsty. Warm water with a slice of lemon is an excellent morning drink to start the day.  Consider these websites for more information The Nutrition Source (hhttps://www.henry-hernandez.biz/ [employer] Nutrition (wWindowBlog.ch   RELAXATION Consider practicing mindfulness meditation or other relaxation techniques such as deep breathing, prayer,  yoga, tai chi, massage. See website mindful.org or the apps Headspace or Calm to help get started.   SLEEP Try to get at least 7-8+ hours sleep per day. Regular exercise and reduced caffeine will help you sleep better. Practice good sleep hygeine techniques. See website sleep.org for more information.   PLANNING Prepare estate planning, living will, healthcare POA documents. Sometimes this is best planned with the help of an attorney. Theconversationproject.org and agingwithdignity.org are excellent resources.

## 2015-06-03 NOTE — Progress Notes (Signed)
[facility]  PATIENT: Aaron Whitaker DOB: 05-12-1941  REFERRING CLINICIAN: Tobie Lords HISTORY FROM: patient and wife REASON FOR VISIT: follow up   HISTORICAL  CHIEF COMPLAINT:  Chief Complaint  Patient presents with  . Cerebrovascular Accident    rm 5, wife- Kathlee Nations  . Follow-up    hospital FU    HISTORY OF PRESENT ILLNESS:   UPDATE 06/03/15: Since last visit, had stroke in 03/31/15, with sudden speech difficulty, then found to have acute left frontal ischemic infarction. Switched to dual anti-platelet therapy x 3 months (will complete on 07/01/15), then planning for plavix 75mg  daily alone. Has heart monitor placed now.   UPDATE 11/13/14: Since last visit, has done well with PT. No more falls. Testing results reviewed. Overall doing well.  PRIOR HPI (09/14/14): 74 year old male with hypertension, hyperglycemia, coronary artery disease status post CABG, prostate cancer status post radiation therapy, stroke 2 affecting left leg and then left arm/hand strength, here for evaluation of gait and balance difficulty. Over past 10 years patient has had intermittent lower extremity pains from knees down to feet. Over time he has been having more problems with cramps in his calves especially if he exerts himself. Since July 2015 he has been having more balance problems. He has fallen down a time since then. Around Christmas 2015, patient was playing golf, having trouble walking normally on the golf course. When he would start walking, his steps would speed up and then he would lose control. Patient has fallen towards the left side as well as backwards. No new tremor or shaking. No sudden onset focal neurologic symptoms. No new unilateral symptoms.    REVIEW OF SYSTEMS: Full 14 system review of systems performed and notable only for loss of vision.   ALLERGIES: No Known Allergies  HOME MEDICATIONS: Outpatient Prescriptions Prior to Visit  Medication Sig Dispense Refill  .  aspirin 81 MG chewable tablet Chew 1 tablet (81 mg total) by mouth daily. 90 tablet 0  . atorvastatin (LIPITOR) 40 MG tablet Take 40 mg by mouth daily.    Marland Kitchen Bioflavonoid Products (ESTER C PO) Take 1,000 mg by mouth daily.    . carvedilol (COREG) 3.125 MG tablet Take 3.125 mg by mouth 2 (two) times daily with a meal.     . clopidogrel (PLAVIX) 75 MG tablet Take 1 tablet (75 mg total) by mouth daily. 90 tablet 4  . diphenhydrAMINE (BENADRYL) 25 MG tablet Take 25 mg by mouth daily as needed for allergies.     . fluticasone (FLONASE) 50 MCG/ACT nasal spray Place 2 sprays into both nostrils every morning.     . Multiple Vitamin (MULTIVITAMIN) capsule Take 1 capsule by mouth daily.    . Omega-3 Fatty Acids (FISH OIL) 1200 MG CAPS Take 1,200 mg by mouth daily.     No facility-administered medications prior to visit.    PAST MEDICAL HISTORY: Past Medical History  Diagnosis Date  . Hypertension   . Hyperlipidemia   . Ulcerative colitis   . Pernicious anemia   . Hyperthyroidism   . Hx of CABG   . MI (myocardial infarction) ([place]) 03/2006  . Blood transfusion   . Lower GI bleeding 1987    "bleeding ulcers; got 6 pints of blood"  . Arthritis   . Prostate cancer ([place]) ~2005    S/P radiation; seed implants  . Heart disease   . Skin cancer   . Stroke [facility]) 2007; 12/2010    "light"; denies residuals, July 31,2016 most  recent  . Macular degeneration     bilateral    PAST SURGICAL HISTORY: Past Surgical History  Procedure Laterality Date  . Patent foramen ovale closure  11/17/11  . Coronary artery bypass graft  02/2006    CABG X 5  . Gastric resection  ~ 1987    "had ~ 90% of my stomach removed"  . 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 Knee arthroscopy      bilaterally; "long long time ago" (11/17/11)  . Patent foramen ovale closure N/A 11/17/2011    Procedure: PATENT FORAMEN OVALE CLOSURE;  Surgeon: Laverda Page, MD;  Location: [facility] CATH LAB;  Service: Cardiovascular;  Laterality: N/A;    . Ep implantable device N/A 04/03/2015    Procedure: Loop Recorder Insertion;  Surgeon: Evans Lance, MD;  Location: [facility] CV LAB;  Service: Cardiovascular;  Laterality: N/A;  . Tee without cardioversion N/A 04/03/2015    Procedure: TRANSESOPHAGEAL ECHOCARDIOGRAM (TEE);  Surgeon: Lelon Perla, MD;  Location: [facility] ENDOSCOPY;  Service: Cardiovascular;  Laterality: N/A;    FAMILY HISTORY: Family History  Problem Relation Age of Onset  . Cancer Mother   . Other Father     house fire  . Alzheimer's disease Sister   . Prostate cancer Brother     SOCIAL HISTORY:  Social History   Social History  . Marital Status: Married    Spouse Name: Evon Talarico  . Number of Children: 0  . Years of Education: 9th   Occupational History  . retired Other   Social History Main Topics  . Smoking status: Former Smoker -- 0.50 packs/day for 8 years    Quit date: 08/31/1965  . Smokeless tobacco: Current User    Types: Chew     Comment: 2 pouches daily  . Alcohol Use: No  . Drug Use: No  . Sexual Activity: No   Other Topics Concern  . Not on file   Social History Narrative   Pt lives at home with spouse LIZ.   Retired.   9th grade.   Right handed.   Caffeine None      PHYSICAL EXAM  Filed Vitals:   06/03/15 1008  BP: 158/80  Pulse: 55  Weight: 177 lb (80.287 kg)    Body mass index is 26.13 kg/(m^2).  No exam data present  No flowsheet data found.  GENERAL EXAM: Patient is in no distress; well developed, nourished and groomed; neck is supple  CARDIOVASCULAR: Regular rate and rhythm, no murmurs, no carotid bruits  NEUROLOGIC: MENTAL STATUS: awake, alert, SLIGHTLY DECR FLUENCY; comprehension intact, naming intact, fund of knowledge appropriate; POSITIVE SNOUT; NEG MYERSONS CRANIAL NERVE: pupils equal and reactive to light, visual fields full to confrontation, extraocular muscles intact, no nystagmus, facial sensation symmetric, hearing intact, palate elevates  symmetrically, uvula midline, shoulder shrug symmetric, tongue midline; SLURRED SPEECH; NASAL SPEECH MOTOR: normal bulk and tone, full strength EXCEPT BLE (HF 4+, OTHERWISE 5); DECR FINGER TAP AND FOOT TAP ON LEFT SIDE SENSORY: DECR VIB IN HANDS; ABSENT VIB AT TOES. DECR PP IN FEET AND LEGS. DECR TEMP IN HANDS AND FEET COORDINATION: finger-nose-finger, fine finger movements normal REFLEXES: BUE 1, KNEES 1, ANKLES 0 GAIT/STATION: SLOW, CAREFUL GAIT; STOOP POSTURE; SLOW TURNS     DIAGNOSTIC DATA (LABS, IMAGING, TESTING) - I reviewed patient records, labs, notes, testing and imaging myself where available.  Lab Results  Component Value Date   WBC 7.4 04/03/2015   HGB 14.0 04/03/2015  HCT 41.4 04/03/2015   MCV 95.4 04/03/2015   PLT 223 04/03/2015      Component Value Date/Time   NA 141 03/31/2015 1043   NA 142 11/02/2011 0810   K 4.1 03/31/2015 1043   K 4.5 11/02/2011 0810   CL 107 03/31/2015 1043   CL 106 11/02/2011 0810   CO2 22 03/31/2015 1030   CO2 27 11/02/2011 0810   GLUCOSE 103* 03/31/2015 1043   GLUCOSE 101 11/02/2011 0810   BUN 30* 03/31/2015 1043   BUN 20 11/02/2011 0810   CREATININE 1.24 04/03/2015 1201   CREATININE 1.1 11/02/2011 0810   CALCIUM 8.8* 03/31/2015 1030   CALCIUM 8.5 11/02/2011 0810   PROT 6.4* 03/31/2015 1030   PROT 7.6 11/02/2011 0810   ALBUMIN 3.6 03/31/2015 1030   AST 20 03/31/2015 1030   AST 23 11/02/2011 0810   ALT 20 03/31/2015 1030   ALT 16 11/02/2011 0810   ALKPHOS 97 03/31/2015 1030   ALKPHOS 62 11/02/2011 0810   BILITOT 0.7 03/31/2015 1030   BILITOT 0.50 11/02/2011 0810   GFRNONAA 56* 04/03/2015 1201   GFRAA >60 04/03/2015 1201   Lab Results  Component Value Date   CHOL 147 04/01/2015   HDL 29* 04/01/2015   LDLCALC 88 04/01/2015   TRIG 149 04/01/2015   CHOLHDL 5.1 04/01/2015   Lab Results  Component Value Date   HGBA1C 6.1* 04/01/2015   Lab Results  Component Value Date   VITAMINB12 469 09/14/2014   Lab Results    Component Value Date   TSH 3.240 09/14/2014     01/17/11 MRI brain - Acute right hemisphere cortical infarct affecting the motor strip. Left hand weakness would be consistent with the observed location. Atrophy and small vessel disease.  01/17/11 MRA head - Widely patent carotid and basilar arteries. Right vertebral is the dominant contributor to the basilar. Left vertebral is severely diseased with a high-grade 75-90% stenosis distally. No proximal stenosis of the ACA, MCA, or PCA vessels. Mild irregularity distal MCA and PCA vessels suggests intracranial atherosclerotic disease.  IMPRESSION: Intracranial atherosclerotic change as described.  09/28/14 MRI brain (without) demonstrating: 1. Acute, punctate, left frontal periventricular ischemic infarction.  2. Moderate periventricular, subcortical and pontine chronic small vessel ischemic disease.  3. Compared to MRI on 01/17/11, there has been progression of chronic small vessel ischemic disease, and the punctate acute infarction is a new finding.  09/28/14 MRI lumbar spine (without) demonstrating: 1. At L5-S1: disc bulging and facet hypertrophy with mild-moderate right and severe left foraminal stenosis  2. At L4-5: disc bulging and facet hypertrophy with moderate biforaminal stenosis  3. At L2-3, L3-4: disc bulging and facet hypertrophy with mild biforaminal stenosis  4. Scoliosis convex to the left centered at L3.  10/18/14 CAROTID - no ICA stenosis; mild plaque in left bulb  10/18/14 TTE - Low normal LV function; grade 1 diastolic dysfunction; sclerotic aortic valve with trace AI; closure device noted on atrial septum with no residual shunt.  03/31/15 MRI brain 1. 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 2. Otherwise stable diffuse atrophy and white matter disease compatible with chronic microvascular ischemia.  03/31/15 MRA head  1. Stable fusiform dilation of the high cervical right  internal carotid artery. 2. 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 3. Moderate diffuse small vessel disease.     ASSESSMENT AND PLAN  74 y.o. year old male  here with progressive gait and balance difficulty, neurogenic claudication, peripheral neuropathy symptoms, history of strokes. Likely represents progressive cerebral small vessel ischemic disease with underlying lumbar radiculopathy/peripheral neuropathy processes.   Now with new left frontal ischemic infarction in July 2016, due to intracranial atherosclerosis. Now on dual antiplatelet for 3 months, then planning to go back to plavix alone.   PLAN: - continue dual antiplatelet therapy x 3 months course, then plavix 75mg  daily alone (starting Jul 02, 2015) - vascular risk factor reduction (BP, lipid, sugar control; statin, coreg).  - PT exercises at home - caution with uneven and wet surfaces  Return in about 1 year (around 06/02/2016).     Penni Bombard, MD [id number], [id number] AM Certified in Neurology, Neurophysiology and Neuroimaging  [facility] [address] [place], [place] [address] [phone]

## 2015-06-04 ENCOUNTER — Telehealth: Payer: Self-pay | Admitting: *Deleted

## 2015-06-04 NOTE — Telephone Encounter (Signed)
Error

## 2015-06-04 NOTE — Progress Notes (Signed)
Loop recorder 

## 2015-06-05 DIAGNOSIS — Z23 Encounter for immunization: Secondary | ICD-10-CM | POA: Diagnosis not present

## 2015-06-12 DIAGNOSIS — Z8709 Personal history of other diseases of the respiratory system: Secondary | ICD-10-CM | POA: Diagnosis not present

## 2015-06-12 DIAGNOSIS — I69359 Hemiplegia and hemiparesis following cerebral infarction affecting unspecified side: Secondary | ICD-10-CM | POA: Diagnosis not present

## 2015-06-12 DIAGNOSIS — Z6824 Body mass index (BMI) 24.0-24.9, adult: Secondary | ICD-10-CM | POA: Diagnosis not present

## 2015-06-12 DIAGNOSIS — I2510 Atherosclerotic heart disease of native coronary artery without angina pectoris: Secondary | ICD-10-CM | POA: Diagnosis not present

## 2015-06-12 DIAGNOSIS — Z79899 Other long term (current) drug therapy: Secondary | ICD-10-CM | POA: Diagnosis not present

## 2015-06-12 DIAGNOSIS — Z1389 Encounter for screening for other disorder: Secondary | ICD-10-CM | POA: Diagnosis not present

## 2015-06-12 DIAGNOSIS — Z9181 History of falling: Secondary | ICD-10-CM | POA: Diagnosis not present

## 2015-06-12 DIAGNOSIS — I639 Cerebral infarction, unspecified: Secondary | ICD-10-CM | POA: Diagnosis not present

## 2015-06-12 DIAGNOSIS — E7800 Pure hypercholesterolemia, unspecified: Secondary | ICD-10-CM | POA: Diagnosis not present

## 2015-06-24 LAB — CUP PACEART REMOTE DEVICE CHECK: Date Time Interrogation Session: 20161024150008

## 2015-06-24 NOTE — Progress Notes (Signed)
Carelink summary report received. Battery status OK. Normal device function. No new symptom episodes, tachy episodes, brady, or pause episodes. 1 AF episode, EGM suggests SR with trigeminal PVCs, +Plavix and ASA 81mg . Monthly summary reports and ROV with GT PRN.

## 2015-06-25 ENCOUNTER — Encounter: Payer: Self-pay | Admitting: Internal Medicine

## 2015-06-27 DIAGNOSIS — Z95818 Presence of other cardiac implants and grafts: Secondary | ICD-10-CM | POA: Diagnosis not present

## 2015-06-27 DIAGNOSIS — Z8673 Personal history of transient ischemic attack (TIA), and cerebral infarction without residual deficits: Secondary | ICD-10-CM | POA: Diagnosis not present

## 2015-06-27 DIAGNOSIS — E7800 Pure hypercholesterolemia, unspecified: Secondary | ICD-10-CM | POA: Diagnosis not present

## 2015-06-27 DIAGNOSIS — I2510 Atherosclerotic heart disease of native coronary artery without angina pectoris: Secondary | ICD-10-CM | POA: Diagnosis not present

## 2015-07-02 ENCOUNTER — Encounter: Payer: Self-pay | Admitting: Internal Medicine

## 2015-07-02 ENCOUNTER — Ambulatory Visit (INDEPENDENT_AMBULATORY_CARE_PROVIDER_SITE_OTHER): Payer: Medicare Other | Admitting: *Deleted

## 2015-07-02 DIAGNOSIS — I6339 Cerebral infarction due to thrombosis of other cerebral artery: Secondary | ICD-10-CM | POA: Diagnosis not present

## 2015-07-02 DIAGNOSIS — H353131 Nonexudative age-related macular degeneration, bilateral, early dry stage: Secondary | ICD-10-CM | POA: Diagnosis not present

## 2015-07-02 DIAGNOSIS — H25812 Combined forms of age-related cataract, left eye: Secondary | ICD-10-CM | POA: Diagnosis not present

## 2015-07-02 NOTE — Progress Notes (Signed)
Loop recorder 

## 2015-07-03 ENCOUNTER — Other Ambulatory Visit: Payer: Self-pay | Admitting: Cardiology

## 2015-07-03 DIAGNOSIS — K7681 Hepatopulmonary syndrome: Secondary | ICD-10-CM

## 2015-07-08 ENCOUNTER — Telehealth: Payer: Self-pay | Admitting: Diagnostic Neuroimaging

## 2015-07-08 NOTE — Telephone Encounter (Signed)
Called pt wife and discussed respect-esus study and stroke preventions strategies. She is interested. Will refer to research coordinator to follow up. -VRP

## 2015-07-11 ENCOUNTER — Telehealth: Payer: Self-pay | Admitting: *Deleted

## 2015-07-11 ENCOUNTER — Telehealth: Payer: Self-pay

## 2015-07-11 NOTE — Telephone Encounter (Signed)
I spoke to the patient's spouse, Parson Carson, and let her know that the patient does not meet the inclusion and exclusion criteria for the Respect-ESUS study. I thanked them for their time and consideration.

## 2015-07-11 NOTE — Telephone Encounter (Signed)
New  Problem    Research Dr has questions concerning pt's loop. Please call

## 2015-07-12 NOTE — Telephone Encounter (Signed)
Dr. Sanjuana Kava is requesting an EP review the ILR encounter report on 06/03/15. Dr. Sanjuana Kava is requesting Dr. Lovena Le either send him a message in Epic or fax a signed document confirming or denying pt was having SR w/ trigeminal PVCs, not AF.

## 2015-07-15 ENCOUNTER — Other Ambulatory Visit (HOSPITAL_COMMUNITY): Payer: Medicare Other

## 2015-07-16 ENCOUNTER — Telehealth: Payer: Self-pay | Admitting: Nurse Practitioner

## 2015-07-16 ENCOUNTER — Telehealth: Payer: Self-pay

## 2015-07-16 NOTE — Telephone Encounter (Signed)
Reviewed available Carelink data (Carelink has been transferred to Dr Einar Gip and so all I currently have access to are scanned device reports).  All of the information available on the scanned device reports do not demonstrate atrial fibrillation - the one strip that the device classified as AF was really SR with PVC's as previously noted.  Would recommend continued long term monitoring for atrial fibrillation, but none detected at this point.  Dr Einar Gip is now following Carelink transmissions remotely. We are available if needed going forward  Chanetta Marshall, NP 07/16/2015 3:48 PM

## 2015-07-16 NOTE — Telephone Encounter (Signed)
I spoke to the patient's spouse, Derrek Cavness, in regards to the Respect-ESUS study. Kathlee Nations would like to have more information about the study. I will email her the Informed Consent Form and will call her back by the end of the week to see if they are still interested in participating.

## 2015-07-17 ENCOUNTER — Telehealth: Payer: Self-pay

## 2015-07-17 NOTE — Telephone Encounter (Signed)
I spoke to the patient's spouse, Nim Gelwicks, in regards to the Respect-ESUS study. Kathlee Nations confirmed that they received the Informed Consent Form, but that they are still reading it. They would call me towards the end of the week to let me know if they are still interested in participating.

## 2015-07-19 ENCOUNTER — Telehealth: Payer: Self-pay

## 2015-07-19 NOTE — Telephone Encounter (Signed)
I spoke to the patient's spouse, Noe Glod, in regards to the Respect-ESUS study. The patient is not interested in participating. I thanked her for her time and consideration.

## 2015-07-30 DIAGNOSIS — Z7902 Long term (current) use of antithrombotics/antiplatelets: Secondary | ICD-10-CM | POA: Diagnosis not present

## 2015-07-30 DIAGNOSIS — Z79899 Other long term (current) drug therapy: Secondary | ICD-10-CM | POA: Diagnosis not present

## 2015-07-30 DIAGNOSIS — I10 Essential (primary) hypertension: Secondary | ICD-10-CM | POA: Diagnosis not present

## 2015-07-30 DIAGNOSIS — E785 Hyperlipidemia, unspecified: Secondary | ICD-10-CM | POA: Diagnosis not present

## 2015-07-30 DIAGNOSIS — Z8673 Personal history of transient ischemic attack (TIA), and cerebral infarction without residual deficits: Secondary | ICD-10-CM | POA: Diagnosis not present

## 2015-07-30 DIAGNOSIS — H9190 Unspecified hearing loss, unspecified ear: Secondary | ICD-10-CM | POA: Diagnosis not present

## 2015-07-30 DIAGNOSIS — Z87891 Personal history of nicotine dependence: Secondary | ICD-10-CM | POA: Diagnosis not present

## 2015-07-30 DIAGNOSIS — H25812 Combined forms of age-related cataract, left eye: Secondary | ICD-10-CM | POA: Diagnosis not present

## 2015-07-30 DIAGNOSIS — I252 Old myocardial infarction: Secondary | ICD-10-CM | POA: Diagnosis not present

## 2015-07-30 DIAGNOSIS — Z951 Presence of aortocoronary bypass graft: Secondary | ICD-10-CM | POA: Diagnosis not present

## 2015-08-01 ENCOUNTER — Encounter: Payer: Medicare Other | Admitting: *Deleted

## 2015-08-01 DIAGNOSIS — Z95818 Presence of other cardiac implants and grafts: Secondary | ICD-10-CM | POA: Diagnosis not present

## 2015-08-02 ENCOUNTER — Encounter (HOSPITAL_COMMUNITY): Admission: RE | Payer: Self-pay | Source: Ambulatory Visit

## 2015-08-02 ENCOUNTER — Ambulatory Visit (HOSPITAL_COMMUNITY): Admission: RE | Admit: 2015-08-02 | Payer: Medicare Other | Source: Ambulatory Visit | Admitting: Cardiology

## 2015-08-02 SURGERY — ASD/VSD CLOSURE
Anesthesia: LOCAL

## 2015-08-08 DIAGNOSIS — H2511 Age-related nuclear cataract, right eye: Secondary | ICD-10-CM | POA: Diagnosis not present

## 2015-08-08 DIAGNOSIS — H25811 Combined forms of age-related cataract, right eye: Secondary | ICD-10-CM | POA: Diagnosis not present

## 2015-08-29 DIAGNOSIS — Z95818 Presence of other cardiac implants and grafts: Secondary | ICD-10-CM | POA: Diagnosis not present

## 2015-09-04 DIAGNOSIS — Q211 Atrial septal defect: Secondary | ICD-10-CM | POA: Diagnosis not present

## 2015-09-04 DIAGNOSIS — I2510 Atherosclerotic heart disease of native coronary artery without angina pectoris: Secondary | ICD-10-CM | POA: Diagnosis not present

## 2015-09-04 DIAGNOSIS — E7800 Pure hypercholesterolemia, unspecified: Secondary | ICD-10-CM | POA: Diagnosis not present

## 2015-09-05 DIAGNOSIS — L570 Actinic keratosis: Secondary | ICD-10-CM | POA: Diagnosis not present

## 2015-09-05 DIAGNOSIS — L918 Other hypertrophic disorders of the skin: Secondary | ICD-10-CM | POA: Diagnosis not present

## 2015-09-05 DIAGNOSIS — L821 Other seborrheic keratosis: Secondary | ICD-10-CM | POA: Diagnosis not present

## 2015-09-08 NOTE — H&P (Signed)
Aaron Whitaker is an 75 y.o. male.   Chief Complaint: Recurrent stroke. HPI: [name] is a 75 year-old Caucasian male with history of hypertension, hyperlipidemia, and history of PFO closure in 2012 for secondary prevention of stroke,as well as CAD with history of MI/CABG (bypass x5) on 03/29/06 with LIMA to LAD, SVG to OM1 and OM 2, SVG to PDA and PLA during which he suffered a perioperative stroke. He presents today for 6 month follow up. He is accompanied by his wife at the bedside.  He has had right hemispheric article infarct in may of 2012, again presented with gait instability in January 2016, third episode was March 31, 2015 when he was admitted to the hospital with dysarthria and a cerebral right hemispheric infarct. He has a loop recorder in-situ due to recurrent stroke. TCD bubble study 04/01/2015 revealed high intensity signals for right to left cardiac shunting. Underwent TEE  revealed test will shunting across the interatrial septal defect repair. Aspirin was discontinued and he was continued on Plavix that was previously on by neurology. He has since completed physical therapy and denies any recurrence of symptoms.  Otherwise, he is doing very well. He denies any symptoms of chest pain, shortness of breath, PND, orthopnea, edema, or symptoms suggestive of claudication. He is tolerating all medications well without side effects or bleeding diathesis.  I have had a long discussion with Dr. Leta Baptist regarding the risk of recurrent stroke as he was already on anti platelet therapy and stroke felt to be embolic and also there is no documented A. Fib. Hence after consensus, it was felt best to proceed with ASD closure for tertiary prevention of stroke. No new symptoms over the past 6 weeks.   Past Medical History  Diagnosis Date  . Hypertension   . Hyperlipidemia   . Ulcerative colitis   . Pernicious anemia   . Hyperthyroidism   . Hx of CABG   . MI (myocardial infarction)  ([place]) 03/2006  . Blood transfusion   . Lower GI bleeding 1987    "bleeding ulcers; got 6 pints of blood"  . Arthritis   . Prostate cancer ([place]) ~2005    S/P radiation; seed implants  . Heart disease   . Skin cancer   . Stroke [facility]) 2007; 12/2010    "light"; denies residuals, July 31,2016 most recent  . Macular degeneration     bilateral    Past Surgical History  Procedure Laterality Date  . Patent foramen ovale closure  11/17/11  . Coronary artery bypass graft  02/2006    CABG X 5  . Gastric resection  ~ 1987    "had ~ 90% of my stomach removed"  . 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 Knee arthroscopy      bilaterally; "long long time ago" (11/17/11)  . Patent foramen ovale closure N/A 11/17/2011    Procedure: PATENT FORAMEN OVALE CLOSURE;  Surgeon: Laverda Page, MD;  Location: [facility] CATH LAB;  Service: Cardiovascular;  Laterality: N/A;  . Ep implantable device N/A 04/03/2015    Procedure: Loop Recorder Insertion;  Surgeon: Evans Lance, MD;  Location: [facility] CV LAB;  Service: Cardiovascular;  Laterality: N/A;  . Tee without cardioversion N/A 04/03/2015    Procedure: TRANSESOPHAGEAL ECHOCARDIOGRAM (TEE);  Surgeon: Lelon Perla, MD;  Location: [facility] ENDOSCOPY;  Service: Cardiovascular;  Laterality: N/A;    Family History  Problem Relation Age of Onset  . Cancer Mother   .  Other Father     house fire  . Alzheimer's disease Sister   . Prostate cancer Brother    Social History:  reports that he quit smoking about 50 years ago. His smokeless tobacco use includes Chew. He reports that he does not drink alcohol or use illicit drugs.  Allergies: No Known Allergies  No prescriptions prior to admission    Review of Systems  Constitutional: Negative for fever and chills.  HENT: Negative for hearing loss.   Eyes: Negative for blurred vision.  Respiratory: Negative for shortness of breath and wheezing.   Cardiovascular: Negative for chest pain, palpitations, orthopnea and  claudication.  Gastrointestinal: Negative for heartburn and blood in stool.  Musculoskeletal: Negative for myalgias.  Skin: Negative for rash.  Neurological: Negative for dizziness, focal weakness and seizures.   Blood pressure 131/80, pulse 71, temperature 97.9 F (36.6 C), temperature source Oral, resp. rate 16, weight 80 kg (176 lb 5.9 oz), SpO2 97 %.  Physical Exam  Constitutional: He is oriented to person, place, and time. He appears well-developed and well-nourished.  Neck: Normal range of motion. Neck supple.  Cardiovascular: Normal rate, regular rhythm and normal heart sounds.  Exam reveals no gallop and no friction rub.   No murmur heard. Respiratory: Effort normal and breath sounds normal. He has no wheezes.  GI: Soft. Bowel sounds are normal.  Musculoskeletal: Normal range of motion.  Neurological: He is alert and oriented to person, place, and time.  Skin: Skin is warm and dry.     Assessment/Plan 1. History of CVA (cerebrovascular accident) (Z86.73) Stroke right hemispheric cortical infarct with left hand clumsyness on 01/16/2011 and again on 09/28/2014 and third CVA on 03/31/2015 with dysarthria and right cerebral infarct. 2. 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 TCD bubble study 04/01/2015 revealed high intensity signals for right to left cardiac shunting.  TEE 04/03/2015: PFO closure device noted; positive salinemicrocavitation study consistent with residual shunt. 3. CAD: MI/CABG (bypass x5) 03/29/06: LIMA to LAD, SVG to OM1 and OM 2, SVG to PDA and PLA [name], MD. Had perioperative stroke in 2007.  Recommendation: Proceed with ASD closure. Discussed risks, benefits and alternatives of angiogram including but not limited to <1% risk of death, stroke, MI, need for urgent surgical intervention for device embolization, pericardial effusion, but not limited to thest. patient is willing to proceed.  [name] 09/08/2015, 11:39  AM

## 2015-09-10 ENCOUNTER — Encounter (HOSPITAL_COMMUNITY)
Admission: RE | Disposition: A | Discharge status: Home / Self Care | Payer: Self-pay | Source: Ambulatory Visit | Attending: Cardiology

## 2015-09-10 ENCOUNTER — Ambulatory Visit (HOSPITAL_COMMUNITY): Payer: Medicare Other

## 2015-09-10 ENCOUNTER — Ambulatory Visit (HOSPITAL_COMMUNITY)
Admission: RE | Admit: 2015-09-10 | Discharge: 2015-09-11 | Disposition: A | Discharge status: Home / Self Care | Payer: Medicare Other | Source: Ambulatory Visit | Attending: Cardiology | Admitting: Cardiology

## 2015-09-10 DIAGNOSIS — I10 Essential (primary) hypertension: Secondary | ICD-10-CM | POA: Diagnosis not present

## 2015-09-10 DIAGNOSIS — I2510 Atherosclerotic heart disease of native coronary artery without angina pectoris: Secondary | ICD-10-CM | POA: Insufficient documentation

## 2015-09-10 DIAGNOSIS — F17220 Nicotine dependence, chewing tobacco, uncomplicated: Secondary | ICD-10-CM | POA: Diagnosis not present

## 2015-09-10 DIAGNOSIS — H3530 Unspecified macular degeneration: Secondary | ICD-10-CM | POA: Diagnosis not present

## 2015-09-10 DIAGNOSIS — Z8546 Personal history of malignant neoplasm of prostate: Secondary | ICD-10-CM | POA: Insufficient documentation

## 2015-09-10 DIAGNOSIS — Q211 Atrial septal defect, unspecified: Secondary | ICD-10-CM

## 2015-09-10 DIAGNOSIS — D510 Vitamin B12 deficiency anemia due to intrinsic factor deficiency: Secondary | ICD-10-CM | POA: Diagnosis not present

## 2015-09-10 DIAGNOSIS — M1990 Unspecified osteoarthritis, unspecified site: Secondary | ICD-10-CM | POA: Diagnosis not present

## 2015-09-10 DIAGNOSIS — E785 Hyperlipidemia, unspecified: Secondary | ICD-10-CM | POA: Diagnosis not present

## 2015-09-10 DIAGNOSIS — Z7902 Long term (current) use of antithrombotics/antiplatelets: Secondary | ICD-10-CM | POA: Diagnosis not present

## 2015-09-10 DIAGNOSIS — Z951 Presence of aortocoronary bypass graft: Secondary | ICD-10-CM | POA: Insufficient documentation

## 2015-09-10 DIAGNOSIS — Z85828 Personal history of other malignant neoplasm of skin: Secondary | ICD-10-CM | POA: Insufficient documentation

## 2015-09-10 DIAGNOSIS — E0590 Thyrotoxicosis, unspecified without thyrotoxic crisis or storm: Secondary | ICD-10-CM | POA: Insufficient documentation

## 2015-09-10 DIAGNOSIS — Q2112 Patent foramen ovale: Secondary | ICD-10-CM

## 2015-09-10 DIAGNOSIS — Z8673 Personal history of transient ischemic attack (TIA), and cerebral infarction without residual deficits: Secondary | ICD-10-CM | POA: Diagnosis not present

## 2015-09-10 DIAGNOSIS — K5190 Ulcerative colitis, unspecified, without complications: Secondary | ICD-10-CM | POA: Diagnosis not present

## 2015-09-10 DIAGNOSIS — I252 Old myocardial infarction: Secondary | ICD-10-CM | POA: Diagnosis not present

## 2015-09-10 HISTORY — PX: CARDIAC CATHETERIZATION: SHX172

## 2015-09-10 LAB — POCT ACTIVATED CLOTTING TIME
ACTIVATED CLOTTING TIME: 235 s
ACTIVATED CLOTTING TIME: 255 s
ACTIVATED CLOTTING TIME: 281 s
ACTIVATED CLOTTING TIME: 275 s
ACTIVATED CLOTTING TIME: 235 s
ACTIVATED CLOTTING TIME: 199 s
Activated Clotting Time: 214 seconds
Activated Clotting Time: 183 seconds

## 2015-09-10 LAB — POCT I-STAT 3, VENOUS BLOOD GAS (G3P V)
Bicarbonate: 25.7 mEq/L — ABNORMAL HIGH (ref 20.0–24.0)
O2 Saturation: 75 %
PCO2 VEN: 44.9 mmHg — AB (ref 45.0–50.0)
PH VEN: 7.366 — AB (ref 7.250–7.300)
TCO2: 27 mmol/L (ref 0–100)
pO2, Ven: 42 mmHg (ref 30.0–45.0)

## 2015-09-10 SURGERY — ASD/VSD CLOSURE
Anesthesia: LOCAL

## 2015-09-10 MED ORDER — LIDOCAINE HCL (PF) 1 % IJ SOLN
INTRAMUSCULAR | Status: DC | PRN
  Administered 2015-09-10: 12:00:00

## 2015-09-10 MED ORDER — DIPHENHYDRAMINE HCL 25 MG PO CAPS
25.0000 mg | ORAL_CAPSULE | Freq: Every day | ORAL | Status: DC | PRN
  Filled 2015-09-10: qty 1

## 2015-09-10 MED ORDER — SODIUM CHLORIDE 0.9 % WEIGHT BASED INFUSION: 1.0000 mL/kg/h | INTRAVENOUS | Status: DC

## 2015-09-10 MED ORDER — ASPIRIN 81 MG PO CHEW
81.0000 mg | CHEWABLE_TABLET | ORAL | Status: AC
  Administered 2015-09-10: 81 mg via ORAL

## 2015-09-10 MED ORDER — HEPARIN (PORCINE) IN NACL 2-0.9 UNIT/ML-% IJ SOLN
INTRAMUSCULAR | Status: AC
  Filled 2015-09-10: qty 500

## 2015-09-10 MED ORDER — SODIUM CHLORIDE 0.9 % IJ SOLN: 3.0000 mL | INTRAMUSCULAR | Status: DC | PRN

## 2015-09-10 MED ORDER — FENTANYL CITRATE (PF) 100 MCG/2ML IJ SOLN
INTRAMUSCULAR | Status: AC
  Filled 2015-09-10: qty 2

## 2015-09-10 MED ORDER — ACETAMINOPHEN 325 MG PO TABS: 650.0000 mg | ORAL_TABLET | ORAL | Status: DC | PRN

## 2015-09-10 MED ORDER — CEFAZOLIN SODIUM-DEXTROSE 2-3 GM-% IV SOLR
INTRAVENOUS | Status: DC | PRN
Start: 2015-09-10 — End: 2015-09-10
  Administered 2015-09-10: 2 g via INTRAVENOUS

## 2015-09-10 MED ORDER — SODIUM CHLORIDE 0.9 % IJ SOLN
3.0000 mL | Freq: Two times a day (BID) | INTRAMUSCULAR | Status: DC
Start: 2015-09-10 — End: 2015-09-11
  Administered 2015-09-10: 3 mL via INTRAVENOUS

## 2015-09-10 MED ORDER — ASPIRIN 81 MG PO CHEW
CHEWABLE_TABLET | ORAL | Status: AC
  Filled 2015-09-10: qty 1

## 2015-09-10 MED ORDER — MIDAZOLAM HCL 2 MG/2ML IJ SOLN
INTRAMUSCULAR | Status: AC
  Filled 2015-09-10: qty 2

## 2015-09-10 MED ORDER — CLOPIDOGREL BISULFATE 300 MG PO TABS
ORAL_TABLET | ORAL | Status: AC
  Filled 2015-09-10: qty 1

## 2015-09-10 MED ORDER — ONDANSETRON HCL 4 MG/2ML IJ SOLN: 4.0000 mg | Freq: Four times a day (QID) | INTRAMUSCULAR | Status: DC | PRN

## 2015-09-10 MED ORDER — CLOPIDOGREL BISULFATE 300 MG PO TABS
ORAL_TABLET | ORAL | Status: AC
  Filled 2015-09-10: qty 2

## 2015-09-10 MED ORDER — LIDOCAINE HCL (PF) 1 % IJ SOLN
INTRAMUSCULAR | Status: AC
  Filled 2015-09-10: qty 30

## 2015-09-10 MED ORDER — CLOPIDOGREL BISULFATE 75 MG PO TABS
75.0000 mg | ORAL_TABLET | Freq: Every day | ORAL | Status: DC
  Administered 2015-09-11: 11:00:00 75 mg via ORAL
  Filled 2015-09-10: qty 1

## 2015-09-10 MED ORDER — SODIUM CHLORIDE 0.9 % WEIGHT BASED INFUSION
3.0000 mL/kg/h | INTRAVENOUS | Status: DC
  Administered 2015-09-10: 3 mL/kg/h via INTRAVENOUS

## 2015-09-10 MED ORDER — ASPIRIN 81 MG PO CHEW
81.0000 mg | CHEWABLE_TABLET | Freq: Every day | ORAL | Status: DC
  Administered 2015-09-11: 81 mg via ORAL
  Filled 2015-09-10: qty 1

## 2015-09-10 MED ORDER — SODIUM CHLORIDE 0.9 % IJ SOLN
3.0000 mL | Freq: Two times a day (BID) | INTRAMUSCULAR | Status: DC
Start: 2015-09-10 — End: 2015-09-10

## 2015-09-10 MED ORDER — SODIUM CHLORIDE 0.9 % IV SOLN: INTRAVENOUS | Status: AC

## 2015-09-10 MED ORDER — HEPARIN SODIUM (PORCINE) 1000 UNIT/ML IJ SOLN
INTRAMUSCULAR | Status: AC
  Filled 2015-09-10: qty 1

## 2015-09-10 MED ORDER — FLUTICASONE PROPIONATE 50 MCG/ACT NA SUSP
2.0000 | Freq: Two times a day (BID) | NASAL | Status: DC
Start: 2015-09-10 — End: 2015-09-11
  Administered 2015-09-10: 2 via NASAL
  Filled 2015-09-10: qty 16

## 2015-09-10 MED ORDER — CARVEDILOL 3.125 MG PO TABS
3.1250 mg | ORAL_TABLET | Freq: Two times a day (BID) | ORAL | Status: DC
  Administered 2015-09-10 – 2015-09-11 (×2): 3.125 mg via ORAL
  Filled 2015-09-10 (×2): qty 1

## 2015-09-10 MED ORDER — CEFAZOLIN SODIUM-DEXTROSE 2-3 GM-% IV SOLR
INTRAVENOUS | Status: AC
  Filled 2015-09-10: qty 50

## 2015-09-10 MED ORDER — HEPARIN SODIUM (PORCINE) 1000 UNIT/ML IJ SOLN
INTRAMUSCULAR | Status: DC | PRN
  Administered 2015-09-10: 3000 [IU] via INTRAVENOUS
  Administered 2015-09-10: 7000 [IU] via INTRAVENOUS
  Administered 2015-09-10: 3000 [IU] via INTRAVENOUS
  Administered 2015-09-10 (×3): 2000 [IU] via INTRAVENOUS

## 2015-09-10 MED ORDER — SODIUM CHLORIDE 0.9 % IV SOLN: 250.0000 mL | INTRAVENOUS | Status: DC | PRN

## 2015-09-10 MED ORDER — FENTANYL CITRATE (PF) 100 MCG/2ML IJ SOLN
INTRAMUSCULAR | Status: DC | PRN
  Administered 2015-09-10 (×2): 25 ug via INTRAVENOUS

## 2015-09-10 MED ORDER — CLOPIDOGREL BISULFATE 75 MG PO TABS
600.0000 mg | ORAL_TABLET | ORAL | Status: AC
  Administered 2015-09-10: 525 mg via ORAL

## 2015-09-10 MED ORDER — CLOPIDOGREL BISULFATE 75 MG PO TABS
ORAL_TABLET | ORAL | Status: AC
  Filled 2015-09-10: qty 3

## 2015-09-10 MED ORDER — MIDAZOLAM HCL 2 MG/2ML IJ SOLN
INTRAMUSCULAR | Status: DC | PRN
  Administered 2015-09-10: 2 mg via INTRAVENOUS
  Administered 2015-09-10: 1 mg via INTRAVENOUS

## 2015-09-10 SURGICAL SUPPLY — 17 items
AMPLATZER PFO OCCLUDER 35 (Prosthesis & Implant Heart) ×2 IMPLANT
CATH ACUNAV 8FR 90CM (CATHETERS) ×1 IMPLANT
CATH ANGIO 5F BER2 100CM (CATHETERS) ×1 IMPLANT
CATH SITESEER 5F MULTI A 2 (CATHETERS) ×1 IMPLANT
COVER SWIFTLINK CONNECTOR (BAG) ×1 IMPLANT
DEVICE TORQUE .025-.038 (MISCELLANEOUS) ×1 IMPLANT
GUIDEWIRE AMPLATZER 1.5JX260 (WIRE) ×1 IMPLANT
GUIDEWIRE ANGLED .035X260CM (WIRE) ×1 IMPLANT
OCCLUDER AMPLATZER PFO 35 (Prosthesis & Implant Heart) IMPLANT
PACK CARDIAC CATHETERIZATION (CUSTOM PROCEDURE TRAY) ×1 IMPLANT
PROTECTION STATION PRESSURIZED (MISCELLANEOUS) ×2
SET INTRODUCER MICROPUNCT 5F (INTRODUCER) ×1 IMPLANT
SHEATH PINNACLE 8F 10CM (SHEATH) ×1 IMPLANT
SHEATH PINNACLE 9F 10CM (SHEATH) ×2 IMPLANT
STATION PROTECTION PRESSURIZED (MISCELLANEOUS) IMPLANT
SYSTEM DELIVERY AMPLATZER 9FR (SHEATH) ×1 IMPLANT
WIRE HI TORQ WHISPER MS 300CM (WIRE) ×1 IMPLANT

## 2015-09-10 NOTE — Interval H&P Note (Signed)
History and Physical Interval Note:  09/10/2015 9:15 AM  Aaron Whitaker  has presented today for surgery, with the diagnosis of pfo  The various methods of treatment have been discussed with the patient and family. After consideration of risks, benefits and other options for treatment, the patient has consented to  Procedure(s): ASD/VSD Closure (N/A) as a surgical intervention .  The patient's history has been reviewed, patient examined, no change in status, stable for surgery.  I have reviewed the patient's chart and labs.  Questions were answered to the patient's satisfaction.     [name]

## 2015-09-10 NOTE — Progress Notes (Signed)
  Echocardiogram 2D Echocardiogram has been performed.  Johny Chess 09/10/2015, 11:48 AM

## 2015-09-10 NOTE — Progress Notes (Signed)
67fr venous sheaths X 2  Aspirated and removed from right femoral vein. Manual pressure applied for 20 minutes. Groin level 1 , slight hematom present 3 inches in diameter around insertion site. Tegaderm dressing applied, bedrest instructions given.  Bedrest begins at 15:30:00

## 2015-09-11 ENCOUNTER — Ambulatory Visit (HOSPITAL_COMMUNITY): Payer: Medicare Other

## 2015-09-11 ENCOUNTER — Encounter (HOSPITAL_COMMUNITY): Payer: Self-pay | Admitting: Cardiology

## 2015-09-11 DIAGNOSIS — Z8673 Personal history of transient ischemic attack (TIA), and cerebral infarction without residual deficits: Secondary | ICD-10-CM | POA: Diagnosis not present

## 2015-09-11 DIAGNOSIS — I2510 Atherosclerotic heart disease of native coronary artery without angina pectoris: Secondary | ICD-10-CM | POA: Diagnosis not present

## 2015-09-11 DIAGNOSIS — Q211 Atrial septal defect: Secondary | ICD-10-CM | POA: Diagnosis not present

## 2015-09-11 DIAGNOSIS — I252 Old myocardial infarction: Secondary | ICD-10-CM | POA: Diagnosis not present

## 2015-09-11 DIAGNOSIS — I10 Essential (primary) hypertension: Secondary | ICD-10-CM | POA: Diagnosis not present

## 2015-09-11 DIAGNOSIS — E785 Hyperlipidemia, unspecified: Secondary | ICD-10-CM | POA: Diagnosis not present

## 2015-09-11 MED ORDER — ASPIRIN 81 MG PO CHEW: 81.0000 mg | CHEWABLE_TABLET | Freq: Every day | ORAL | Status: DC

## 2015-09-11 NOTE — Progress Notes (Signed)
  Echocardiogram 2D Echocardiogram has been performed.  Aaron Whitaker 09/11/2015, 10:18 AM

## 2015-09-11 NOTE — Discharge Summary (Signed)
Physician Discharge Summary  Patient ID: Aaron Whitaker MRN: [id number] DOB/AGE: 1941/03/17 75 y.o.  Admit date: 09/10/2015 Discharge date: 09/11/2015  Primary Discharge Diagnosis: ASD s/p closure on 09/10/2015  Secondary Discharge Diagnosis: 1. H/O CVA 2. CAD of native coronary artery of native heart without angina 3. S/P CABG in 2007 4. Benign essential hypertension 5. 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 There is not an atrial septal aneurysm present. Agitated saline study indicates right-to-left shunt at rest and right-to-left shunt with Valsalva. Balloon sizing was not performed. Intracardiac echo used for procedural guidance. 30 mm Amplatzer PFO occluder was used to close the defect. Agitated saline study indicates no right-to-left shunt post closure. There were no immediate procedural complications.  Echocardiogram 09/10/2015: Left ventricle: The cavity size was normal. Systolic function was normal. The estimated ejection fraction was in the range of 50%to 55%. The study is not technically sufficient to allowevaluation of LV diastolic function. Ventricular septum: The outflow septum had a sigmoid appearance. Aortic valve: There was trivial regurgitation. Right ventricle: The cavity size was mildly dilated. Wallthickness was normal. Atrial septum: The atrial septal occlude appears to be in goodposition and the right atrial disk is slightly splayed thanusual. LA disk appears to be well placed. No residual shunting bycolor Doppler exam. Pericardium, extracardiac: There was no pericardial effusion.  Echocardiogram 09/11/2015:  Left ventricle: The cavity size was normal. There was mild focalbasal hypertrophy of the septum. Systolic function was normal.The estimated ejection fraction was in the range of 50% to 55%.Regional wall motion abnormalities cannot be excluded. The studyis not technically sufficient to  allow evaluation of LV diastolicfunction. Aortic valve: There was mild regurgitation. Right ventricle: The cavity size was mildly dilated. 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 Noresidual shunting by color Doppler evaluation. The occluderappears to be in good position. the right atrial disk appearsslightly splayed.  Hospital Course: [name] is a 75 year-old Caucasian male with history of hypertension, hyperlipidemia, and history of PFO closure in 2012 for secondary prevention of stroke, as well as CAD with history of MI/CABG (bypass x5) on 03/29/06 with LIMA to LAD, SVG to OM1 and OM 2, SVG to PDA and PLA during which he suffered a perioperative stroke.   He has had 2 hospitalization, first in January 2016 and then again on March 31, 2015, for recurrent CVA. He has a loop recorder in-situ. TCD bubble study on 04/01/2015 revealed high intensity signals for right to left cardiac shunting. He underwent TEEwhich revealed residual shunting across the interatrial septal defect repair. Aspirin was discontinued and he was continued on Plavix that he was previously on by neurology. After discussion with Dr. Leta Baptist regarding the risk of recurrent stroke despite anti platelet therapy as stroke was felt to be embolic without documented A. Fib, it was felt best to proceed with ASD closure for tertiary prevention of stroke. No new symptoms over the past 6 weeks.  On 09/10/2015, he underwent successful closure of the residual "tunnel ASD defect" that had formed through the Helex septal occluder placed in 2012. A 30 mm Amplatzer PFO septal occluder was deployed, with successful repair of the residual ASD. Double contrast study revealed no residual shunting.   Recommendations on discharge: Follow up as scheduled in the office next week. For now, he will continue with aspirin 81 mg along with Plavix for at least 3-6 months and continue Plavix indefinitely.   Discharge Exam: Blood  pressure 125/62,  pulse 78, temperature 98.5 F (36.9 C), temperature source Oral, resp. rate 13, weight 77.5 kg (170 lb 13.7 oz), SpO2 93 %.    General appearance: alert, cooperative and no distress Resp: clear to auscultation bilaterally Chest wall: no tenderness Cardio: regular rate and rhythm, S1, S2 normal, no murmur, click, rub or gallop and frequent ectopy Extremities: extremities normal, atraumatic, no cyanosis or edema Pulses: 2+ and symmetric Skin: Skin color, texture, turgor normal. No rashes or lesions  Right femoral site ecchymosis, only mild tenderness with palpation, not swelling or bruit  Labs:   Lab Results  Component Value Date   WBC 7.4 04/03/2015   HGB 14.0 04/03/2015   HCT 41.4 04/03/2015   MCV 95.4 04/03/2015   PLT 223 04/03/2015   No results for input(s): NA, K, CL, CO2, BUN, CREATININE, CALCIUM, PROT, BILITOT, ALKPHOS, ALT, AST, GLUCOSE in the last 168 hours.  Invalid input(s): LABALBU  Lipid Panel     Component Value Date/Time   CHOL 147 04/01/2015 0540   TRIG 149 04/01/2015 0540   HDL 29* 04/01/2015 0540   CHOLHDL 5.1 04/01/2015 0540   VLDL 30 04/01/2015 0540   LDLCALC 88 04/01/2015 0540    BNP (last 3 results) No results for input(s): BNP in the last 8760 hours.  HEMOGLOBIN A1C Lab Results  Component Value Date   HGBA1C 6.1* 04/01/2015   MPG 128 04/01/2015    Cardiac Panel (last 3 results)  Recent Labs  03/31/15 1030  TROPONINI <0.03    Lab Results  Component Value Date   CKTOTAL 129 01/17/2011   CKMB 3.0 01/17/2011   TROPONINI <0.03 03/31/2015     TSH  Recent Labs  09/14/14 1008  TSH 3.240    Radiology: No results found.   FOLLOW UP PLANS AND APPOINTMENTS    Medication List    TAKE these medications        aspirin 81 MG chewable tablet  Chew 1 tablet (81 mg total) by mouth daily.     carvedilol 3.125 MG tablet  Commonly known as:  COREG  Take 3.125 mg by mouth 2 (two) times daily with a meal.      clopidogrel 75 MG tablet  Commonly known as:  PLAVIX  Take 1 tablet (75 mg total) by mouth daily.     diphenhydrAMINE 25 MG tablet  Commonly known as:  BENADRYL  Take 25 mg by mouth daily as needed for allergies.     ESTER C PO  Take 1,000 mg by mouth daily.     Fish Oil 1200 MG Caps  Take 1,200 mg by mouth daily.     fluticasone 50 MCG/ACT nasal spray  Commonly known as:  FLONASE  Place 2 sprays into both nostrils 2 (two) times daily.     multivitamin capsule  Take 1 capsule by mouth daily.     PRESERVISION AREDS 2 PO  Take by mouth 2 (two) times daily before a meal.           Follow-up Information    Follow up with [name], MD On 09/18/2015.   Specialty:  Cardiology   Why:  at 11:15am   Contact information:   [place] [place] [address] [phone]        Rachel Bo, NP-C 09/11/2015, 8:18 AM [facility] Pager: [phone] Office: [phone]

## 2015-09-11 NOTE — Discharge Instructions (Signed)
Atrial Septal Defect, Adult An atrial septal defect (ASD) is a hole in the heart. This hole is located in the thin tissue (septum) that separates the two upper chambers of the heart, the right and left atrium. This hole is present at birth (congenital). A few minutes after birth, this hole normally closes so that blood is not able to go between the right and left atrium.  Normally, blood from the right side of the heart is pumped to the lungs where the blood is oxygenated. The oxygenated blood from the lungs is then pumped to the left side of the heart. From the left side of the heart, blood is pumped out to the rest of the body. When an ASD occurs, blood from the left atrium mixes with blood in the right atrium. The blood is then recirculated to the lungs and left side of the heart. In other words, the blood makes the trip twice. An ASD makes the heart work harder by increasing the amount of blood in the right side of the heart. This causes heart overload and eventually weakens the heart's ability to pump.  CAUSES  The cause of ASD is not known. SIGNS AND SYMPTOMS  The symptoms of ASD vary depending upon the size of the hole and the amount of blood that goes into the right atrium. There may be no symptoms or symptoms may include:  Tiredness or fatigue.  Trouble breathing or shortness of breath.  Irregular heartbeats (arrhythmias).  An extra "swishing" or "whooshing" type sound (heart murmur) heard when listening to the heart. DIAGNOSIS  In order to diagnose ASD, tests will need to be performed. Some of the tests may include:  ECG. This records the electrical activity of your heart and traces the patterns of your heartbeat.  Chest X-ray exam.  MRI or CT scan.  Nuclear medicine blood flow study. This imaging test shows how much blood is being passed through the ASD.  Echocardiography. There are two types that may be used:  Transthoracic echocardiography (TTE). A TTE is very sensitive for  detecting the two most common types of ASD, ostium primum or ostium secundum. It is not as sensitive in detecting a less common form of ASD, sinus venosus.  Transesophageal echocardiography (TEE). A TEE is especially helpful in patients who have a thin or easily movable (mobile) septum, making ASD detection more accurate.  Cardiac catheterization. In this procedure, a small tube (catheter) is passed through a large vein in your neck, groin, or arm. With this test, your health care provider can visualize your heart defect, check how well your heart is pumping, and check the function of your heart valves. TREATMENT   No treatment may be required if only a small amount of blood is moving back and forth (shunting) from the left to right atrium.  Minimally invasive closure may be done depending on the type and location of the ASD. Similar to the cardiac catheterization used to diagnose an ASD, this type of procedure is done in a cardiac catheterization lab. A catheter is inserted into a large blood vessel. The catheter is advanced to the ASD in the heart. A patch resembling an umbrella is threaded up the catheter and placed in the ASD hole. The patch is then "opened up" to close off the hole.  Open heart surgery may be necessary if minimally invasive closure cannot be done. If the ASD is small, the hole can be closed with stitches. If the ASD is large, a patch is  sewn over the defect so the hole is closed. SEEK IMMEDIATE MEDICAL CARE IF:  You experience unusual fatigue when exerting yourself.  You have chest pain at rest or with exertion.  You notice your fingertips or lips turning pale or blue. MAKE SURE YOU:   Understand these instructions.   Will watch your condition.  Will get help right away if you are not doing well or get worse.    This information is not intended to replace advice given to you by your health care provider. Make sure you discuss any questions you have with your health  care provider.   Document Released: 03/31/2004 Document Revised: 09/07/2014 Document Reviewed: 04/24/2013 Elsevier Interactive Patient Education [employer].

## 2015-09-18 DIAGNOSIS — Z8673 Personal history of transient ischemic attack (TIA), and cerebral infarction without residual deficits: Secondary | ICD-10-CM | POA: Diagnosis not present

## 2015-09-18 DIAGNOSIS — Z95818 Presence of other cardiac implants and grafts: Secondary | ICD-10-CM | POA: Diagnosis not present

## 2015-09-18 DIAGNOSIS — I2510 Atherosclerotic heart disease of native coronary artery without angina pectoris: Secondary | ICD-10-CM | POA: Diagnosis not present

## 2015-09-18 DIAGNOSIS — E7800 Pure hypercholesterolemia, unspecified: Secondary | ICD-10-CM | POA: Diagnosis not present

## 2015-09-30 DIAGNOSIS — Z95818 Presence of other cardiac implants and grafts: Secondary | ICD-10-CM | POA: Diagnosis not present

## 2015-10-01 ENCOUNTER — Other Ambulatory Visit: Payer: Self-pay | Admitting: Diagnostic Neuroimaging

## 2015-10-17 DIAGNOSIS — Z6825 Body mass index (BMI) 25.0-25.9, adult: Secondary | ICD-10-CM | POA: Diagnosis not present

## 2015-10-17 DIAGNOSIS — J019 Acute sinusitis, unspecified: Secondary | ICD-10-CM | POA: Diagnosis not present

## 2015-10-30 DIAGNOSIS — Z95818 Presence of other cardiac implants and grafts: Secondary | ICD-10-CM | POA: Diagnosis not present

## 2015-11-29 DIAGNOSIS — Z95818 Presence of other cardiac implants and grafts: Secondary | ICD-10-CM | POA: Diagnosis not present

## 2015-12-17 DIAGNOSIS — Z95818 Presence of other cardiac implants and grafts: Secondary | ICD-10-CM | POA: Diagnosis not present

## 2015-12-17 DIAGNOSIS — I2510 Atherosclerotic heart disease of native coronary artery without angina pectoris: Secondary | ICD-10-CM | POA: Diagnosis not present

## 2015-12-17 DIAGNOSIS — Z8673 Personal history of transient ischemic attack (TIA), and cerebral infarction without residual deficits: Secondary | ICD-10-CM | POA: Diagnosis not present

## 2015-12-17 DIAGNOSIS — E7800 Pure hypercholesterolemia, unspecified: Secondary | ICD-10-CM | POA: Diagnosis not present

## 2015-12-31 DIAGNOSIS — R5383 Other fatigue: Secondary | ICD-10-CM | POA: Diagnosis not present

## 2015-12-31 DIAGNOSIS — I2510 Atherosclerotic heart disease of native coronary artery without angina pectoris: Secondary | ICD-10-CM | POA: Diagnosis not present

## 2015-12-31 DIAGNOSIS — I69359 Hemiplegia and hemiparesis following cerebral infarction affecting unspecified side: Secondary | ICD-10-CM | POA: Diagnosis not present

## 2015-12-31 DIAGNOSIS — E7800 Pure hypercholesterolemia, unspecified: Secondary | ICD-10-CM | POA: Diagnosis not present

## 2015-12-31 DIAGNOSIS — Z6825 Body mass index (BMI) 25.0-25.9, adult: Secondary | ICD-10-CM | POA: Diagnosis not present

## 2015-12-31 DIAGNOSIS — E663 Overweight: Secondary | ICD-10-CM | POA: Diagnosis not present

## 2015-12-31 DIAGNOSIS — Z8709 Personal history of other diseases of the respiratory system: Secondary | ICD-10-CM | POA: Diagnosis not present

## 2015-12-31 DIAGNOSIS — E538 Deficiency of other specified B group vitamins: Secondary | ICD-10-CM | POA: Diagnosis not present

## 2015-12-31 DIAGNOSIS — Z79899 Other long term (current) drug therapy: Secondary | ICD-10-CM | POA: Diagnosis not present

## 2016-01-17 DIAGNOSIS — Z8673 Personal history of transient ischemic attack (TIA), and cerebral infarction without residual deficits: Secondary | ICD-10-CM | POA: Diagnosis not present

## 2016-01-17 DIAGNOSIS — I2510 Atherosclerotic heart disease of native coronary artery without angina pectoris: Secondary | ICD-10-CM | POA: Diagnosis not present

## 2016-01-17 DIAGNOSIS — Q211 Atrial septal defect: Secondary | ICD-10-CM | POA: Diagnosis not present

## 2016-01-17 DIAGNOSIS — Z95818 Presence of other cardiac implants and grafts: Secondary | ICD-10-CM | POA: Diagnosis not present

## 2016-01-28 DIAGNOSIS — Z95818 Presence of other cardiac implants and grafts: Secondary | ICD-10-CM | POA: Diagnosis not present

## 2016-02-07 DIAGNOSIS — D649 Anemia, unspecified: Secondary | ICD-10-CM | POA: Diagnosis not present

## 2016-02-26 DIAGNOSIS — Z6825 Body mass index (BMI) 25.0-25.9, adult: Secondary | ICD-10-CM | POA: Diagnosis not present

## 2016-02-26 DIAGNOSIS — L02211 Cutaneous abscess of abdominal wall: Secondary | ICD-10-CM | POA: Diagnosis not present

## 2016-02-27 DIAGNOSIS — Z95818 Presence of other cardiac implants and grafts: Secondary | ICD-10-CM | POA: Diagnosis not present

## 2016-03-11 DIAGNOSIS — D649 Anemia, unspecified: Secondary | ICD-10-CM | POA: Diagnosis not present

## 2016-03-28 DIAGNOSIS — Z95818 Presence of other cardiac implants and grafts: Secondary | ICD-10-CM | POA: Diagnosis not present

## 2016-04-27 DIAGNOSIS — Z95818 Presence of other cardiac implants and grafts: Secondary | ICD-10-CM | POA: Diagnosis not present

## 2016-04-27 DIAGNOSIS — R351 Nocturia: Secondary | ICD-10-CM | POA: Diagnosis not present

## 2016-04-27 DIAGNOSIS — R310 Gross hematuria: Secondary | ICD-10-CM | POA: Diagnosis not present

## 2016-04-27 DIAGNOSIS — R300 Dysuria: Secondary | ICD-10-CM | POA: Diagnosis not present

## 2016-05-27 DIAGNOSIS — Z95818 Presence of other cardiac implants and grafts: Secondary | ICD-10-CM | POA: Diagnosis not present

## 2016-06-02 ENCOUNTER — Ambulatory Visit: Payer: Medicare Other | Admitting: Diagnostic Neuroimaging

## 2016-06-03 DIAGNOSIS — K5190 Ulcerative colitis, unspecified, without complications: Secondary | ICD-10-CM | POA: Diagnosis not present

## 2016-06-03 DIAGNOSIS — Z8546 Personal history of malignant neoplasm of prostate: Secondary | ICD-10-CM | POA: Diagnosis not present

## 2016-06-03 DIAGNOSIS — Z1211 Encounter for screening for malignant neoplasm of colon: Secondary | ICD-10-CM | POA: Diagnosis not present

## 2016-06-11 DIAGNOSIS — Z23 Encounter for immunization: Secondary | ICD-10-CM | POA: Diagnosis not present

## 2016-06-11 DIAGNOSIS — Z1211 Encounter for screening for malignant neoplasm of colon: Secondary | ICD-10-CM | POA: Diagnosis not present

## 2016-06-26 DIAGNOSIS — Z95818 Presence of other cardiac implants and grafts: Secondary | ICD-10-CM | POA: Diagnosis not present

## 2016-07-02 DIAGNOSIS — E538 Deficiency of other specified B group vitamins: Secondary | ICD-10-CM | POA: Diagnosis not present

## 2016-07-02 DIAGNOSIS — G629 Polyneuropathy, unspecified: Secondary | ICD-10-CM | POA: Diagnosis not present

## 2016-07-02 DIAGNOSIS — E7800 Pure hypercholesterolemia, unspecified: Secondary | ICD-10-CM | POA: Diagnosis not present

## 2016-07-02 DIAGNOSIS — Z1389 Encounter for screening for other disorder: Secondary | ICD-10-CM | POA: Diagnosis not present

## 2016-07-02 DIAGNOSIS — I2510 Atherosclerotic heart disease of native coronary artery without angina pectoris: Secondary | ICD-10-CM | POA: Diagnosis not present

## 2016-07-02 DIAGNOSIS — D649 Anemia, unspecified: Secondary | ICD-10-CM | POA: Diagnosis not present

## 2016-07-02 DIAGNOSIS — C61 Malignant neoplasm of prostate: Secondary | ICD-10-CM | POA: Diagnosis not present

## 2016-07-02 DIAGNOSIS — I69359 Hemiplegia and hemiparesis following cerebral infarction affecting unspecified side: Secondary | ICD-10-CM | POA: Diagnosis not present

## 2016-07-02 DIAGNOSIS — Z6825 Body mass index (BMI) 25.0-25.9, adult: Secondary | ICD-10-CM | POA: Diagnosis not present

## 2016-07-02 DIAGNOSIS — Z9181 History of falling: Secondary | ICD-10-CM | POA: Diagnosis not present

## 2016-07-26 DIAGNOSIS — Z95818 Presence of other cardiac implants and grafts: Secondary | ICD-10-CM | POA: Diagnosis not present

## 2016-08-18 DIAGNOSIS — C44529 Squamous cell carcinoma of skin of other part of trunk: Secondary | ICD-10-CM | POA: Diagnosis not present

## 2016-08-18 DIAGNOSIS — L821 Other seborrheic keratosis: Secondary | ICD-10-CM | POA: Diagnosis not present

## 2016-08-18 DIAGNOSIS — C44519 Basal cell carcinoma of skin of other part of trunk: Secondary | ICD-10-CM | POA: Diagnosis not present

## 2016-08-18 DIAGNOSIS — L570 Actinic keratosis: Secondary | ICD-10-CM | POA: Diagnosis not present

## 2016-08-25 DIAGNOSIS — Z95818 Presence of other cardiac implants and grafts: Secondary | ICD-10-CM | POA: Diagnosis not present

## 2016-09-01 DIAGNOSIS — C44519 Basal cell carcinoma of skin of other part of trunk: Secondary | ICD-10-CM | POA: Diagnosis not present

## 2016-09-21 DIAGNOSIS — C44519 Basal cell carcinoma of skin of other part of trunk: Secondary | ICD-10-CM | POA: Diagnosis not present

## 2016-09-23 DIAGNOSIS — H2703 Aphakia, bilateral: Secondary | ICD-10-CM | POA: Diagnosis not present

## 2016-09-23 DIAGNOSIS — H3530 Unspecified macular degeneration: Secondary | ICD-10-CM | POA: Diagnosis not present

## 2016-09-24 DIAGNOSIS — C44629 Squamous cell carcinoma of skin of left upper limb, including shoulder: Secondary | ICD-10-CM | POA: Diagnosis not present

## 2016-09-24 DIAGNOSIS — Z95818 Presence of other cardiac implants and grafts: Secondary | ICD-10-CM | POA: Diagnosis not present

## 2016-09-24 DIAGNOSIS — D045 Carcinoma in situ of skin of trunk: Secondary | ICD-10-CM | POA: Diagnosis not present

## 2016-09-30 IMAGING — CT CT HEAD W/O CM
2 series · 16 of 30 positions shown, 20 images · non-contrast
Comparison: 09/28/2014 MRI

CLINICAL DATA: Aphasia, weakness. Difficulty with speech this
morning.

EXAM:
CT HEAD WITHOUT CONTRAST
TECHNIQUE: Contiguous axial images were obtained from the base of the skull
through the vertex without intravenous contrast.

[Series 201: head w/o, idose (1) · axial · non-contrast · 0.49mm/px · z∈[+1082,+1217]mm · 13 of 33 slices shown, 17 images]
[im 3/33  brain]
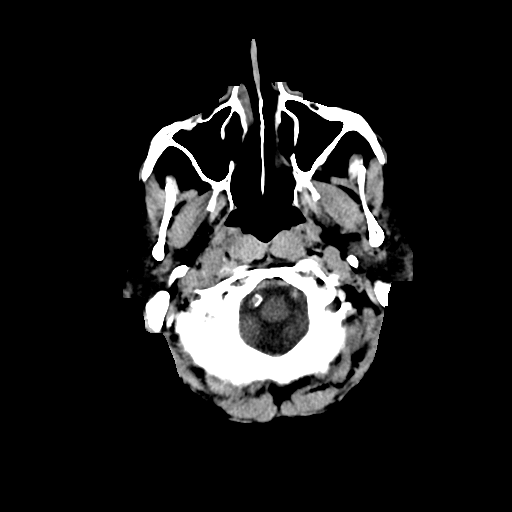
[im 3/33  bone]
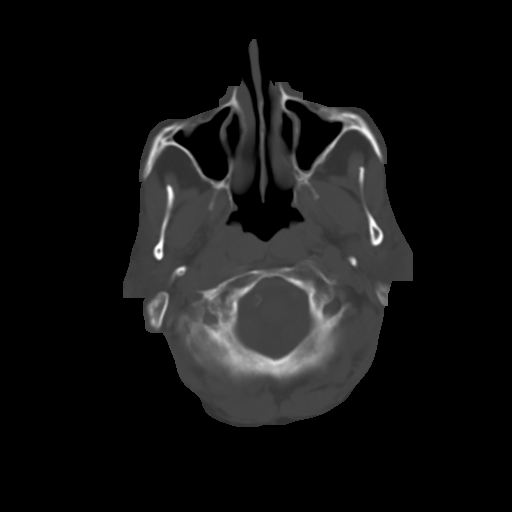
[im 5/33  brain]
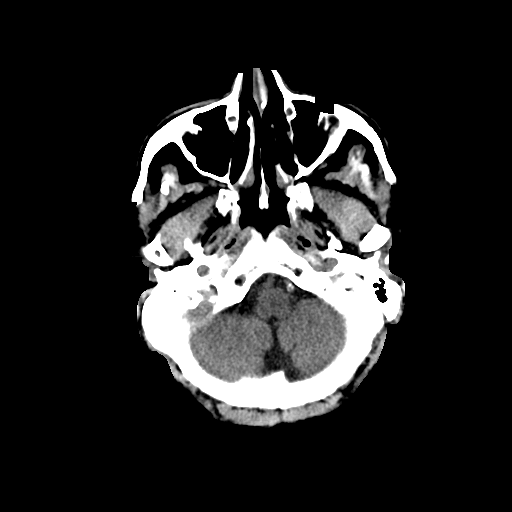
[im 7/33  brain]
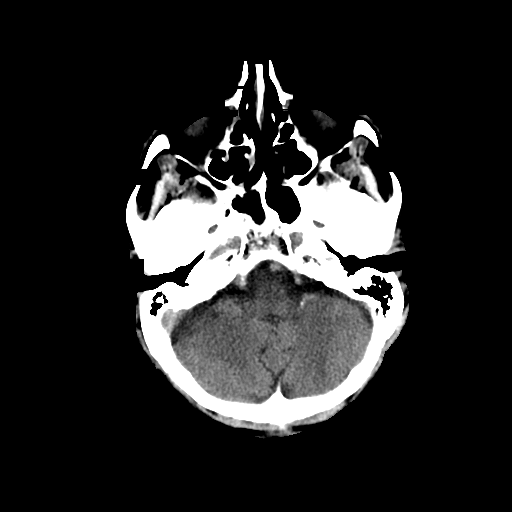
[im 10/33  brain]
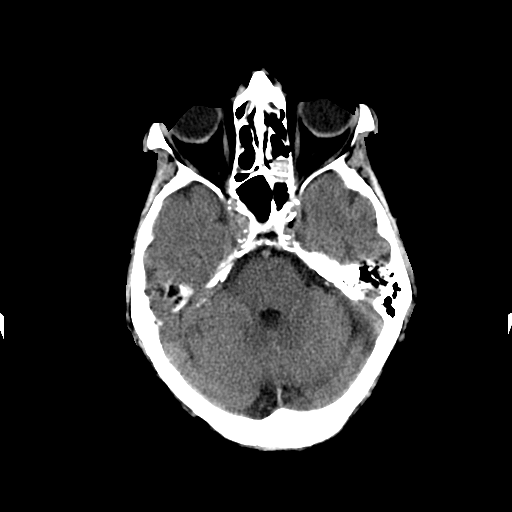
[im 12/33  brain]
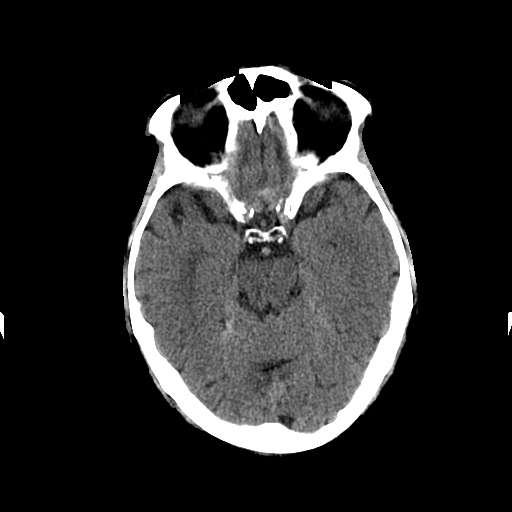
[im 12/33  bone]
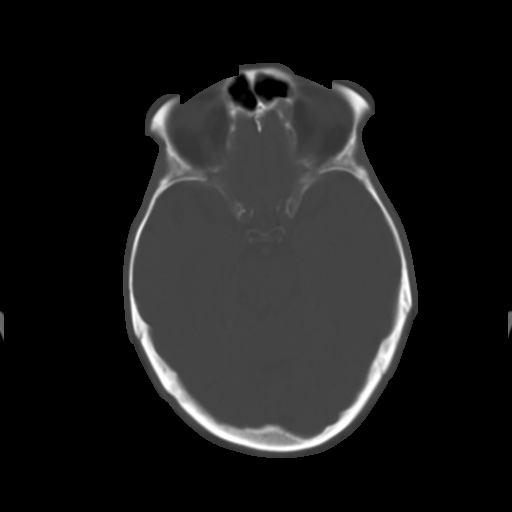
[im 14/33  brain]
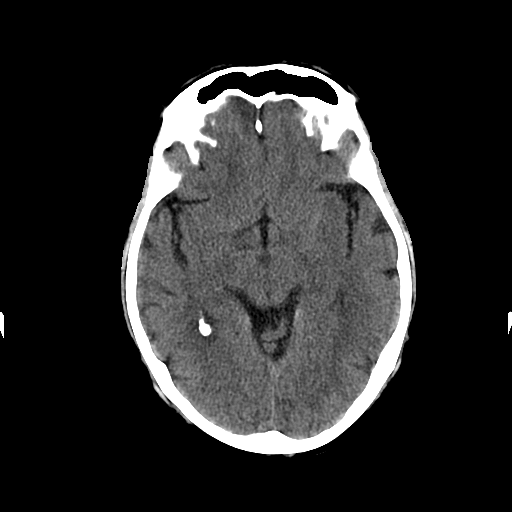
[im 17/33  brain]
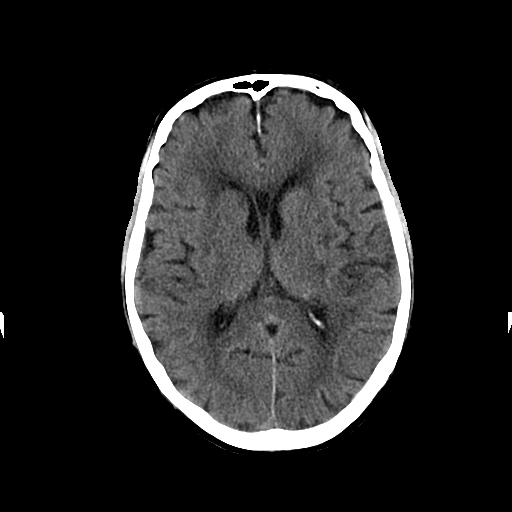
[im 19/33  brain]
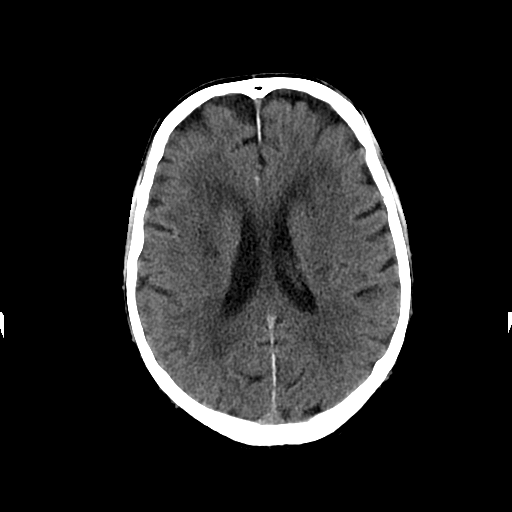
[im 21/33  brain]
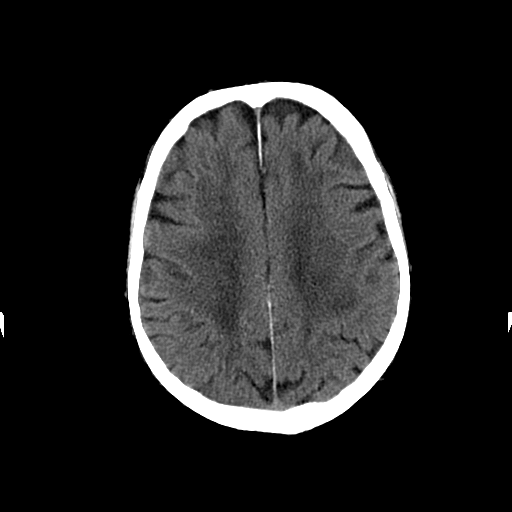
[im 21/33  bone]
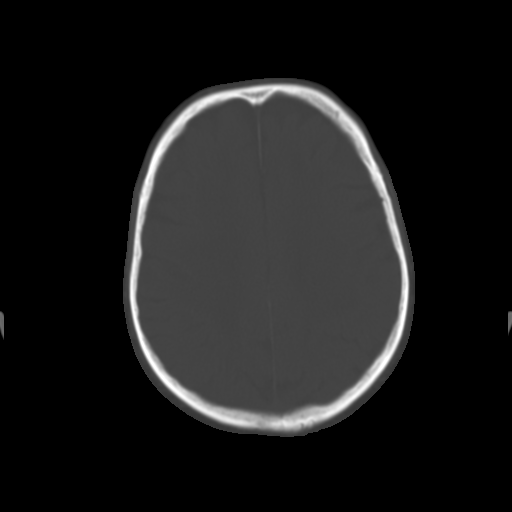
[im 23/33  brain]
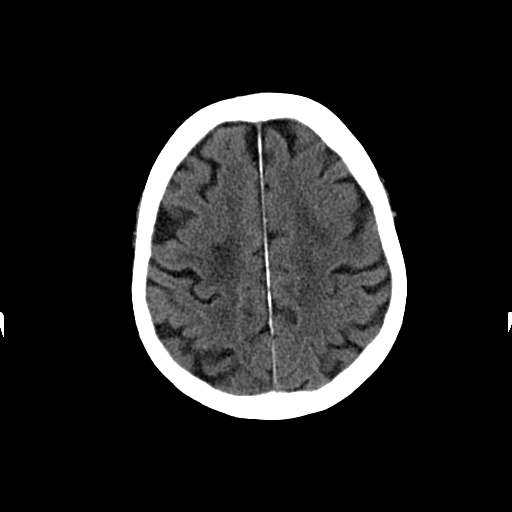
[im 26/33  brain]
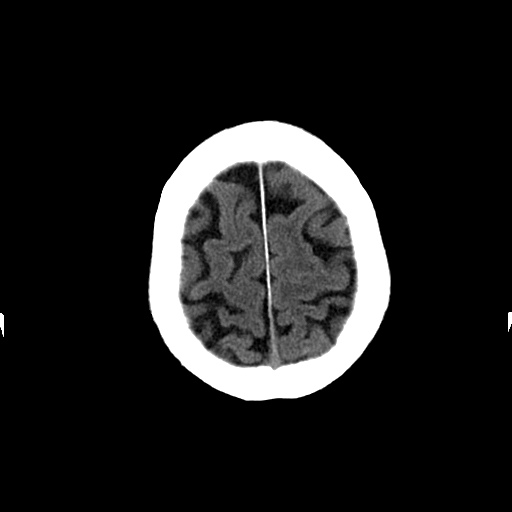
[im 28/33  brain]
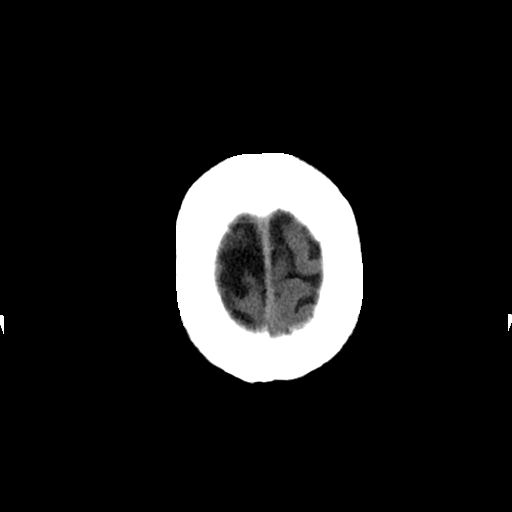
[im 30/33  brain]
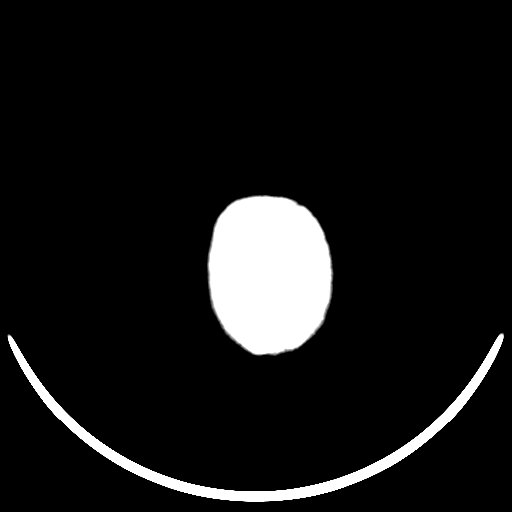
[im 30/33  bone]
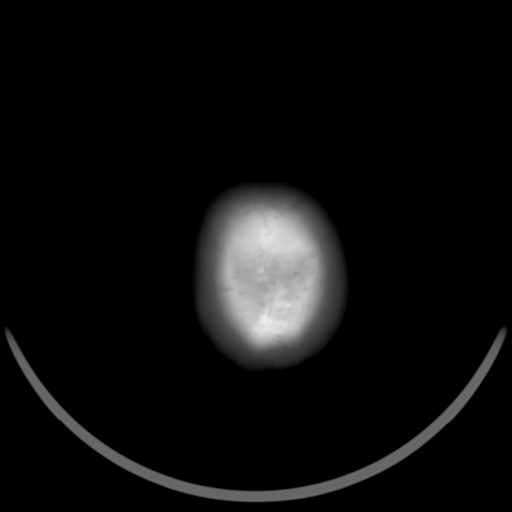

[Series 202: head w/o bone, idose (1) · axial · non-contrast · 0.49mm/px · z∈[+1082,+1127]mm · 3 of 33 slices shown]
[im 3/33  bone]
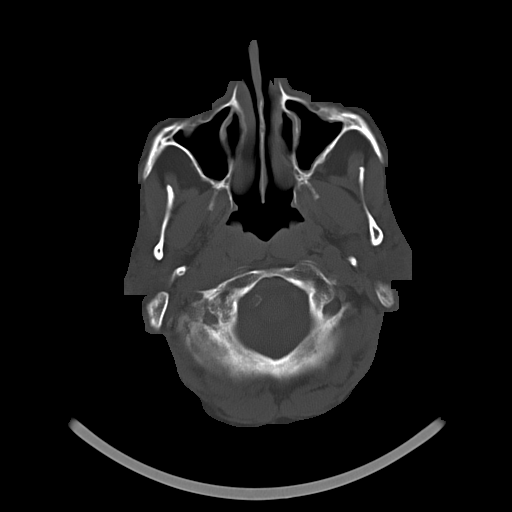
[im 7/33  bone]
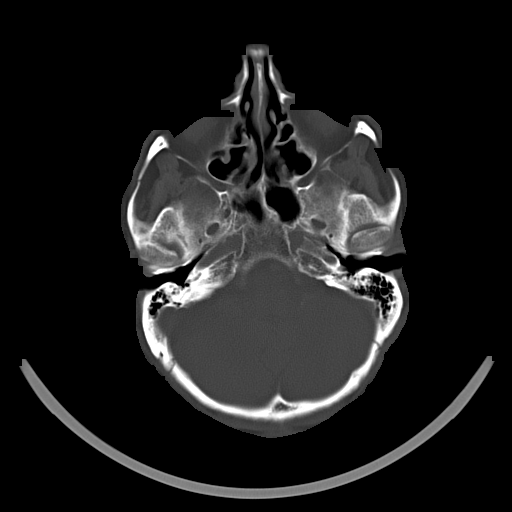
[im 12/33  bone]
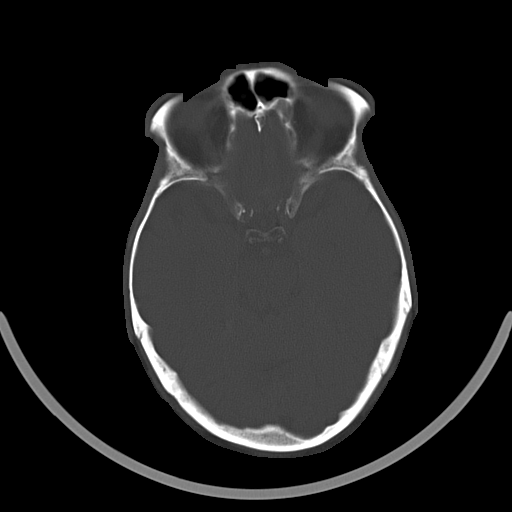

[16 of 30 positions shown; findings below may reference images not displayed]

FINDINGS: Low-density throughout the deep white matter most compatible with
chronic microvascular changes. No acute intracranial abnormality.
Specifically, no hemorrhage, hydrocephalus, mass lesion, acute
infarction, or significant intracranial injury. No acute calvarial
abnormality.

Mucosal thickening in the paranasal sinuses. Mastoid air cells are
clear.
IMPRESSION: Chronic small vessel disease throughout the deep white matter. No
acute intracranial abnormality.

## 2016-09-30 IMAGING — MR MR MRA HEAD W/O CM
11 of 13 series · 29 of 48 positions shown · non-contrast
Comparison: CT head without contrast from the same day. MRI brain
09/28/2014. MRA 01/17/2011

CLINICAL DATA: Slurred speech this morning. Personal history of
pain foramina bowel a closed in 4088. Previous infarct.

EXAM:
MRI HEAD WITHOUT CONTRAST
MRA HEAD WITHOUT CONTRAST
TECHNIQUE: Multiplanar, multiecho pulse sequences of the brain and surrounding
structures were obtained without intravenous contrast. Angiographic
images of the head were obtained using MRA technique without
contrast.

[Series 3: DWI · axial · 3.0mm · 0.94mm/px · z∈[-10,+137]mm · 6 of 100 slices shown (1 of 4)]
[im 1/100]
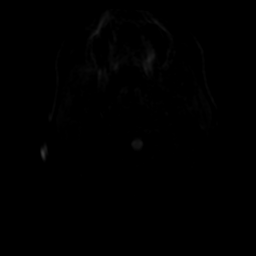
[im 20/100]
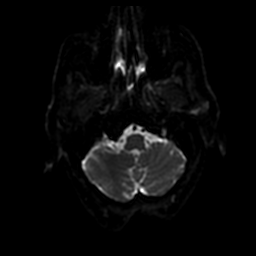
[im 40/100]
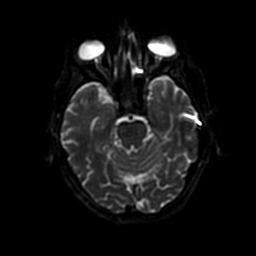
[im 60/100]
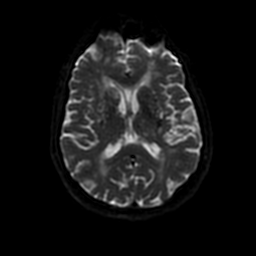
[im 80/100]
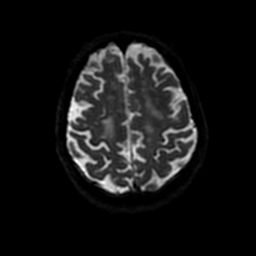
[im 100/100]
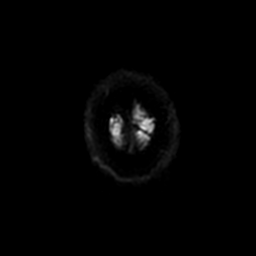

[Series 4: ax (id) 2 · axial · 1.2mm · 0.43mm/px · 1 of 200 slices shown]
[im 1/200]
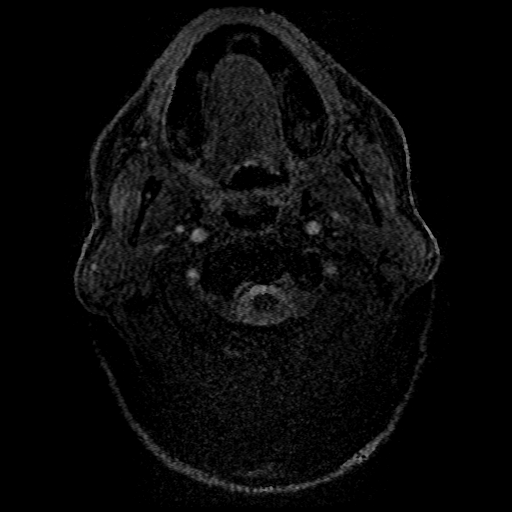

[Series 6: T2 · axial · 5.0mm · 0.47mm/px · z∈[-16,+128]mm · 2 of 25 slices shown (1 of 2)]
[im 1/25]
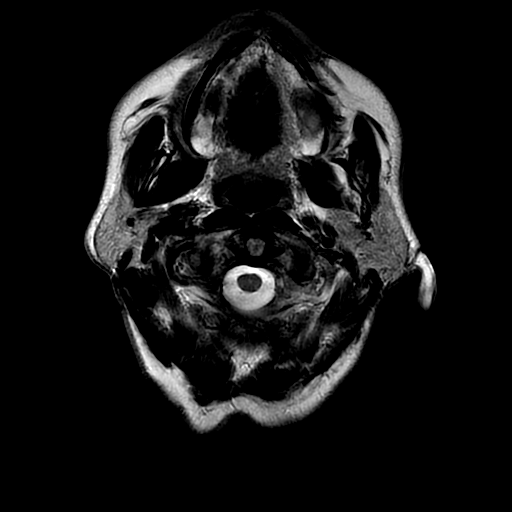
[im 25/25]
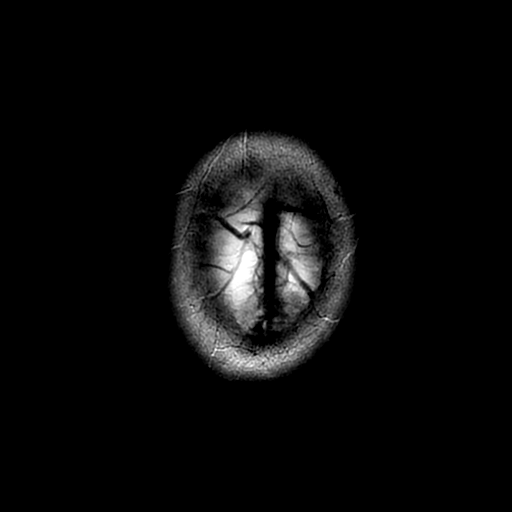

[Series 7: FLAIR · axial · 5.0mm · 0.47mm/px · z∈[-16,+128]mm · 2 of 25 slices shown (1 of 3)]
[im 1/25]
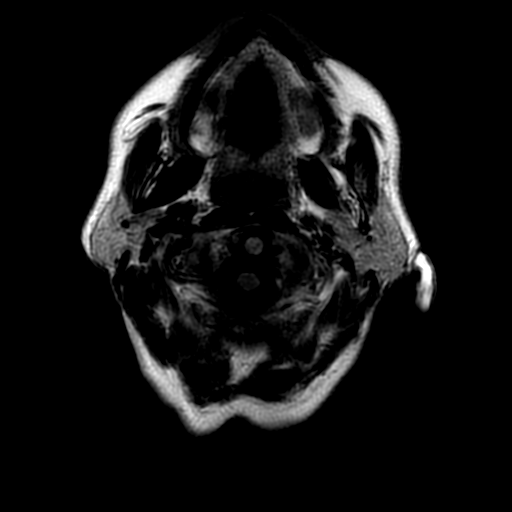
[im 25/25]
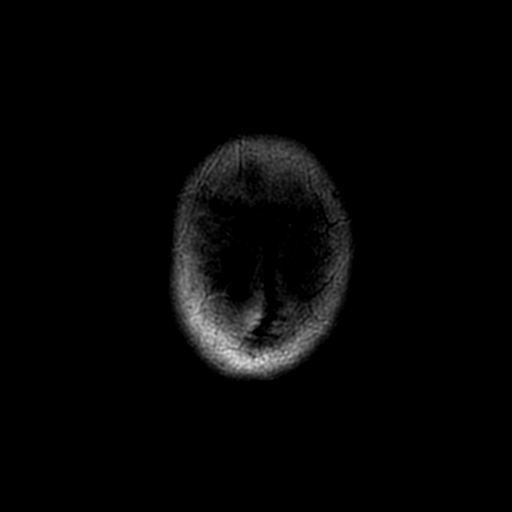

[Series 8: FLAIR · sagittal · 5.0mm · 0.47mm/px · 2 of 24 slices shown (2 of 3)]
[im 1/24]
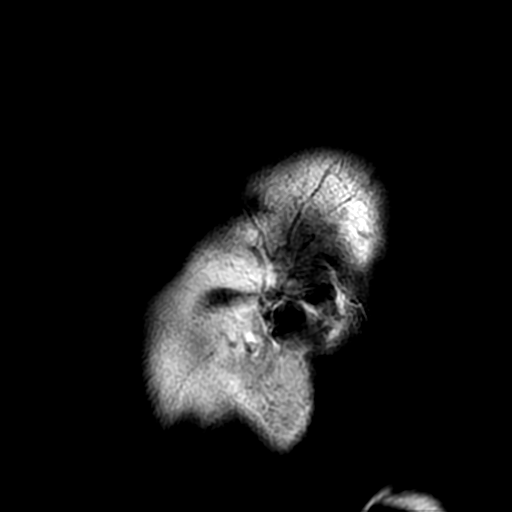
[im 24/24]
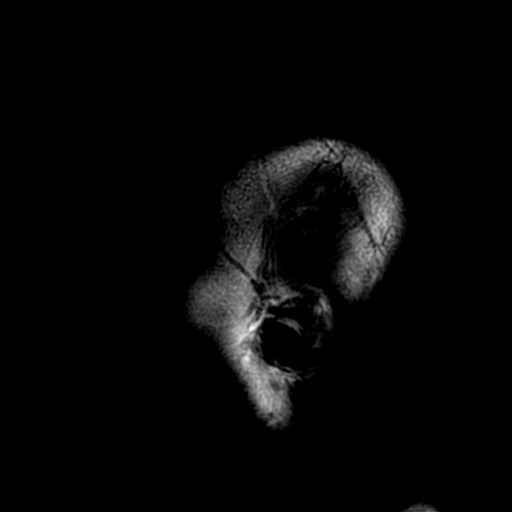

[Series 11: GRE · axial · 5.0mm · 0.47mm/px · z∈[-16,+128]mm · 2 of 25 slices shown]
[im 1/25]
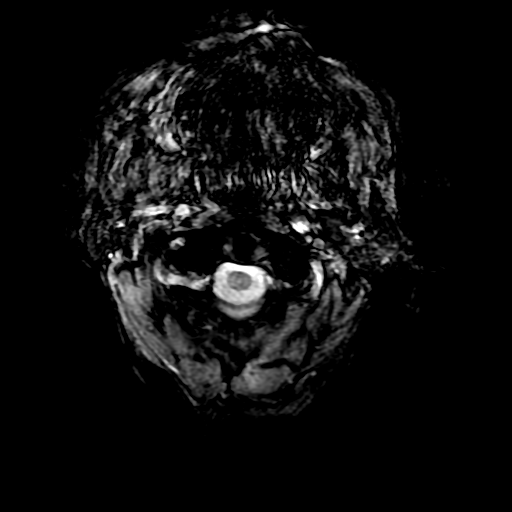
[im 25/25]
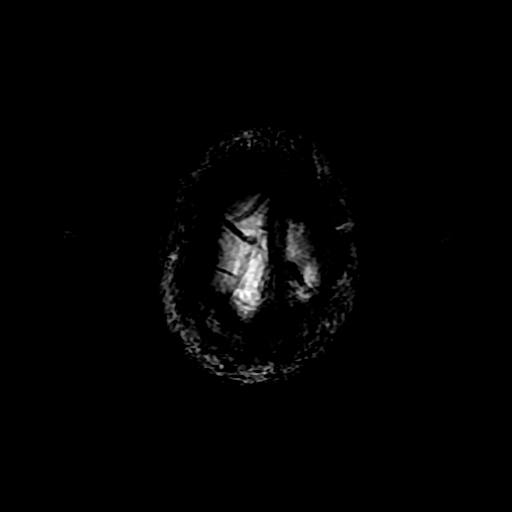

[Series 12: T2 · coronal · 5.0mm · 0.47mm/px · 2 of 36 slices shown (2 of 2)]
[im 1/36]
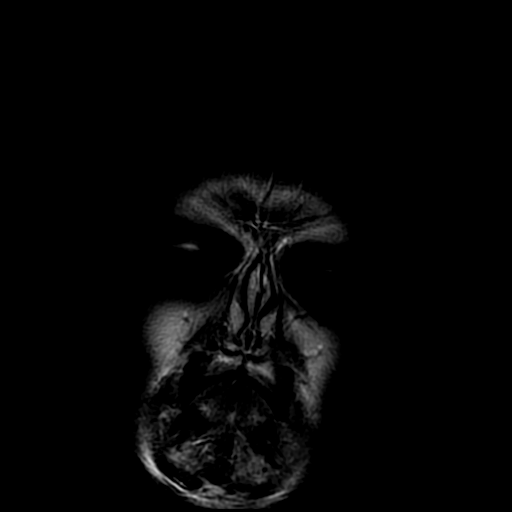
[im 36/36]
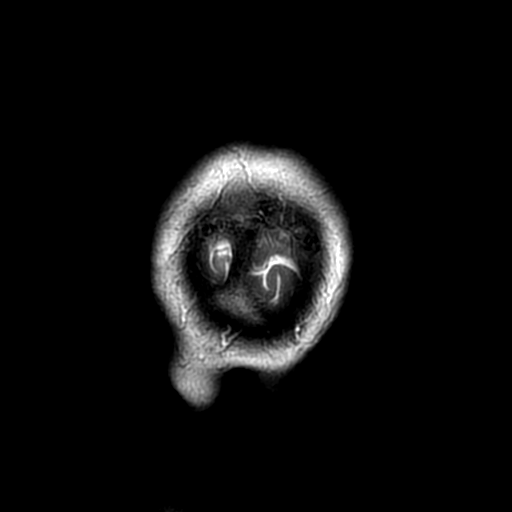

[Series 13: DWI · coronal · 5.0mm · 0.94mm/px · 5 of 72 slices shown (2 of 4)]
[im 1/72]
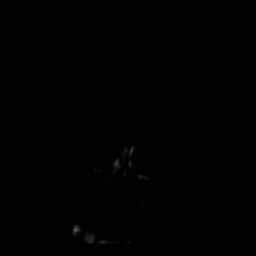
[im 18/72]
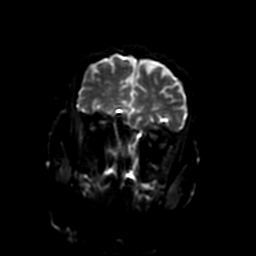
[im 36/72]
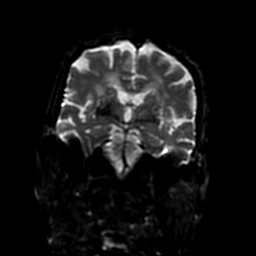
[im 54/72]
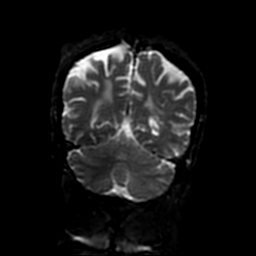
[im 72/72]
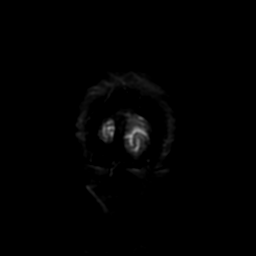

[Series 14: FLAIR · axial · 5.0mm · 0.47mm/px · z∈[-16,+128]mm · 2 of 25 slices shown (3 of 3)]
[im 1/25]
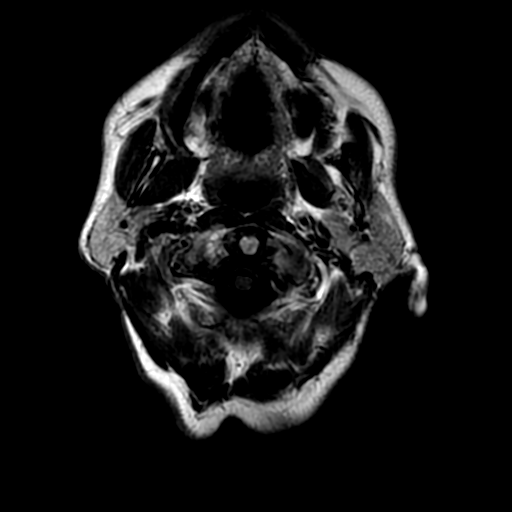
[im 25/25]
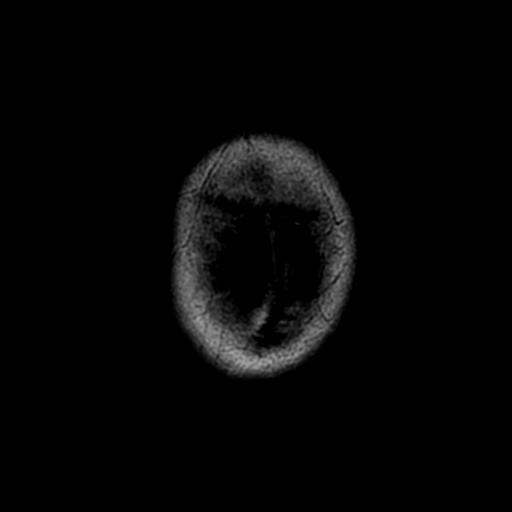

[Series 300: DWI · axial · 3.0mm · 0.94mm/px · z∈[-10,+137]mm · 3 of 50 slices shown (3 of 4)]
[im 1/50]
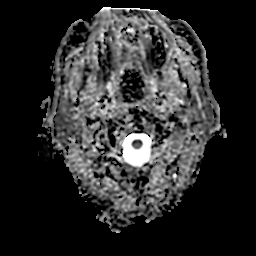
[im 25/50]
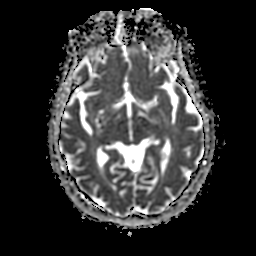
[im 50/50]
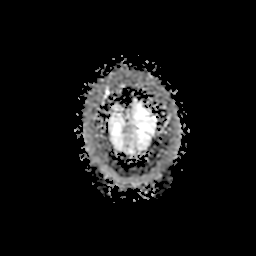

[Series 1300: DWI · coronal · 5.0mm · 0.94mm/px · 2 of 34 slices shown (4 of 4)]
[im 1/34]
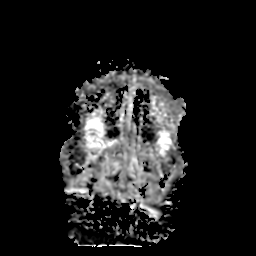
[im 34/34]
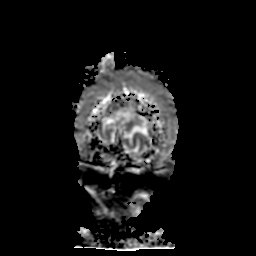

[29 of 48 positions shown; findings below may reference images not displayed]

FINDINGS: MRI HEAD FINDINGS

An acute nonhemorrhagic infarct is present within the pre motor
cortex on in images 38-44 of series 3.

Extensive periventricular and subcortical T2 changes bilaterally are
not significantly changed from the prior exam. Abnormal signal
within the left vertebral artery is compatible with slower partially
occluded flow. Flow is present in the anterior circulation. The
globes and orbits are intact. Stents mucosal thickening is present
throughout the anterior paranasal sinuses. There is some fluid in
the right mastoid air cells. No obstructing nasopharyngeal lesion is
present.

MRA HEAD FINDINGS

There is fusiform dilation of the right internal carotid artery
below the skullbase without significant interval change. The
internal carotid arteries are otherwise within normal limits to the
ICA termini bilaterally. The A1 and M1 segments are normal. The
anterior communicating artery is patent. MCA bifurcations are within
normal limits. Moderate attenuation of MCA branch vessels
bilaterally is similar to the prior exam.

A high-grade stenosis of the left vertebral artery is again noted at
the dural margin. There is marked attenuation of the distal
vertebral artery, just proximal to the vertebrobasilar junction. The
left PICA origin is visualized and intact. The right PICA origin is
not visualized. The right AICA is present. The basilar artery is
within normal limits. Both posterior cerebral arteries originate the
basilar tip. The PCA branch vessels demonstrate mild distal small
vessel attenuation.
IMPRESSION: 1. Acute nonhemorrhagic infarct within the left pre motor cortex of
the left frontal lobe.
2. Otherwise stable diffuse atrophy and white matter disease
compatible with chronic microvascular ischemia.
3. Stable fusiform dilation of the high cervical right internal
carotid artery.
4. High-grade stenosis of the left vertebral artery both the dural
margin and just below the vertebrobasilar junction.
5. Moderate diffuse small vessel disease.
These results were called by telephone at the time of interpretation
on 03/31/2015 at [DATE] to Dr. Latonia Kaminski , who verbally
acknowledged these results.

## 2016-10-24 DIAGNOSIS — Z95818 Presence of other cardiac implants and grafts: Secondary | ICD-10-CM | POA: Diagnosis not present

## 2016-11-18 DIAGNOSIS — E538 Deficiency of other specified B group vitamins: Secondary | ICD-10-CM | POA: Diagnosis not present

## 2016-11-18 DIAGNOSIS — Z79899 Other long term (current) drug therapy: Secondary | ICD-10-CM | POA: Diagnosis not present

## 2016-11-18 DIAGNOSIS — I69359 Hemiplegia and hemiparesis following cerebral infarction affecting unspecified side: Secondary | ICD-10-CM | POA: Diagnosis not present

## 2016-11-18 DIAGNOSIS — I2510 Atherosclerotic heart disease of native coronary artery without angina pectoris: Secondary | ICD-10-CM | POA: Diagnosis not present

## 2016-11-18 DIAGNOSIS — E7800 Pure hypercholesterolemia, unspecified: Secondary | ICD-10-CM | POA: Diagnosis not present

## 2016-11-18 DIAGNOSIS — K219 Gastro-esophageal reflux disease without esophagitis: Secondary | ICD-10-CM | POA: Diagnosis not present

## 2016-11-18 DIAGNOSIS — J069 Acute upper respiratory infection, unspecified: Secondary | ICD-10-CM | POA: Diagnosis not present

## 2016-11-23 DIAGNOSIS — Z95818 Presence of other cardiac implants and grafts: Secondary | ICD-10-CM | POA: Diagnosis not present

## 2016-12-04 DIAGNOSIS — D649 Anemia, unspecified: Secondary | ICD-10-CM | POA: Diagnosis not present

## 2016-12-11 DIAGNOSIS — D649 Anemia, unspecified: Secondary | ICD-10-CM | POA: Diagnosis not present

## 2016-12-18 DIAGNOSIS — D72819 Decreased white blood cell count, unspecified: Secondary | ICD-10-CM | POA: Diagnosis not present

## 2016-12-18 DIAGNOSIS — D649 Anemia, unspecified: Secondary | ICD-10-CM | POA: Diagnosis not present

## 2016-12-23 ENCOUNTER — Other Ambulatory Visit (HOSPITAL_COMMUNITY)
Admission: RE | Admit: 2016-12-23 | Disposition: A | Discharge status: Home / Self Care | Payer: Medicare Other | Source: Ambulatory Visit | Attending: Oncology | Admitting: Oncology

## 2016-12-23 DIAGNOSIS — D649 Anemia, unspecified: Secondary | ICD-10-CM | POA: Diagnosis not present

## 2016-12-23 DIAGNOSIS — Z95818 Presence of other cardiac implants and grafts: Secondary | ICD-10-CM | POA: Diagnosis not present

## 2016-12-23 DIAGNOSIS — D721 Eosinophilia: Secondary | ICD-10-CM | POA: Diagnosis not present

## 2017-01-01 DIAGNOSIS — D469 Myelodysplastic syndrome, unspecified: Secondary | ICD-10-CM | POA: Diagnosis not present

## 2017-01-01 DIAGNOSIS — D649 Anemia, unspecified: Secondary | ICD-10-CM | POA: Diagnosis not present

## 2017-01-01 LAB — TISSUE HYBRIDIZATION (BONE MARROW)-NCBH

## 2017-01-01 LAB — CHROMOSOME ANALYSIS, BONE MARROW

## 2017-01-04 DIAGNOSIS — D469 Myelodysplastic syndrome, unspecified: Secondary | ICD-10-CM | POA: Diagnosis not present

## 2017-01-12 ENCOUNTER — Encounter (HOSPITAL_COMMUNITY): Payer: Self-pay

## 2017-01-22 DIAGNOSIS — Z95818 Presence of other cardiac implants and grafts: Secondary | ICD-10-CM | POA: Diagnosis not present

## 2017-01-25 DIAGNOSIS — D469 Myelodysplastic syndrome, unspecified: Secondary | ICD-10-CM | POA: Diagnosis not present

## 2017-01-28 DIAGNOSIS — S299XXA Unspecified injury of thorax, initial encounter: Secondary | ICD-10-CM | POA: Diagnosis not present

## 2017-01-28 DIAGNOSIS — R112 Nausea with vomiting, unspecified: Secondary | ICD-10-CM | POA: Diagnosis not present

## 2017-01-28 DIAGNOSIS — R51 Headache: Secondary | ICD-10-CM | POA: Diagnosis not present

## 2017-01-28 DIAGNOSIS — N281 Cyst of kidney, acquired: Secondary | ICD-10-CM | POA: Diagnosis not present

## 2017-01-28 DIAGNOSIS — R531 Weakness: Secondary | ICD-10-CM | POA: Diagnosis not present

## 2017-01-28 DIAGNOSIS — D469 Myelodysplastic syndrome, unspecified: Secondary | ICD-10-CM | POA: Diagnosis not present

## 2017-01-28 DIAGNOSIS — S0990XA Unspecified injury of head, initial encounter: Secondary | ICD-10-CM | POA: Diagnosis not present

## 2017-01-28 DIAGNOSIS — I2510 Atherosclerotic heart disease of native coronary artery without angina pectoris: Secondary | ICD-10-CM | POA: Diagnosis not present

## 2017-01-28 DIAGNOSIS — R42 Dizziness and giddiness: Secondary | ICD-10-CM | POA: Diagnosis not present

## 2017-01-28 DIAGNOSIS — N179 Acute kidney failure, unspecified: Secondary | ICD-10-CM | POA: Diagnosis not present

## 2017-01-28 DIAGNOSIS — K5730 Diverticulosis of large intestine without perforation or abscess without bleeding: Secondary | ICD-10-CM | POA: Diagnosis not present

## 2017-01-28 DIAGNOSIS — E876 Hypokalemia: Secondary | ICD-10-CM | POA: Diagnosis not present

## 2017-01-28 DIAGNOSIS — R404 Transient alteration of awareness: Secondary | ICD-10-CM | POA: Diagnosis not present

## 2017-01-29 DIAGNOSIS — R531 Weakness: Secondary | ICD-10-CM | POA: Diagnosis not present

## 2017-01-29 DIAGNOSIS — I2510 Atherosclerotic heart disease of native coronary artery without angina pectoris: Secondary | ICD-10-CM | POA: Diagnosis not present

## 2017-01-29 DIAGNOSIS — R112 Nausea with vomiting, unspecified: Secondary | ICD-10-CM | POA: Diagnosis not present

## 2017-01-29 DIAGNOSIS — D469 Myelodysplastic syndrome, unspecified: Secondary | ICD-10-CM | POA: Diagnosis not present

## 2017-01-29 DIAGNOSIS — G459 Transient cerebral ischemic attack, unspecified: Secondary | ICD-10-CM | POA: Diagnosis not present

## 2017-01-29 DIAGNOSIS — R42 Dizziness and giddiness: Secondary | ICD-10-CM | POA: Diagnosis not present

## 2017-01-29 DIAGNOSIS — R2689 Other abnormalities of gait and mobility: Secondary | ICD-10-CM | POA: Diagnosis not present

## 2017-01-29 DIAGNOSIS — N179 Acute kidney failure, unspecified: Secondary | ICD-10-CM | POA: Diagnosis not present

## 2017-01-29 DIAGNOSIS — E876 Hypokalemia: Secondary | ICD-10-CM | POA: Diagnosis not present

## 2017-01-29 DIAGNOSIS — I6501 Occlusion and stenosis of right vertebral artery: Secondary | ICD-10-CM | POA: Diagnosis not present

## 2017-01-30 DIAGNOSIS — I361 Nonrheumatic tricuspid (valve) insufficiency: Secondary | ICD-10-CM | POA: Diagnosis not present

## 2017-01-30 DIAGNOSIS — I340 Nonrheumatic mitral (valve) insufficiency: Secondary | ICD-10-CM | POA: Diagnosis not present

## 2017-01-30 DIAGNOSIS — Z8673 Personal history of transient ischemic attack (TIA), and cerebral infarction without residual deficits: Secondary | ICD-10-CM | POA: Diagnosis not present

## 2017-01-30 DIAGNOSIS — M4802 Spinal stenosis, cervical region: Secondary | ICD-10-CM | POA: Diagnosis not present

## 2017-01-30 DIAGNOSIS — Z87891 Personal history of nicotine dependence: Secondary | ICD-10-CM | POA: Diagnosis not present

## 2017-01-30 DIAGNOSIS — R531 Weakness: Secondary | ICD-10-CM | POA: Diagnosis not present

## 2017-01-30 DIAGNOSIS — R270 Ataxia, unspecified: Secondary | ICD-10-CM | POA: Diagnosis present

## 2017-01-30 DIAGNOSIS — R112 Nausea with vomiting, unspecified: Secondary | ICD-10-CM | POA: Diagnosis not present

## 2017-01-30 DIAGNOSIS — E785 Hyperlipidemia, unspecified: Secondary | ICD-10-CM | POA: Diagnosis present

## 2017-01-30 DIAGNOSIS — I350 Nonrheumatic aortic (valve) stenosis: Secondary | ICD-10-CM | POA: Diagnosis not present

## 2017-01-30 DIAGNOSIS — E860 Dehydration: Secondary | ICD-10-CM | POA: Diagnosis present

## 2017-01-30 DIAGNOSIS — D469 Myelodysplastic syndrome, unspecified: Secondary | ICD-10-CM | POA: Diagnosis not present

## 2017-01-30 DIAGNOSIS — Z9181 History of falling: Secondary | ICD-10-CM | POA: Diagnosis not present

## 2017-01-30 DIAGNOSIS — R42 Dizziness and giddiness: Secondary | ICD-10-CM | POA: Diagnosis not present

## 2017-01-30 DIAGNOSIS — I10 Essential (primary) hypertension: Secondary | ICD-10-CM | POA: Diagnosis present

## 2017-01-30 DIAGNOSIS — Z66 Do not resuscitate: Secondary | ICD-10-CM | POA: Diagnosis present

## 2017-01-30 DIAGNOSIS — Z7982 Long term (current) use of aspirin: Secondary | ICD-10-CM | POA: Diagnosis not present

## 2017-01-30 DIAGNOSIS — I2510 Atherosclerotic heart disease of native coronary artery without angina pectoris: Secondary | ICD-10-CM | POA: Diagnosis not present

## 2017-01-30 DIAGNOSIS — E876 Hypokalemia: Secondary | ICD-10-CM | POA: Diagnosis not present

## 2017-01-30 DIAGNOSIS — Z951 Presence of aortocoronary bypass graft: Secondary | ICD-10-CM | POA: Diagnosis not present

## 2017-01-30 DIAGNOSIS — Z79899 Other long term (current) drug therapy: Secondary | ICD-10-CM | POA: Diagnosis not present

## 2017-01-30 DIAGNOSIS — N179 Acute kidney failure, unspecified: Secondary | ICD-10-CM | POA: Diagnosis not present

## 2017-01-30 DIAGNOSIS — I252 Old myocardial infarction: Secondary | ICD-10-CM | POA: Diagnosis not present

## 2017-02-04 DIAGNOSIS — R293 Abnormal posture: Secondary | ICD-10-CM | POA: Diagnosis not present

## 2017-02-04 DIAGNOSIS — R2689 Other abnormalities of gait and mobility: Secondary | ICD-10-CM | POA: Diagnosis not present

## 2017-02-04 DIAGNOSIS — M6281 Muscle weakness (generalized): Secondary | ICD-10-CM | POA: Diagnosis not present

## 2017-02-04 DIAGNOSIS — R278 Other lack of coordination: Secondary | ICD-10-CM | POA: Diagnosis not present

## 2017-02-04 DIAGNOSIS — M2560 Stiffness of unspecified joint, not elsewhere classified: Secondary | ICD-10-CM | POA: Diagnosis not present

## 2017-02-04 DIAGNOSIS — R2681 Unsteadiness on feet: Secondary | ICD-10-CM | POA: Diagnosis not present

## 2017-02-09 DIAGNOSIS — N179 Acute kidney failure, unspecified: Secondary | ICD-10-CM | POA: Diagnosis not present

## 2017-02-09 DIAGNOSIS — R531 Weakness: Secondary | ICD-10-CM | POA: Diagnosis not present

## 2017-02-09 DIAGNOSIS — D469 Myelodysplastic syndrome, unspecified: Secondary | ICD-10-CM | POA: Diagnosis not present

## 2017-02-09 DIAGNOSIS — Z79899 Other long term (current) drug therapy: Secondary | ICD-10-CM | POA: Diagnosis not present

## 2017-02-09 DIAGNOSIS — I10 Essential (primary) hypertension: Secondary | ICD-10-CM | POA: Diagnosis not present

## 2017-02-09 DIAGNOSIS — E876 Hypokalemia: Secondary | ICD-10-CM | POA: Diagnosis not present

## 2017-02-09 DIAGNOSIS — I639 Cerebral infarction, unspecified: Secondary | ICD-10-CM | POA: Diagnosis not present

## 2017-02-10 DIAGNOSIS — M6281 Muscle weakness (generalized): Secondary | ICD-10-CM | POA: Diagnosis not present

## 2017-02-10 DIAGNOSIS — M2560 Stiffness of unspecified joint, not elsewhere classified: Secondary | ICD-10-CM | POA: Diagnosis not present

## 2017-02-10 DIAGNOSIS — R2689 Other abnormalities of gait and mobility: Secondary | ICD-10-CM | POA: Diagnosis not present

## 2017-02-10 DIAGNOSIS — R293 Abnormal posture: Secondary | ICD-10-CM | POA: Diagnosis not present

## 2017-02-10 DIAGNOSIS — R2681 Unsteadiness on feet: Secondary | ICD-10-CM | POA: Diagnosis not present

## 2017-02-10 DIAGNOSIS — R278 Other lack of coordination: Secondary | ICD-10-CM | POA: Diagnosis not present

## 2017-02-12 DIAGNOSIS — M2560 Stiffness of unspecified joint, not elsewhere classified: Secondary | ICD-10-CM | POA: Diagnosis not present

## 2017-02-12 DIAGNOSIS — M6281 Muscle weakness (generalized): Secondary | ICD-10-CM | POA: Diagnosis not present

## 2017-02-12 DIAGNOSIS — R2681 Unsteadiness on feet: Secondary | ICD-10-CM | POA: Diagnosis not present

## 2017-02-12 DIAGNOSIS — R293 Abnormal posture: Secondary | ICD-10-CM | POA: Diagnosis not present

## 2017-02-12 DIAGNOSIS — R2689 Other abnormalities of gait and mobility: Secondary | ICD-10-CM | POA: Diagnosis not present

## 2017-02-12 DIAGNOSIS — R278 Other lack of coordination: Secondary | ICD-10-CM | POA: Diagnosis not present

## 2017-02-15 DIAGNOSIS — D469 Myelodysplastic syndrome, unspecified: Secondary | ICD-10-CM | POA: Diagnosis not present

## 2017-02-16 DIAGNOSIS — R278 Other lack of coordination: Secondary | ICD-10-CM | POA: Diagnosis not present

## 2017-02-16 DIAGNOSIS — R2681 Unsteadiness on feet: Secondary | ICD-10-CM | POA: Diagnosis not present

## 2017-02-16 DIAGNOSIS — R2689 Other abnormalities of gait and mobility: Secondary | ICD-10-CM | POA: Diagnosis not present

## 2017-02-16 DIAGNOSIS — M2560 Stiffness of unspecified joint, not elsewhere classified: Secondary | ICD-10-CM | POA: Diagnosis not present

## 2017-02-16 DIAGNOSIS — R293 Abnormal posture: Secondary | ICD-10-CM | POA: Diagnosis not present

## 2017-02-16 DIAGNOSIS — M6281 Muscle weakness (generalized): Secondary | ICD-10-CM | POA: Diagnosis not present

## 2017-02-18 DIAGNOSIS — R293 Abnormal posture: Secondary | ICD-10-CM | POA: Diagnosis not present

## 2017-02-18 DIAGNOSIS — M2560 Stiffness of unspecified joint, not elsewhere classified: Secondary | ICD-10-CM | POA: Diagnosis not present

## 2017-02-18 DIAGNOSIS — R2681 Unsteadiness on feet: Secondary | ICD-10-CM | POA: Diagnosis not present

## 2017-02-18 DIAGNOSIS — M6281 Muscle weakness (generalized): Secondary | ICD-10-CM | POA: Diagnosis not present

## 2017-02-18 DIAGNOSIS — R278 Other lack of coordination: Secondary | ICD-10-CM | POA: Diagnosis not present

## 2017-02-18 DIAGNOSIS — R2689 Other abnormalities of gait and mobility: Secondary | ICD-10-CM | POA: Diagnosis not present

## 2017-02-21 DIAGNOSIS — Z95818 Presence of other cardiac implants and grafts: Secondary | ICD-10-CM | POA: Diagnosis not present

## 2017-02-22 DIAGNOSIS — M6281 Muscle weakness (generalized): Secondary | ICD-10-CM | POA: Diagnosis not present

## 2017-02-22 DIAGNOSIS — R2681 Unsteadiness on feet: Secondary | ICD-10-CM | POA: Diagnosis not present

## 2017-02-22 DIAGNOSIS — R293 Abnormal posture: Secondary | ICD-10-CM | POA: Diagnosis not present

## 2017-02-22 DIAGNOSIS — M2560 Stiffness of unspecified joint, not elsewhere classified: Secondary | ICD-10-CM | POA: Diagnosis not present

## 2017-02-22 DIAGNOSIS — R2689 Other abnormalities of gait and mobility: Secondary | ICD-10-CM | POA: Diagnosis not present

## 2017-02-22 DIAGNOSIS — R278 Other lack of coordination: Secondary | ICD-10-CM | POA: Diagnosis not present

## 2017-02-24 DIAGNOSIS — M2560 Stiffness of unspecified joint, not elsewhere classified: Secondary | ICD-10-CM | POA: Diagnosis not present

## 2017-02-24 DIAGNOSIS — R2689 Other abnormalities of gait and mobility: Secondary | ICD-10-CM | POA: Diagnosis not present

## 2017-02-24 DIAGNOSIS — R293 Abnormal posture: Secondary | ICD-10-CM | POA: Diagnosis not present

## 2017-02-24 DIAGNOSIS — R2681 Unsteadiness on feet: Secondary | ICD-10-CM | POA: Diagnosis not present

## 2017-02-24 DIAGNOSIS — M6281 Muscle weakness (generalized): Secondary | ICD-10-CM | POA: Diagnosis not present

## 2017-02-24 DIAGNOSIS — R278 Other lack of coordination: Secondary | ICD-10-CM | POA: Diagnosis not present

## 2017-03-01 DIAGNOSIS — R293 Abnormal posture: Secondary | ICD-10-CM | POA: Diagnosis not present

## 2017-03-01 DIAGNOSIS — M2560 Stiffness of unspecified joint, not elsewhere classified: Secondary | ICD-10-CM | POA: Diagnosis not present

## 2017-03-01 DIAGNOSIS — R2681 Unsteadiness on feet: Secondary | ICD-10-CM | POA: Diagnosis not present

## 2017-03-01 DIAGNOSIS — R2689 Other abnormalities of gait and mobility: Secondary | ICD-10-CM | POA: Diagnosis not present

## 2017-03-01 DIAGNOSIS — R278 Other lack of coordination: Secondary | ICD-10-CM | POA: Diagnosis not present

## 2017-03-01 DIAGNOSIS — M6281 Muscle weakness (generalized): Secondary | ICD-10-CM | POA: Diagnosis not present

## 2017-03-04 DIAGNOSIS — R2689 Other abnormalities of gait and mobility: Secondary | ICD-10-CM | POA: Diagnosis not present

## 2017-03-04 DIAGNOSIS — R278 Other lack of coordination: Secondary | ICD-10-CM | POA: Diagnosis not present

## 2017-03-04 DIAGNOSIS — M2560 Stiffness of unspecified joint, not elsewhere classified: Secondary | ICD-10-CM | POA: Diagnosis not present

## 2017-03-04 DIAGNOSIS — M6281 Muscle weakness (generalized): Secondary | ICD-10-CM | POA: Diagnosis not present

## 2017-03-04 DIAGNOSIS — R293 Abnormal posture: Secondary | ICD-10-CM | POA: Diagnosis not present

## 2017-03-04 DIAGNOSIS — R2681 Unsteadiness on feet: Secondary | ICD-10-CM | POA: Diagnosis not present

## 2017-03-09 ENCOUNTER — Telehealth: Payer: Self-pay | Admitting: Diagnostic Neuroimaging

## 2017-03-09 DIAGNOSIS — M2560 Stiffness of unspecified joint, not elsewhere classified: Secondary | ICD-10-CM | POA: Diagnosis not present

## 2017-03-09 DIAGNOSIS — R2681 Unsteadiness on feet: Secondary | ICD-10-CM | POA: Diagnosis not present

## 2017-03-09 DIAGNOSIS — M6281 Muscle weakness (generalized): Secondary | ICD-10-CM | POA: Diagnosis not present

## 2017-03-09 DIAGNOSIS — R293 Abnormal posture: Secondary | ICD-10-CM | POA: Diagnosis not present

## 2017-03-09 DIAGNOSIS — R278 Other lack of coordination: Secondary | ICD-10-CM | POA: Diagnosis not present

## 2017-03-09 DIAGNOSIS — I10 Essential (primary) hypertension: Secondary | ICD-10-CM | POA: Diagnosis not present

## 2017-03-09 DIAGNOSIS — H903 Sensorineural hearing loss, bilateral: Secondary | ICD-10-CM | POA: Diagnosis not present

## 2017-03-09 DIAGNOSIS — I639 Cerebral infarction, unspecified: Secondary | ICD-10-CM | POA: Diagnosis not present

## 2017-03-09 DIAGNOSIS — R2689 Other abnormalities of gait and mobility: Secondary | ICD-10-CM | POA: Diagnosis not present

## 2017-03-09 DIAGNOSIS — R42 Dizziness and giddiness: Secondary | ICD-10-CM | POA: Diagnosis not present

## 2017-03-09 NOTE — Telephone Encounter (Signed)
Patients wife called office in reference to dropping records office from [facility] stay patient had.  Wife would like to know if the records/disk has been reviewed by Dr. Leta Baptist.  Please call

## 2017-03-11 DIAGNOSIS — R2681 Unsteadiness on feet: Secondary | ICD-10-CM | POA: Diagnosis not present

## 2017-03-11 DIAGNOSIS — R2689 Other abnormalities of gait and mobility: Secondary | ICD-10-CM | POA: Diagnosis not present

## 2017-03-11 DIAGNOSIS — R293 Abnormal posture: Secondary | ICD-10-CM | POA: Diagnosis not present

## 2017-03-11 DIAGNOSIS — M2560 Stiffness of unspecified joint, not elsewhere classified: Secondary | ICD-10-CM | POA: Diagnosis not present

## 2017-03-11 DIAGNOSIS — R278 Other lack of coordination: Secondary | ICD-10-CM | POA: Diagnosis not present

## 2017-03-11 DIAGNOSIS — M6281 Muscle weakness (generalized): Secondary | ICD-10-CM | POA: Diagnosis not present

## 2017-03-11 NOTE — Telephone Encounter (Signed)
I called pts wife and relayed that Dr. Leta Baptist looked at scan and recommended that we see pt in 4-6 wks.  I made appt 04-06-17 at 1000.  Pt has another spell of spinning and fell back into recliner on Saturday, then saw ENT on Tuesday and vertigo ruled out.  Has appt with Dr. Einar Gip on 03-17-17 and will bring DVD to him, will pick up on this day.   I will keep at my desk.  She verbalized understanding.

## 2017-03-11 NOTE — Telephone Encounter (Signed)
Patients wife is calling the office again to see if patients medical records have been reviewed from [facility].  Patient came to office and dropped them off.  Please call

## 2017-03-16 DIAGNOSIS — R2689 Other abnormalities of gait and mobility: Secondary | ICD-10-CM | POA: Diagnosis not present

## 2017-03-16 DIAGNOSIS — R2681 Unsteadiness on feet: Secondary | ICD-10-CM | POA: Diagnosis not present

## 2017-03-16 DIAGNOSIS — R293 Abnormal posture: Secondary | ICD-10-CM | POA: Diagnosis not present

## 2017-03-16 DIAGNOSIS — M6281 Muscle weakness (generalized): Secondary | ICD-10-CM | POA: Diagnosis not present

## 2017-03-16 DIAGNOSIS — M2560 Stiffness of unspecified joint, not elsewhere classified: Secondary | ICD-10-CM | POA: Diagnosis not present

## 2017-03-16 DIAGNOSIS — R278 Other lack of coordination: Secondary | ICD-10-CM | POA: Diagnosis not present

## 2017-03-17 DIAGNOSIS — Z8673 Personal history of transient ischemic attack (TIA), and cerebral infarction without residual deficits: Secondary | ICD-10-CM | POA: Diagnosis not present

## 2017-03-17 DIAGNOSIS — D469 Myelodysplastic syndrome, unspecified: Secondary | ICD-10-CM | POA: Diagnosis not present

## 2017-03-17 DIAGNOSIS — Z95818 Presence of other cardiac implants and grafts: Secondary | ICD-10-CM | POA: Diagnosis not present

## 2017-03-17 DIAGNOSIS — I2510 Atherosclerotic heart disease of native coronary artery without angina pectoris: Secondary | ICD-10-CM | POA: Diagnosis not present

## 2017-03-18 DIAGNOSIS — R278 Other lack of coordination: Secondary | ICD-10-CM | POA: Diagnosis not present

## 2017-03-18 DIAGNOSIS — M2560 Stiffness of unspecified joint, not elsewhere classified: Secondary | ICD-10-CM | POA: Diagnosis not present

## 2017-03-18 DIAGNOSIS — R2681 Unsteadiness on feet: Secondary | ICD-10-CM | POA: Diagnosis not present

## 2017-03-18 DIAGNOSIS — R2689 Other abnormalities of gait and mobility: Secondary | ICD-10-CM | POA: Diagnosis not present

## 2017-03-18 DIAGNOSIS — M6281 Muscle weakness (generalized): Secondary | ICD-10-CM | POA: Diagnosis not present

## 2017-03-18 DIAGNOSIS — R293 Abnormal posture: Secondary | ICD-10-CM | POA: Diagnosis not present

## 2017-03-23 DIAGNOSIS — R2681 Unsteadiness on feet: Secondary | ICD-10-CM | POA: Diagnosis not present

## 2017-03-23 DIAGNOSIS — M2560 Stiffness of unspecified joint, not elsewhere classified: Secondary | ICD-10-CM | POA: Diagnosis not present

## 2017-03-23 DIAGNOSIS — R2689 Other abnormalities of gait and mobility: Secondary | ICD-10-CM | POA: Diagnosis not present

## 2017-03-23 DIAGNOSIS — Z95810 Presence of automatic (implantable) cardiac defibrillator: Secondary | ICD-10-CM | POA: Diagnosis not present

## 2017-03-23 DIAGNOSIS — M6281 Muscle weakness (generalized): Secondary | ICD-10-CM | POA: Diagnosis not present

## 2017-03-23 DIAGNOSIS — R278 Other lack of coordination: Secondary | ICD-10-CM | POA: Diagnosis not present

## 2017-03-23 DIAGNOSIS — Z4502 Encounter for adjustment and management of automatic implantable cardiac defibrillator: Secondary | ICD-10-CM | POA: Diagnosis not present

## 2017-03-23 DIAGNOSIS — R293 Abnormal posture: Secondary | ICD-10-CM | POA: Diagnosis not present

## 2017-03-25 DIAGNOSIS — R2681 Unsteadiness on feet: Secondary | ICD-10-CM | POA: Diagnosis not present

## 2017-03-25 DIAGNOSIS — M6281 Muscle weakness (generalized): Secondary | ICD-10-CM | POA: Diagnosis not present

## 2017-03-25 DIAGNOSIS — R2689 Other abnormalities of gait and mobility: Secondary | ICD-10-CM | POA: Diagnosis not present

## 2017-03-25 DIAGNOSIS — R278 Other lack of coordination: Secondary | ICD-10-CM | POA: Diagnosis not present

## 2017-03-25 DIAGNOSIS — M2560 Stiffness of unspecified joint, not elsewhere classified: Secondary | ICD-10-CM | POA: Diagnosis not present

## 2017-03-25 DIAGNOSIS — R293 Abnormal posture: Secondary | ICD-10-CM | POA: Diagnosis not present

## 2017-03-30 DIAGNOSIS — R2689 Other abnormalities of gait and mobility: Secondary | ICD-10-CM | POA: Diagnosis not present

## 2017-03-30 DIAGNOSIS — M2560 Stiffness of unspecified joint, not elsewhere classified: Secondary | ICD-10-CM | POA: Diagnosis not present

## 2017-03-30 DIAGNOSIS — R2681 Unsteadiness on feet: Secondary | ICD-10-CM | POA: Diagnosis not present

## 2017-03-30 DIAGNOSIS — R278 Other lack of coordination: Secondary | ICD-10-CM | POA: Diagnosis not present

## 2017-03-30 DIAGNOSIS — M6281 Muscle weakness (generalized): Secondary | ICD-10-CM | POA: Diagnosis not present

## 2017-03-30 DIAGNOSIS — R293 Abnormal posture: Secondary | ICD-10-CM | POA: Diagnosis not present

## 2017-04-01 DIAGNOSIS — M6281 Muscle weakness (generalized): Secondary | ICD-10-CM | POA: Diagnosis not present

## 2017-04-01 DIAGNOSIS — R278 Other lack of coordination: Secondary | ICD-10-CM | POA: Diagnosis not present

## 2017-04-01 DIAGNOSIS — R2689 Other abnormalities of gait and mobility: Secondary | ICD-10-CM | POA: Diagnosis not present

## 2017-04-01 DIAGNOSIS — R2681 Unsteadiness on feet: Secondary | ICD-10-CM | POA: Diagnosis not present

## 2017-04-01 DIAGNOSIS — M2560 Stiffness of unspecified joint, not elsewhere classified: Secondary | ICD-10-CM | POA: Diagnosis not present

## 2017-04-01 DIAGNOSIS — R293 Abnormal posture: Secondary | ICD-10-CM | POA: Diagnosis not present

## 2017-04-05 DIAGNOSIS — R2689 Other abnormalities of gait and mobility: Secondary | ICD-10-CM | POA: Diagnosis not present

## 2017-04-05 DIAGNOSIS — M6281 Muscle weakness (generalized): Secondary | ICD-10-CM | POA: Diagnosis not present

## 2017-04-05 DIAGNOSIS — R2681 Unsteadiness on feet: Secondary | ICD-10-CM | POA: Diagnosis not present

## 2017-04-05 DIAGNOSIS — R293 Abnormal posture: Secondary | ICD-10-CM | POA: Diagnosis not present

## 2017-04-05 DIAGNOSIS — R278 Other lack of coordination: Secondary | ICD-10-CM | POA: Diagnosis not present

## 2017-04-05 DIAGNOSIS — M2560 Stiffness of unspecified joint, not elsewhere classified: Secondary | ICD-10-CM | POA: Diagnosis not present

## 2017-04-06 ENCOUNTER — Ambulatory Visit (INDEPENDENT_AMBULATORY_CARE_PROVIDER_SITE_OTHER): Payer: Medicare Other | Admitting: Diagnostic Neuroimaging

## 2017-04-06 ENCOUNTER — Encounter: Payer: Self-pay | Admitting: Diagnostic Neuroimaging

## 2017-04-06 ENCOUNTER — Telehealth: Payer: Self-pay | Admitting: *Deleted

## 2017-04-06 VITALS — BP 126/80 | HR 60

## 2017-04-06 DIAGNOSIS — R269 Unspecified abnormalities of gait and mobility: Secondary | ICD-10-CM

## 2017-04-06 DIAGNOSIS — I6503 Occlusion and stenosis of bilateral vertebral arteries: Secondary | ICD-10-CM | POA: Diagnosis not present

## 2017-04-06 DIAGNOSIS — R42 Dizziness and giddiness: Secondary | ICD-10-CM

## 2017-04-06 MED ORDER — CLOPIDOGREL BISULFATE 75 MG PO TABS: 75.0000 mg | ORAL_TABLET | Freq: Every day | ORAL | 4 refills | Status: DC

## 2017-04-06 NOTE — Patient Instructions (Signed)
GAIT DIFFICULTY (multifactorial due to multiple strokes in the past, lumbar radiculopathy, and peripheral neuropathy) - check B12 and A1c for neuropathy workup - continue PT exercises at home - caution with uneven and wet surfaces  VERTIGO (likely due to inner ear problem; less likely TIA) - resolved; stable  RECURRENT STROKES - continue medical management (BP control, statin) - change aspirin to plavix 75mg  daily for now  BILATERAL VERTEBRAL ARTERY STENOSES - recommend medical management for now - may consider right vertebal artery stenting in future

## 2017-04-06 NOTE — Telephone Encounter (Signed)
Wife spoke with this RN before patient left today and requested Plavix Rx be sent to local pharmacy. She stated it would take 10 days to get it from mail order. She stated she would reroute it to mail order later. This RN sent Plavix to local pharmacy as wife requested.

## 2017-04-06 NOTE — Progress Notes (Signed)
[facility]  PATIENT: Aaron Whitaker DOB: 09-21-1940  REFERRING CLINICIAN: Tobie Whitaker HISTORY FROM: patient and wife REASON FOR VISIT: follow up   HISTORICAL  CHIEF COMPLAINT:  Chief Complaint  Patient presents with  . Cerebrovascular Accident    rm 7, wife- Aaron Whitaker  . Follow-up    last seen 06/2015    HISTORY OF PRESENT ILLNESS:   UPDAET 04/06/17: Since last visit, went to ER on 01/28/17 for room spinning / dizzy sensation lasting 15 minutes when he got up out of bed and slid to the floor. Symptoms aggravated when he got back in to bed and laid down. Patient went to the emergency room and blood pressure was 213/99. Patient was admitted and had stroke workup. No acute process was identified. Stable vertebral artery stenoses were documented. Since that time patient is doing better. In addition patient has been diagnosed with a "bone marrow disorder".  UPDATE 06/03/15: Since last visit, had stroke in 03/31/15, with sudden speech difficulty, then found to have acute left frontal ischemic infarction. Switched to dual anti-platelet therapy x 3 months (will complete on 07/01/15), then planning for plavix 75mg  daily alone. Has heart monitor placed now.   UPDATE 11/13/14: Since last visit, has done well with PT. No more falls. Testing results reviewed. Overall doing well.  PRIOR HPI (09/14/14): 76 year old male with hypertension, hyperglycemia, coronary artery disease status post CABG, prostate cancer status post radiation therapy, stroke 2 affecting left leg and then left arm/hand strength, here for evaluation of gait and balance difficulty. Over past 10 years patient has had intermittent lower extremity pains from knees down to feet. Over time he has been having more problems with cramps in his calves especially if he exerts himself. Since July 2015 he has been having more balance problems. He has fallen down a time since then. Around Christmas 2015, patient was playing golf,  having trouble walking normally on the golf course. When he would start walking, his steps would speed up and then he would lose control. Patient has fallen towards the left side as well as backwards. No new tremor or shaking. No sudden onset focal neurologic symptoms. No new unilateral symptoms.    REVIEW OF SYSTEMS: Full 14 system review of systems performed and notable only for loss of vision.   ALLERGIES: No Known Allergies  HOME MEDICATIONS: Outpatient Medications Prior to Visit  Medication Sig Dispense Refill  . aspirin 81 MG chewable tablet Chew 1 tablet (81 mg total) by mouth daily. 30 tablet 0  . Bioflavonoid Products (ESTER C PO) Take 1,000 mg by mouth daily.    . carvedilol (COREG) 3.125 MG tablet Take 3.125 mg by mouth 2 (two) times daily with a meal.     . fluticasone (FLONASE) 50 MCG/ACT nasal spray Place 2 sprays into both nostrils 2 (two) times daily.     . Multiple Vitamin (MULTIVITAMIN) capsule Take 1 capsule by mouth daily.    . Multiple Vitamins-Minerals (PRESERVISION AREDS 2 PO) Take by mouth 2 (two) times daily before a meal.    . Omega-3 Fatty Acids (FISH OIL) 1200 MG CAPS Take 1,200 mg by mouth daily.    . diphenhydrAMINE (BENADRYL) 25 MG tablet Take 25 mg by mouth daily as needed for allergies.     Marland Kitchen clopidogrel (PLAVIX) 75 MG tablet TAKE 1 TABLET EVERY DAY 90 tablet 3   No facility-administered medications prior to visit.     PAST MEDICAL HISTORY: Past Medical History:  Diagnosis Date  .  Arthritis   . Blood transfusion   . Heart disease   . Hx of CABG   . Hyperlipidemia   . Hypertension   . Hyperthyroidism   . Lower GI bleeding 1987   "bleeding ulcers; got 6 pints of blood"  . Macular degeneration    bilateral  . MI (myocardial infarction) ([place]) 03/2006  . Pernicious anemia   . Prostate cancer ([place]) ~2005   S/P radiation; seed implants  . Skin cancer   . Stroke [facility]) 2007; 12/2010, 03/2015   "light"; denies residuals, July 31,2016 most recent  .  Ulcerative colitis     PAST SURGICAL HISTORY: Past Surgical History:  Procedure Laterality Date  . CARDIAC CATHETERIZATION N/A 09/10/2015   Procedure: ASD/VSD Closure;  Surgeon: [name], MD;  Location: [facility];  Service: Cardiovascular;  Laterality: N/A;  . CORONARY ARTERY BYPASS GRAFT  02/2006   CABG X 5  . EP IMPLANTABLE DEVICE N/A 04/03/2015   Procedure: Loop Recorder Insertion;  Surgeon: Evans Lance, MD;  Location: [facility];  Service: Cardiovascular;  Laterality: N/A;  . GASTRIC RESECTION  ~ 1987   "had ~ 90% of my stomach removed"  . 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 KNEE ARTHROSCOPY     bilaterally; "long long time ago" (11/17/11)  . PATENT FORAMEN OVALE CLOSURE  03/2015  . PATENT FORAMEN OVALE CLOSURE N/A 11/17/2011   Procedure: PATENT FORAMEN OVALE CLOSURE;  Surgeon: Laverda Page, MD;  Location: [facility] C[facility] LAB;  Service: Cardiovascular;  Laterality: N/A;  . TEE WITHOUT CARDIOVERSION N/A 04/03/2015   Procedure: TRANSESOPHAGEAL ECHOCARDIOGRAM (TEE);  Surgeon: Lelon Perla, MD;  Location: [facility] ENDOSCOPY;  Service: Cardiovascular;  Laterality: N/A;    FAMILY HISTORY: Family History  Problem Relation Age of Onset  . Cancer Mother   . Other Father        house fire  . Alzheimer's disease Sister   . Prostate cancer Brother     SOCIAL HISTORY:  Social History   Social History  . Marital status: Married    Spouse name: Aaron Whitaker  . Number of children: 0  . Years of education: 9th   Occupational History  . retired Other   Social History Main Topics  . Smoking status: Former Smoker    Packs/day: 0.50    Years: 8.00    Quit date: 08/31/1965  . Smokeless tobacco: Current User    Types: Chew     Comment: 2 pouches daily  . Alcohol use No  . Drug use: No  . Sexual activity: No   Other Topics Concern  . Not on file   Social History Narrative   Pt lives at home with spouse Aaron Whitaker.   Retired.   9th grade.   Right handed.   Caffeine  None      PHYSICAL EXAM  Vitals:   04/06/17 1038  BP: 126/80  Pulse: 60    There is no height or weight on file to calculate BMI.  No exam data present  No flowsheet data found.  GENERAL EXAM: Patient is in no distress; well developed, nourished and groomed; neck is supple  CARDIOVASCULAR: Regular rate and rhythm, no murmurs, no carotid bruits  NEUROLOGIC: MENTAL STATUS: awake, alert, SLIGHTLY DECR FLUENCY; comprehension intact, naming intact, fund of knowledge appropriate; POSITIVE SNOUT; NEG MYERSONS CRANIAL NERVE: pupils equal and reactive to light, visual fields full to confrontation, extraocular muscles intact, no nystagmus, facial sensation symmetric, hearing  intact, palate elevates symmetrically, uvula midline, shoulder shrug symmetric, tongue midline; SLURRED SPEECH; NASAL SPEECH MOTOR: normal bulk and tone, full strength EXCEPT BLE (HF 4+, OTHERWISE 5); DECR FINGER TAP AND FOOT TAP ON LEFT SIDE SENSORY: DECR VIB IN HANDS; ABSENT VIB AT TOES. DECR PP IN FEET AND LEGS. DECR TEMP IN HANDS AND FEET COORDINATION: finger-nose-finger, fine finger movements normal REFLEXES: BUE 1, KNEES 1, ANKLES 0 GAIT/STATION: SLOW, CAREFUL GAIT; STOOP POSTURE; SLOW TURNS     DIAGNOSTIC DATA (LABS, IMAGING, TESTING) - I reviewed patient records, labs, notes, testing and imaging myself where available.  Lab Results  Component Value Date   WBC 7.4 04/03/2015   HGB 14.0 04/03/2015   HCT 41.4 04/03/2015   MCV 95.4 04/03/2015   PLT 223 04/03/2015      Component Value Date/Time   NA 141 03/31/2015 1043   NA 142 11/02/2011 0810   K 4.1 03/31/2015 1043   K 4.5 11/02/2011 0810   CL 107 03/31/2015 1043   CL 106 11/02/2011 0810   CO2 22 03/31/2015 1030   CO2 27 11/02/2011 0810   GLUCOSE 103 (H) 03/31/2015 1043   GLUCOSE 101 11/02/2011 0810   BUN 30 (H) 03/31/2015 1043   BUN 20 11/02/2011 0810   CREATININE 1.24 04/03/2015 1201   CREATININE 1.1 11/02/2011 0810   CALCIUM 8.8 (L)  03/31/2015 1030   CALCIUM 8.5 11/02/2011 0810   PROT 6.4 (L) 03/31/2015 1030   PROT 7.6 11/02/2011 0810   ALBUMIN 3.6 03/31/2015 1030   ALBUMIN 3.7 11/02/2011 0810   AST 20 03/31/2015 1030   AST 23 11/02/2011 0810   ALT 20 03/31/2015 1030   ALT 16 11/02/2011 0810   ALKPHOS 97 03/31/2015 1030   ALKPHOS 62 11/02/2011 0810   BILITOT 0.7 03/31/2015 1030   BILITOT 0.50 11/02/2011 0810   GFRNONAA 56 (L) 04/03/2015 1201   GFRAA >60 04/03/2015 1201   Lab Results  Component Value Date   CHOL 147 04/01/2015   HDL 29 (L) 04/01/2015   LDLCALC 88 04/01/2015   TRIG 149 04/01/2015   CHOLHDL 5.1 04/01/2015   Lab Results  Component Value Date   HGBA1C 6.1 (H) 04/01/2015   Lab Results  Component Value Date   VITAMINB12 469 09/14/2014   Lab Results  Component Value Date   TSH 3.240 09/14/2014     01/17/11 MRI brain - Acute right hemisphere cortical infarct affecting the motor strip. Left hand weakness would be consistent with the observed location. Atrophy and small vessel disease.  01/17/11 MRA head - Widely patent carotid and basilar arteries. Right vertebral is the dominant contributor to the basilar. Left vertebral is severely diseased with a high-grade 75-90% stenosis distally. No proximal stenosis of the ACA, MCA, or PCA vessels. Mild irregularity distal MCA and PCA vessels suggests intracranial atherosclerotic disease.  IMPRESSION: Intracranial atherosclerotic change as described.  09/28/14 MRI brain (without) demonstrating: 1. Acute, punctate, left frontal periventricular ischemic infarction.  2. Moderate periventricular, subcortical and pontine chronic small vessel ischemic disease.  3. Compared to MRI on 01/17/11, there has been progression of chronic small vessel ischemic disease, and the punctate acute infarction is a new finding.  09/28/14 MRI lumbar spine (without) demonstrating: 1. At L5-S1: disc bulging and facet hypertrophy with mild-moderate right and severe left  foraminal stenosis  2. At L4-5: disc bulging and facet hypertrophy with moderate biforaminal stenosis  3. At L2-3, L3-4: disc bulging and facet hypertrophy with mild biforaminal stenosis  4. Scoliosis convex to  the left centered at L3.  10/18/14 CAROTID - no ICA stenosis; mild plaque in left bulb  10/18/14 TTE - Low normal LV function; grade 1 diastolic dysfunction; sclerotic aortic valve with trace AI; closure device noted on atrial septum with no residual shunt.  03/31/15 MRI brain 1. 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 2. Otherwise stable diffuse atrophy and white matter disease compatible with chronic microvascular ischemia.  03/31/15 MRA head  1. Stable fusiform dilation of the high cervical right internal carotid artery. 2. 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 3. Moderate diffuse small vessel disease.  01/29/17 MRA head [I reviewed images myself and agree with interpretation. -VRP]  1. Stable compared to 2016. 2. High-grade stenosis of the non dominant left vertebral artery below the vertebrobasilar junction. 3. Mild right M1 segment stenosis.  01/29/17 MRA neck [I reviewed images myself and agree with interpretation. -VRP]  1. 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 2. No stenosis in the carotid circulation bilaterally. 3. High-grade stenosis at the origin of the dominant right vertebral artery.  01/29/17 MRI brain [I reviewed images myself and agree with interpretation. -VRP]  1. No acute intracranial process identified. 2. Scattered remote left cerebellar infarcts, stable from previous. 3. Moderate cerebral atrophy with chronic small vessel ischemic disease, mildly progressed relative to 2016.     ASSESSMENT AND PLAN  76 y.o. year old male here with progressive gait and balance difficulty, neurogenic  claudication, peripheral neuropathy symptoms, history of strokes. Likely represents progressive cerebral small vessel ischemic disease with underlying lumbar radiculopathy/peripheral neuropathy processes.    Dx:  1. Gait difficulty   2. Stenosis of both vertebral arteries   3. Dizziness      PLAN:  I spent 40 minutes of face to face time with patient. Greater than 50% of time was spent in counseling and coordination of care with patient. In summary we discussed:   GAIT DIFFICULTY (multifactorial due to multiple strokes in the past, lumbar radiculopathy, and peripheral neuropathy) - check B12 and A1c for neuropathy workup - continue PT exercises at home - caution with uneven and wet surfaces  VERTIGO (likely due to peripheral vestibulopathy; less likely vertebro-basilar insufficiency as patient had isolated vertigo in absence of other posterior circulation symptoms) - resolved; stable  RECURRENT STROKES - continue medical mgmt (BP control, statin)  BILATERAL VERTEBRAL ARTERY STENOSES - recommend medical management for now (because degree of stenosis may be overestimated by MRA, there is normal-appearing blood flow throughout the basilar artery, and patient's vertigo symptoms likely due to peripheral cause rather than central) - change aspirin to plavix 75mg  daily for now - if patient has recurrent posterior circulation symptoms, then may consider cerebral angiogram to better visualize the vasculature and then may consider right vertebal artery origin stenting   Meds ordered this encounter  Medications  . clopidogrel (PLAVIX) 75 MG tablet    Sig: Take 1 tablet (75 mg total) by mouth daily.    Dispense:  90 tablet    Refill:  4   Orders Placed This Encounter  Procedures  . Hemoglobin A1c  . Vitamin B12   Return in about 4 months (around 08/06/2017).     Penni Bombard, MD 0/01/3015, 01:09 AM Certified in Neurology, Neurophysiology and Neuroimaging  [facility] [address], [place], [place] [address] [phone]

## 2017-04-07 LAB — HEMOGLOBIN A1C
ESTIMATED AVERAGE GLUCOSE: 134 mg/dL
HEMOGLOBIN A1C: 6.3 % — AB (ref 4.8–5.6)

## 2017-04-07 LAB — VITAMIN B12: Vitamin B-12: 538 pg/mL (ref 232–1245)

## 2017-04-08 ENCOUNTER — Telehealth: Payer: Self-pay | Admitting: *Deleted

## 2017-04-08 NOTE — Telephone Encounter (Signed)
Patient was not available; spoke with wife, on DPR and informed her his labs showed his A1c is gradually worsening, but not major. Advised he follow up with PCP routinely.  Informed her his vitamin B12 is normal. Advised Dr Leta Baptist will continue with his current plan.  Reviewed the plan per Dr Gladstone Lighter note and advised he continue with FU with PCP. She stated he has an appointment soon, and they will discuss A1c with PCP. She verbalized understanding, appreciation of call.

## 2017-04-16 DIAGNOSIS — D649 Anemia, unspecified: Secondary | ICD-10-CM | POA: Diagnosis not present

## 2017-04-22 DIAGNOSIS — Z4509 Encounter for adjustment and management of other cardiac device: Secondary | ICD-10-CM | POA: Diagnosis not present

## 2017-04-22 DIAGNOSIS — Z8673 Personal history of transient ischemic attack (TIA), and cerebral infarction without residual deficits: Secondary | ICD-10-CM | POA: Diagnosis not present

## 2017-04-22 DIAGNOSIS — Z95818 Presence of other cardiac implants and grafts: Secondary | ICD-10-CM | POA: Diagnosis not present

## 2017-05-18 DIAGNOSIS — D469 Myelodysplastic syndrome, unspecified: Secondary | ICD-10-CM | POA: Diagnosis not present

## 2017-05-18 DIAGNOSIS — R531 Weakness: Secondary | ICD-10-CM | POA: Diagnosis not present

## 2017-05-22 DIAGNOSIS — Z8673 Personal history of transient ischemic attack (TIA), and cerebral infarction without residual deficits: Secondary | ICD-10-CM | POA: Diagnosis not present

## 2017-05-22 DIAGNOSIS — Z4509 Encounter for adjustment and management of other cardiac device: Secondary | ICD-10-CM | POA: Diagnosis not present

## 2017-05-22 DIAGNOSIS — Z95818 Presence of other cardiac implants and grafts: Secondary | ICD-10-CM | POA: Diagnosis not present

## 2017-05-31 DIAGNOSIS — I2510 Atherosclerotic heart disease of native coronary artery without angina pectoris: Secondary | ICD-10-CM | POA: Diagnosis not present

## 2017-05-31 DIAGNOSIS — M25561 Pain in right knee: Secondary | ICD-10-CM | POA: Diagnosis not present

## 2017-05-31 DIAGNOSIS — K219 Gastro-esophageal reflux disease without esophagitis: Secondary | ICD-10-CM | POA: Diagnosis not present

## 2017-05-31 DIAGNOSIS — I10 Essential (primary) hypertension: Secondary | ICD-10-CM | POA: Diagnosis not present

## 2017-05-31 DIAGNOSIS — E538 Deficiency of other specified B group vitamins: Secondary | ICD-10-CM | POA: Diagnosis not present

## 2017-05-31 DIAGNOSIS — E7800 Pure hypercholesterolemia, unspecified: Secondary | ICD-10-CM | POA: Diagnosis not present

## 2017-05-31 DIAGNOSIS — R269 Unspecified abnormalities of gait and mobility: Secondary | ICD-10-CM | POA: Diagnosis not present

## 2017-05-31 DIAGNOSIS — I69359 Hemiplegia and hemiparesis following cerebral infarction affecting unspecified side: Secondary | ICD-10-CM | POA: Diagnosis not present

## 2017-06-06 DIAGNOSIS — Z23 Encounter for immunization: Secondary | ICD-10-CM | POA: Diagnosis not present

## 2017-06-09 DIAGNOSIS — M1712 Unilateral primary osteoarthritis, left knee: Secondary | ICD-10-CM | POA: Diagnosis not present

## 2017-06-09 DIAGNOSIS — M85861 Other specified disorders of bone density and structure, right lower leg: Secondary | ICD-10-CM | POA: Diagnosis not present

## 2017-06-09 DIAGNOSIS — M179 Osteoarthritis of knee, unspecified: Secondary | ICD-10-CM | POA: Diagnosis not present

## 2017-06-09 DIAGNOSIS — M25561 Pain in right knee: Secondary | ICD-10-CM | POA: Diagnosis not present

## 2017-06-21 DIAGNOSIS — Z95818 Presence of other cardiac implants and grafts: Secondary | ICD-10-CM | POA: Diagnosis not present

## 2017-06-21 DIAGNOSIS — Z4509 Encounter for adjustment and management of other cardiac device: Secondary | ICD-10-CM | POA: Diagnosis not present

## 2017-06-21 DIAGNOSIS — I639 Cerebral infarction, unspecified: Secondary | ICD-10-CM | POA: Diagnosis not present

## 2017-06-22 ENCOUNTER — Ambulatory Visit: Payer: Medicare Other | Admitting: Diagnostic Neuroimaging

## 2017-07-05 DIAGNOSIS — N183 Chronic kidney disease, stage 3 (moderate): Secondary | ICD-10-CM | POA: Diagnosis not present

## 2017-07-05 DIAGNOSIS — Z23 Encounter for immunization: Secondary | ICD-10-CM | POA: Diagnosis not present

## 2017-07-05 DIAGNOSIS — Z0000 Encounter for general adult medical examination without abnormal findings: Secondary | ICD-10-CM | POA: Diagnosis not present

## 2017-07-05 DIAGNOSIS — Z136 Encounter for screening for cardiovascular disorders: Secondary | ICD-10-CM | POA: Diagnosis not present

## 2017-07-05 DIAGNOSIS — E785 Hyperlipidemia, unspecified: Secondary | ICD-10-CM | POA: Diagnosis not present

## 2017-07-05 DIAGNOSIS — Z1331 Encounter for screening for depression: Secondary | ICD-10-CM | POA: Diagnosis not present

## 2017-07-05 DIAGNOSIS — Z125 Encounter for screening for malignant neoplasm of prostate: Secondary | ICD-10-CM | POA: Diagnosis not present

## 2017-07-20 DIAGNOSIS — D649 Anemia, unspecified: Secondary | ICD-10-CM | POA: Diagnosis not present

## 2017-07-21 DIAGNOSIS — Z95818 Presence of other cardiac implants and grafts: Secondary | ICD-10-CM | POA: Diagnosis not present

## 2017-07-21 DIAGNOSIS — Z4509 Encounter for adjustment and management of other cardiac device: Secondary | ICD-10-CM | POA: Diagnosis not present

## 2017-07-21 DIAGNOSIS — I639 Cerebral infarction, unspecified: Secondary | ICD-10-CM | POA: Diagnosis not present

## 2017-08-04 ENCOUNTER — Ambulatory Visit (INDEPENDENT_AMBULATORY_CARE_PROVIDER_SITE_OTHER): Payer: Medicare Other | Admitting: Diagnostic Neuroimaging

## 2017-08-04 ENCOUNTER — Encounter: Payer: Self-pay | Admitting: Diagnostic Neuroimaging

## 2017-08-04 VITALS — BP 125/70 | HR 66 | Wt 188.4 lb

## 2017-08-04 DIAGNOSIS — R269 Unspecified abnormalities of gait and mobility: Secondary | ICD-10-CM

## 2017-08-04 DIAGNOSIS — I6503 Occlusion and stenosis of bilateral vertebral arteries: Secondary | ICD-10-CM | POA: Diagnosis not present

## 2017-08-04 DIAGNOSIS — R42 Dizziness and giddiness: Secondary | ICD-10-CM

## 2017-08-04 NOTE — Progress Notes (Signed)
[facility]  PATIENT: Aaron Whitaker DOB: Jan 13, 1941  REFERRING CLINICIAN: Tobie Lords HISTORY FROM: patient and wife REASON FOR VISIT: follow up   HISTORICAL  CHIEF COMPLAINT:  Chief Complaint  Patient presents with  . Gait Problem    rm 6, wife- Aaron Whitaker, "doing much better, no falling, played golf twice"  . Follow-up    4 month    HISTORY OF PRESENT ILLNESS:   UPDATE (08/04/17, VRP): Since last visit, doing well. Tolerating meds. No alleviating or aggravating factors. Overall balance and gait are better.   UPDAET 04/06/17: Since last visit, went to ER on 01/28/17 for room spinning / dizzy sensation lasting 15 minutes when he got up out of bed and slid to the floor. Symptoms aggravated when he got back in to bed and laid down. Patient went to the emergency room and blood pressure was 213/99. Patient was admitted and had stroke workup. No acute process was identified. Stable vertebral artery stenoses were documented. Since that time patient is doing better. In addition patient has been diagnosed with a "bone marrow disorder".  UPDATE 06/03/15: Since last visit, had stroke in 03/31/15, with sudden speech difficulty, then found to have acute left frontal ischemic infarction. Switched to dual anti-platelet therapy x 3 months (will complete on 07/01/15), then planning for plavix 75mg  daily alone. Has heart monitor placed now.   UPDATE 11/13/14: Since last visit, has done well with PT. No more falls. Testing results reviewed. Overall doing well.  PRIOR HPI (09/14/14): 76 year old male with hypertension, hyperglycemia, coronary artery disease status post CABG, prostate cancer status post radiation therapy, stroke 2 affecting left leg and then left arm/hand strength, here for evaluation of gait and balance difficulty. Over past 10 years patient has had intermittent lower extremity pains from knees down to feet. Over time he has been having more problems with cramps in his calves  especially if he exerts himself. Since July 2015 he has been having more balance problems. He has fallen down a time since then. Around Christmas 2015, patient was playing golf, having trouble walking normally on the golf course. When he would start walking, his steps would speed up and then he would lose control. Patient has fallen towards the left side as well as backwards. No new tremor or shaking. No sudden onset focal neurologic symptoms. No new unilateral symptoms.    REVIEW OF SYSTEMS: Full 14 system review of systems performed and notable only for loss of vision.   ALLERGIES: No Known Allergies  HOME MEDICATIONS: Outpatient Medications Prior to Visit  Medication Sig Dispense Refill  . amLODipine (NORVASC) 5 MG tablet Take 5 mg by mouth daily.    Marland Kitchen Bioflavonoid Products (ESTER C PO) Take 1,000 mg by mouth daily.    . carvedilol (COREG) 3.125 MG tablet Take 3.125 mg by mouth 2 (two) times daily with a meal.     . clopidogrel (PLAVIX) 75 MG tablet Take 1 tablet (75 mg total) by mouth daily. 90 tablet 4  . diphenhydrAMINE (BENADRYL) 25 MG tablet Take 25 mg by mouth daily as needed for allergies.     . fluticasone (FLONASE) 50 MCG/ACT nasal spray Place 2 sprays into both nostrils 2 (two) times daily.     . Multiple Vitamin (MULTIVITAMIN) capsule Take 1 capsule by mouth daily.    . Multiple Vitamins-Minerals (PRESERVISION AREDS 2 PO) Take by mouth 2 (two) times daily before a meal.    . rosuvastatin (CRESTOR) 40 MG tablet Take 40 mg by  mouth daily.    . Omega-3 Fatty Acids (FISH OIL) 1200 MG CAPS Take 1,200 mg by mouth daily.     No facility-administered medications prior to visit.     PAST MEDICAL HISTORY: Past Medical History:  Diagnosis Date  . Arthritis   . Blood transfusion   . Heart disease   . Hx of CABG   . Hyperlipidemia   . Hypertension   . Hyperthyroidism   . Lower GI bleeding 1987   "bleeding ulcers; got 6 pints of blood"  . Macular degeneration    bilateral  .  MI (myocardial infarction) ([place]) 03/2006  . Pernicious anemia   . Prostate cancer ([place]) ~2005   S/P radiation; seed implants  . Skin cancer   . Stroke [facility]) 2007; 12/2010, 03/2015   "light"; denies residuals, July 31,2016 most recent  . Ulcerative colitis     PAST SURGICAL HISTORY: Past Surgical History:  Procedure Laterality Date  . CARDIAC CATHETERIZATION N/A 09/10/2015   Procedure: ASD/VSD Closure;  Surgeon: [name], MD;  Location: [place] [facility];  Service: Cardiovascular;  Laterality: N/A;  . CORONARY ARTERY BYPASS GRAFT  02/2006   CABG X 5  . EP IMPLANTABLE DEVICE N/A 04/03/2015   Procedure: Loop Recorder Insertion;  Surgeon: Evans Lance, MD;  Location: [place] [facility];  Service: Cardiovascular;  Laterality: N/A;  . GASTRIC RESECTION  ~ 1987   "had ~ 90% of my stomach removed"  . 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 KNEE ARTHROSCOPY     bilaterally; "long long time ago" (11/17/11)  . PATENT FORAMEN OVALE CLOSURE  03/2015  . PATENT FORAMEN OVALE CLOSURE N/A 11/17/2011   Procedure: PATENT FORAMEN OVALE CLOSURE;  Surgeon: Laverda Page, MD;  Location: [facility] CATH LAB;  Service: Cardiovascular;  Laterality: N/A;  . TEE WITHOUT CARDIOVERSION N/A 04/03/2015   Procedure: TRANSESOPHAGEAL ECHOCARDIOGRAM (TEE);  Surgeon: Lelon Perla, MD;  Location: [facility] ENDOSCOPY;  Service: Cardiovascular;  Laterality: N/A;    FAMILY HISTORY: Family History  Problem Relation Age of Onset  . Cancer Mother   . Other Father        house fire  . Alzheimer's disease Sister   . Prostate cancer Brother     SOCIAL HISTORY:  Social History   Socioeconomic History  . Marital status: Married    Spouse name: Aaron Whitaker  . Number of children: 0  . Years of education: 9th  . Highest education level: Not on file  Social Needs  . Financial resource strain: Not on file  . Food insecurity - worry: Not on file  . Food insecurity - inability: Not on file  . Transportation needs - medical: Not  on file  . Transportation needs - non-medical: Not on file  Occupational History  . Occupation: retired    Fish farm manager: OTHER  Tobacco Use  . Smoking status: Former Smoker    Packs/day: 0.50    Years: 8.00    Pack years: 4.00    Last attempt to quit: 08/31/1965    Years since quitting: 51.9  . Smokeless tobacco: Current User    Types: Chew  . Tobacco comment: 2 pouches daily  Substance and Sexual Activity  . Alcohol use: No    Alcohol/week: 0.0 oz  . Drug use: No  . Sexual activity: No    Birth control/protection: Abstinence  Other Topics Concern  . Not on file  Social History Narrative   Pt lives at home with  spouse LIZ.   Retired.   9th grade.   Right handed.   Caffeine None      PHYSICAL EXAM  Vitals:   08/04/17 1320  BP: 125/70  Pulse: 66  Weight: 188 lb 6.4 oz (85.5 kg)    Body mass index is 27.82 kg/m.  No exam data present  No flowsheet data found.  GENERAL EXAM: Patient is in no distress; well developed, nourished and groomed; neck is supple  CARDIOVASCULAR: Regular rate and rhythm, no murmurs, no carotid bruits  NEUROLOGIC: MENTAL STATUS: awake, alert, SLIGHTLY DECR FLUENCY; comprehension intact, naming intact, fund of knowledge appropriate; POSITIVE SNOUT; NEG MYERSONS CRANIAL NERVE: pupils equal and reactive to light, visual fields full to confrontation, extraocular muscles intact, no nystagmus, facial sensation symmetric, hearing intact, palate elevates symmetrically, uvula midline, shoulder shrug symmetric, tongue midline; SLURRED SPEECH; NASAL SPEECH MOTOR: normal bulk and tone, full strength EXCEPT BLE (HF 4+, OTHERWISE 5); DECR FINGER TAP AND FOOT TAP ON LEFT SIDE SENSORY: DECR VIB IN HANDS; ABSENT VIB AT TOES. DECR PP IN FEET AND LEGS. DECR TEMP IN HANDS AND FEET COORDINATION: finger-nose-finger, fine finger movements normal REFLEXES: BUE 1, KNEES 1, ANKLES 0 GAIT/STATION: SLOW, CAREFUL GAIT; STOOP POSTURE; SLOW TURNS     DIAGNOSTIC DATA  (LABS, IMAGING, TESTING) - I reviewed patient records, labs, notes, testing and imaging myself where available.  Lab Results  Component Value Date   WBC 7.4 04/03/2015   HGB 14.0 04/03/2015   HCT 41.4 04/03/2015   MCV 95.4 04/03/2015   PLT 223 04/03/2015      Component Value Date/Time   NA 141 03/31/2015 1043   NA 142 11/02/2011 0810   K 4.1 03/31/2015 1043   K 4.5 11/02/2011 0810   CL 107 03/31/2015 1043   CL 106 11/02/2011 0810   CO2 22 03/31/2015 1030   CO2 27 11/02/2011 0810   GLUCOSE 103 (H) 03/31/2015 1043   GLUCOSE 101 11/02/2011 0810   BUN 30 (H) 03/31/2015 1043   BUN 20 11/02/2011 0810   CREATININE 1.24 04/03/2015 1201   CREATININE 1.1 11/02/2011 0810   CALCIUM 8.8 (L) 03/31/2015 1030   CALCIUM 8.5 11/02/2011 0810   PROT 6.4 (L) 03/31/2015 1030   PROT 7.6 11/02/2011 0810   ALBUMIN 3.6 03/31/2015 1030   ALBUMIN 3.7 11/02/2011 0810   AST 20 03/31/2015 1030   AST 23 11/02/2011 0810   ALT 20 03/31/2015 1030   ALT 16 11/02/2011 0810   ALKPHOS 97 03/31/2015 1030   ALKPHOS 62 11/02/2011 0810   BILITOT 0.7 03/31/2015 1030   BILITOT 0.50 11/02/2011 0810   GFRNONAA 56 (L) 04/03/2015 1201   GFRAA >60 04/03/2015 1201   Lab Results  Component Value Date   CHOL 147 04/01/2015   HDL 29 (L) 04/01/2015   LDLCALC 88 04/01/2015   TRIG 149 04/01/2015   CHOLHDL 5.1 04/01/2015   Lab Results  Component Value Date   HGBA1C 6.3 (H) 04/06/2017   Lab Results  Component Value Date   HQIONGEX52 841 04/06/2017   Lab Results  Component Value Date   TSH 3.240 09/14/2014     01/17/11 MRI brain - Acute right hemisphere cortical infarct affecting the motor strip. Left hand weakness would be consistent with the observed location. Atrophy and small vessel disease.  01/17/11 MRA head - Widely patent carotid and basilar arteries. Right vertebral is the dominant contributor to the basilar. Left vertebral is severely diseased with a high-grade 75-90% stenosis distally. No proximal  stenosis of the ACA, MCA, or PCA vessels. Mild irregularity distal MCA and PCA vessels suggests intracranial atherosclerotic disease.  IMPRESSION: Intracranial atherosclerotic change as described.  09/28/14 MRI brain (without) demonstrating: 1. Acute, punctate, left frontal periventricular ischemic infarction.  2. Moderate periventricular, subcortical and pontine chronic small vessel ischemic disease.  3. Compared to MRI on 01/17/11, there has been progression of chronic small vessel ischemic disease, and the punctate acute infarction is a new finding.  09/28/14 MRI lumbar spine (without) demonstrating: 1. At L5-S1: disc bulging and facet hypertrophy with mild-moderate right and severe left foraminal stenosis  2. At L4-5: disc bulging and facet hypertrophy with moderate biforaminal stenosis  3. At L2-3, L3-4: disc bulging and facet hypertrophy with mild biforaminal stenosis  4. Scoliosis convex to the left centered at L3.  10/18/14 CAROTID - no ICA stenosis; mild plaque in left bulb  10/18/14 TTE - Low normal LV function; grade 1 diastolic dysfunction; sclerotic aortic valve with trace AI; closure device noted on atrial septum with no residual shunt.  03/31/15 MRI brain 1. 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 2. Otherwise stable diffuse atrophy and white matter disease compatible with chronic microvascular ischemia.  03/31/15 MRA head  1. Stable fusiform dilation of the high cervical right internal carotid artery. 2. 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 3. Moderate diffuse small vessel disease.  01/29/17 MRA head [I reviewed images myself and agree with interpretation. -VRP]  1. Stable compared to 2016. 2. High-grade stenosis of the non dominant left vertebral artery below the vertebrobasilar junction. 3. Mild right M1 segment stenosis.  01/29/17 MRA neck [I  reviewed images myself and agree with interpretation. -VRP]  1. 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 2. No stenosis in the carotid circulation bilaterally. 3. High-grade stenosis at the origin of the dominant right vertebral artery.  01/29/17 MRI brain [I reviewed images myself and agree with interpretation. -VRP]  1. No acute intracranial process identified. 2. Scattered remote left cerebellar infarcts, stable from previous. 3. Moderate cerebral atrophy with chronic small vessel ischemic disease, mildly progressed relative to 2016.     ASSESSMENT AND PLAN  76 y.o. year old male here with progressive gait and balance difficulty, neurogenic claudication, peripheral neuropathy symptoms, history of strokes. Likely represents progressive cerebral small vessel ischemic disease with underlying lumbar radiculopathy/peripheral neuropathy processes.    Dx:  1. Gait difficulty   2. Stenosis of both vertebral arteries   3. Dizziness      PLAN:  GAIT DIFFICULTY (multifactorial due to multiple strokes in the past, lumbar radiculopathy, and peripheral neuropathy) - continue PT exercises at home - caution with uneven and wet surfaces  VERTIGO (likely due to peripheral vestibulopathy; less likely vertebro-basilar insufficiency as patient had isolated vertigo in absence of other posterior circulation symptoms) - resolved; stable  RECURRENT STROKES - continue medical mgmt (BP control, statin)  BILATERAL VERTEBRAL ARTERY STENOSES - recommend medical management for now (because degree of stenosis may be overestimated by MRA, there is normal-appearing blood flow throughout the basilar artery, and patient's vertigo symptoms likely due to peripheral cause rather than central) - change aspirin to plavix 75mg  daily for now - if patient has recurrent posterior circulation symptoms, then may consider cerebral angiogram to better visualize the vasculature and  then may consider right vertebal artery origin stenting  Return if symptoms worsen or fail  to improve, for return to PCP.     Penni Bombard, MD 07/06/5789, 3:83 PM Certified in Neurology, Neurophysiology and Neuroimaging  [facility] [address] [place], [place] [address] [phone]

## 2017-08-20 DIAGNOSIS — Z95818 Presence of other cardiac implants and grafts: Secondary | ICD-10-CM | POA: Diagnosis not present

## 2017-08-20 DIAGNOSIS — Z8673 Personal history of transient ischemic attack (TIA), and cerebral infarction without residual deficits: Secondary | ICD-10-CM | POA: Diagnosis not present

## 2017-08-20 DIAGNOSIS — Z4509 Encounter for adjustment and management of other cardiac device: Secondary | ICD-10-CM | POA: Diagnosis not present

## 2017-09-16 DIAGNOSIS — D469 Myelodysplastic syndrome, unspecified: Secondary | ICD-10-CM | POA: Diagnosis not present

## 2017-09-19 DIAGNOSIS — Z95818 Presence of other cardiac implants and grafts: Secondary | ICD-10-CM | POA: Diagnosis not present

## 2017-09-19 DIAGNOSIS — I639 Cerebral infarction, unspecified: Secondary | ICD-10-CM | POA: Diagnosis not present

## 2017-09-19 DIAGNOSIS — Z4509 Encounter for adjustment and management of other cardiac device: Secondary | ICD-10-CM | POA: Diagnosis not present

## 2017-10-19 DIAGNOSIS — H2703 Aphakia, bilateral: Secondary | ICD-10-CM | POA: Diagnosis not present

## 2017-10-19 DIAGNOSIS — H3530 Unspecified macular degeneration: Secondary | ICD-10-CM | POA: Diagnosis not present

## 2017-11-03 DIAGNOSIS — E538 Deficiency of other specified B group vitamins: Secondary | ICD-10-CM | POA: Diagnosis not present

## 2017-11-03 DIAGNOSIS — R269 Unspecified abnormalities of gait and mobility: Secondary | ICD-10-CM | POA: Diagnosis not present

## 2017-11-03 DIAGNOSIS — Z6825 Body mass index (BMI) 25.0-25.9, adult: Secondary | ICD-10-CM | POA: Diagnosis not present

## 2017-11-03 DIAGNOSIS — I10 Essential (primary) hypertension: Secondary | ICD-10-CM | POA: Diagnosis not present

## 2017-11-03 DIAGNOSIS — I2510 Atherosclerotic heart disease of native coronary artery without angina pectoris: Secondary | ICD-10-CM | POA: Diagnosis not present

## 2017-11-03 DIAGNOSIS — R42 Dizziness and giddiness: Secondary | ICD-10-CM | POA: Diagnosis not present

## 2017-11-03 DIAGNOSIS — M1710 Unilateral primary osteoarthritis, unspecified knee: Secondary | ICD-10-CM | POA: Diagnosis not present

## 2017-11-03 DIAGNOSIS — I69359 Hemiplegia and hemiparesis following cerebral infarction affecting unspecified side: Secondary | ICD-10-CM | POA: Diagnosis not present

## 2017-12-23 DIAGNOSIS — M5136 Other intervertebral disc degeneration, lumbar region: Secondary | ICD-10-CM | POA: Diagnosis not present

## 2018-01-18 DIAGNOSIS — N189 Chronic kidney disease, unspecified: Secondary | ICD-10-CM | POA: Diagnosis not present

## 2018-01-18 DIAGNOSIS — D469 Myelodysplastic syndrome, unspecified: Secondary | ICD-10-CM | POA: Diagnosis not present

## 2018-02-17 DIAGNOSIS — R269 Unspecified abnormalities of gait and mobility: Secondary | ICD-10-CM | POA: Diagnosis not present

## 2018-02-17 DIAGNOSIS — M5136 Other intervertebral disc degeneration, lumbar region: Secondary | ICD-10-CM | POA: Diagnosis not present

## 2018-02-25 DIAGNOSIS — R269 Unspecified abnormalities of gait and mobility: Secondary | ICD-10-CM | POA: Diagnosis not present

## 2018-02-25 DIAGNOSIS — I10 Essential (primary) hypertension: Secondary | ICD-10-CM | POA: Diagnosis not present

## 2018-02-25 DIAGNOSIS — Z1339 Encounter for screening examination for other mental health and behavioral disorders: Secondary | ICD-10-CM | POA: Diagnosis not present

## 2018-02-25 DIAGNOSIS — I2510 Atherosclerotic heart disease of native coronary artery without angina pectoris: Secondary | ICD-10-CM | POA: Diagnosis not present

## 2018-02-25 DIAGNOSIS — N183 Chronic kidney disease, stage 3 (moderate): Secondary | ICD-10-CM | POA: Diagnosis not present

## 2018-02-25 DIAGNOSIS — I69359 Hemiplegia and hemiparesis following cerebral infarction affecting unspecified side: Secondary | ICD-10-CM | POA: Diagnosis not present

## 2018-03-08 DIAGNOSIS — C44629 Squamous cell carcinoma of skin of left upper limb, including shoulder: Secondary | ICD-10-CM | POA: Diagnosis not present

## 2018-03-16 DIAGNOSIS — G629 Polyneuropathy, unspecified: Secondary | ICD-10-CM | POA: Diagnosis not present

## 2018-03-17 DIAGNOSIS — Q211 Atrial septal defect: Secondary | ICD-10-CM | POA: Diagnosis not present

## 2018-03-17 DIAGNOSIS — I2510 Atherosclerotic heart disease of native coronary artery without angina pectoris: Secondary | ICD-10-CM | POA: Diagnosis not present

## 2018-03-17 DIAGNOSIS — D619 Aplastic anemia, unspecified: Secondary | ICD-10-CM | POA: Diagnosis not present

## 2018-03-17 DIAGNOSIS — I639 Cerebral infarction, unspecified: Secondary | ICD-10-CM | POA: Diagnosis not present

## 2018-03-21 DIAGNOSIS — C44629 Squamous cell carcinoma of skin of left upper limb, including shoulder: Secondary | ICD-10-CM | POA: Diagnosis not present

## 2018-03-22 DIAGNOSIS — D46Z Other myelodysplastic syndromes: Secondary | ICD-10-CM | POA: Diagnosis not present

## 2018-03-22 DIAGNOSIS — D469 Myelodysplastic syndrome, unspecified: Secondary | ICD-10-CM | POA: Diagnosis not present

## 2018-04-05 DIAGNOSIS — R2689 Other abnormalities of gait and mobility: Secondary | ICD-10-CM | POA: Diagnosis not present

## 2018-04-05 DIAGNOSIS — M5136 Other intervertebral disc degeneration, lumbar region: Secondary | ICD-10-CM | POA: Diagnosis not present

## 2018-04-19 DIAGNOSIS — C44529 Squamous cell carcinoma of skin of other part of trunk: Secondary | ICD-10-CM | POA: Diagnosis not present

## 2018-04-19 DIAGNOSIS — L570 Actinic keratosis: Secondary | ICD-10-CM | POA: Diagnosis not present

## 2018-04-19 DIAGNOSIS — C44622 Squamous cell carcinoma of skin of right upper limb, including shoulder: Secondary | ICD-10-CM | POA: Diagnosis not present

## 2018-06-01 DIAGNOSIS — L570 Actinic keratosis: Secondary | ICD-10-CM | POA: Diagnosis not present

## 2018-06-01 DIAGNOSIS — C44629 Squamous cell carcinoma of skin of left upper limb, including shoulder: Secondary | ICD-10-CM | POA: Diagnosis not present

## 2018-06-17 DIAGNOSIS — Z23 Encounter for immunization: Secondary | ICD-10-CM | POA: Diagnosis not present

## 2018-06-27 DIAGNOSIS — C44529 Squamous cell carcinoma of skin of other part of trunk: Secondary | ICD-10-CM | POA: Diagnosis not present

## 2018-06-27 DIAGNOSIS — C44619 Basal cell carcinoma of skin of left upper limb, including shoulder: Secondary | ICD-10-CM | POA: Diagnosis not present

## 2018-07-20 ENCOUNTER — Other Ambulatory Visit: Payer: Self-pay

## 2018-07-22 DIAGNOSIS — D469 Myelodysplastic syndrome, unspecified: Secondary | ICD-10-CM | POA: Diagnosis not present

## 2018-07-27 DIAGNOSIS — C44529 Squamous cell carcinoma of skin of other part of trunk: Secondary | ICD-10-CM | POA: Diagnosis not present

## 2018-08-30 DIAGNOSIS — R269 Unspecified abnormalities of gait and mobility: Secondary | ICD-10-CM | POA: Diagnosis not present

## 2018-08-30 DIAGNOSIS — I10 Essential (primary) hypertension: Secondary | ICD-10-CM | POA: Diagnosis not present

## 2018-08-30 DIAGNOSIS — I2510 Atherosclerotic heart disease of native coronary artery without angina pectoris: Secondary | ICD-10-CM | POA: Diagnosis not present

## 2018-08-30 DIAGNOSIS — Z9181 History of falling: Secondary | ICD-10-CM | POA: Diagnosis not present

## 2018-08-30 DIAGNOSIS — Z1331 Encounter for screening for depression: Secondary | ICD-10-CM | POA: Diagnosis not present

## 2018-08-30 DIAGNOSIS — I69359 Hemiplegia and hemiparesis following cerebral infarction affecting unspecified side: Secondary | ICD-10-CM | POA: Diagnosis not present

## 2018-08-30 DIAGNOSIS — N183 Chronic kidney disease, stage 3 (moderate): Secondary | ICD-10-CM | POA: Diagnosis not present

## 2018-09-20 ENCOUNTER — Other Ambulatory Visit: Payer: Self-pay | Admitting: Diagnostic Neuroimaging

## 2018-11-09 DIAGNOSIS — C44519 Basal cell carcinoma of skin of other part of trunk: Secondary | ICD-10-CM | POA: Diagnosis not present

## 2018-11-16 DIAGNOSIS — C44519 Basal cell carcinoma of skin of other part of trunk: Secondary | ICD-10-CM | POA: Diagnosis not present

## 2018-12-29 DIAGNOSIS — E785 Hyperlipidemia, unspecified: Secondary | ICD-10-CM | POA: Diagnosis not present

## 2018-12-29 DIAGNOSIS — Z9181 History of falling: Secondary | ICD-10-CM | POA: Diagnosis not present

## 2018-12-29 DIAGNOSIS — Z125 Encounter for screening for malignant neoplasm of prostate: Secondary | ICD-10-CM | POA: Diagnosis not present

## 2018-12-29 DIAGNOSIS — Z0000 Encounter for general adult medical examination without abnormal findings: Secondary | ICD-10-CM | POA: Diagnosis not present

## 2018-12-29 DIAGNOSIS — Z1331 Encounter for screening for depression: Secondary | ICD-10-CM | POA: Diagnosis not present

## 2019-01-16 DIAGNOSIS — L820 Inflamed seborrheic keratosis: Secondary | ICD-10-CM | POA: Diagnosis not present

## 2019-01-16 DIAGNOSIS — C44519 Basal cell carcinoma of skin of other part of trunk: Secondary | ICD-10-CM | POA: Diagnosis not present

## 2019-01-20 DIAGNOSIS — Z0001 Encounter for general adult medical examination with abnormal findings: Secondary | ICD-10-CM | POA: Diagnosis not present

## 2019-01-20 DIAGNOSIS — D649 Anemia, unspecified: Secondary | ICD-10-CM | POA: Diagnosis not present

## 2019-01-20 DIAGNOSIS — D469 Myelodysplastic syndrome, unspecified: Secondary | ICD-10-CM

## 2019-01-20 DIAGNOSIS — E538 Deficiency of other specified B group vitamins: Secondary | ICD-10-CM | POA: Diagnosis not present

## 2019-02-06 DIAGNOSIS — E119 Type 2 diabetes mellitus without complications: Secondary | ICD-10-CM | POA: Diagnosis not present

## 2019-02-06 DIAGNOSIS — H2703 Aphakia, bilateral: Secondary | ICD-10-CM | POA: Diagnosis not present

## 2019-02-27 DIAGNOSIS — R269 Unspecified abnormalities of gait and mobility: Secondary | ICD-10-CM | POA: Diagnosis not present

## 2019-02-27 DIAGNOSIS — N183 Chronic kidney disease, stage 3 (moderate): Secondary | ICD-10-CM | POA: Diagnosis not present

## 2019-02-27 DIAGNOSIS — Z139 Encounter for screening, unspecified: Secondary | ICD-10-CM | POA: Diagnosis not present

## 2019-02-27 DIAGNOSIS — I10 Essential (primary) hypertension: Secondary | ICD-10-CM | POA: Diagnosis not present

## 2019-02-27 DIAGNOSIS — D469 Myelodysplastic syndrome, unspecified: Secondary | ICD-10-CM | POA: Diagnosis not present

## 2019-02-27 DIAGNOSIS — I69359 Hemiplegia and hemiparesis following cerebral infarction affecting unspecified side: Secondary | ICD-10-CM | POA: Diagnosis not present

## 2019-02-27 DIAGNOSIS — I2510 Atherosclerotic heart disease of native coronary artery without angina pectoris: Secondary | ICD-10-CM | POA: Diagnosis not present

## 2019-03-01 ENCOUNTER — Other Ambulatory Visit: Payer: Self-pay | Admitting: Cardiology

## 2019-03-22 ENCOUNTER — Other Ambulatory Visit: Payer: Self-pay

## 2019-03-22 ENCOUNTER — Ambulatory Visit (INDEPENDENT_AMBULATORY_CARE_PROVIDER_SITE_OTHER): Payer: Medicare Other | Admitting: Cardiology

## 2019-03-22 ENCOUNTER — Encounter: Payer: Self-pay | Admitting: Cardiology

## 2019-03-22 VITALS — BP 134/69 | HR 54 | Ht 71.0 in | Wt 185.0 lb

## 2019-03-22 DIAGNOSIS — Z8673 Personal history of transient ischemic attack (TIA), and cerebral infarction without residual deficits: Secondary | ICD-10-CM | POA: Diagnosis not present

## 2019-03-22 DIAGNOSIS — Z951 Presence of aortocoronary bypass graft: Secondary | ICD-10-CM

## 2019-03-22 DIAGNOSIS — I2510 Atherosclerotic heart disease of native coronary artery without angina pectoris: Secondary | ICD-10-CM | POA: Diagnosis not present

## 2019-03-22 DIAGNOSIS — Z8774 Personal history of (corrected) congenital malformations of heart and circulatory system: Secondary | ICD-10-CM | POA: Diagnosis not present

## 2019-03-22 NOTE — Progress Notes (Signed)
Primary Physician/Referring:  Cyndi Bender, PA-C  Patient ID: Aaron Whitaker, male    DOB: [birth year], 78 y.o.   MRN: [id number]  Chief Complaint  Patient presents with  . Coronary Artery Disease   HPI:    HPI: Aaron Whitaker  is a 78 y.o. Caucasian male with history of hypertension, hyperlipidemia, and history of PFO closure in 2012 for secondary prevention of stroke, as well as CAD with history of MI/CABG (bypass x5) on 03/29/06 with LIMA to LAD, SVG to OM1 and OM 2, SVG to PDA and PLA during which he suffered a perioperative stroke. 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 A 30 mm Amplatzer PFO septal occluder was deployed, with successful repair of the residual ASD. Double contrast study revealed no residual shunting. Loop recorder evaluated for 2 years did not reveal A. Fib.  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 [facility].  No new symptoms, I spoke to his wife as well over the telephone.  Except patient is generally slow down but no other specific complaints.  He continues to live independently, no residual defects from prior stroke, no chest pain or dyspnea.  He has not needed any blood transfusions or and transfusions recently.  Past Medical History:  Diagnosis Date  . Arthritis   . Blood transfusion   . Heart disease   . Hx of CABG   . Hyperlipidemia   . Hypertension   . Hyperthyroidism   . Lower GI bleeding 1987   "bleeding ulcers; got 6 pints of blood"  . Macular degeneration    bilateral  . MI (myocardial infarction) ([place]) 03/2006  . Pernicious anemia   . Prostate cancer ([place]) ~2005   S/P radiation; seed implants  . Skin cancer   . Stroke [facility]) 2007; 12/2010, 03/2015   "light"; denies residuals,  July 31,2016 most recent  . Ulcerative colitis    Past Surgical History:  Procedure Laterality Date  . CARDIAC CATHETERIZATION N/A 09/10/2015   Procedure: ASD/VSD Closure;  Surgeon: [name], MD;  Location: [place] [facility];  Service: Cardiovascular;  Laterality: N/A;  . CORONARY ARTERY BYPASS GRAFT  02/2006   CABG X 5  . EP IMPLANTABLE DEVICE N/A 04/03/2015   Procedure: Loop Recorder Insertion;  Surgeon: Evans Lance, MD;  Location: [place] [facility];  Service: Cardiovascular;  Laterality: N/A;  . GASTRIC RESECTION  ~ 1987   "had ~ 90% of my stomach removed"  . 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 KNEE ARTHROSCOPY     bilaterally; "long long time ago" (11/17/11)  . PATENT FORAMEN OVALE CLOSURE  03/2015  . PATENT FORAMEN OVALE CLOSURE N/A 11/17/2011   Procedure: PATENT FORAMEN OVALE CLOSURE;  Surgeon: Laverda Page, MD;  Location: [facility] CATH LAB;  Service: Cardiovascular;  Laterality: N/A;  . TEE WITHOUT CARDIOVERSION N/A 04/03/2015   Procedure: TRANSESOPHAGEAL ECHOCARDIOGRAM (TEE);  Surgeon: Lelon Perla, MD;  Location: [facility] ENDOSCOPY;  Service: Cardiovascular;  Laterality: N/A;   Social History   Socioeconomic History  . Marital status: Married    Spouse name: Leith Szafranski  . Number of children: 0  . Years of education: 9th  . Highest education level: Not on file  Occupational  History  . Occupation: retired    Fish farm manager: OTHER  Social Needs  . Financial resource strain: Not on file  . Food insecurity    Worry: Not on file    Inability: Not on file  . Transportation needs    Medical: Not on file    Non-medical: Not on file  Tobacco Use  . Smoking status: Former Smoker    Packs/day: 0.50    Years: 8.00    Pack years: 4.00    Quit date: 08/31/1965    Years since quitting: 53.5  . Smokeless tobacco: Current User    Types: Chew  . Tobacco comment: 2 pouches daily  Substance and Sexual Activity  . Alcohol use: No    Alcohol/week: 0.0 standard drinks  . Drug use: No   . Sexual activity: Never    Birth control/protection: Abstinence  Lifestyle  . Physical activity    Days per week: Not on file    Minutes per session: Not on file  . Stress: Not on file  Relationships  . Social Herbalist on phone: Not on file    Gets together: Not on file    Attends religious service: Not on file    Active member of club or organization: Not on file    Attends meetings of clubs or organizations: Not on file    Relationship status: Not on file  . Intimate partner violence    Fear of current or ex partner: Not on file    Emotionally abused: Not on file    Physically abused: Not on file    Forced sexual activity: Not on file  Other Topics Concern  . Not on file  Social History Narrative   Pt lives at home with spouse LIZ.   Retired.   9th grade.   Right handed.   Caffeine None    ROS  Review of Systems  Constitution: Negative for chills, decreased appetite, malaise/fatigue and weight gain.  Cardiovascular: Negative for dyspnea on exertion, leg swelling and syncope.  Endocrine: Negative for cold intolerance.  Hematologic/Lymphatic: Does not bruise/bleed easily.  Musculoskeletal: Positive for joint pain (DJD). Negative for joint swelling.  Gastrointestinal: Negative for abdominal pain, anorexia, change in bowel habit, hematochezia and melena.  Neurological: Positive for disturbances in coordination. Negative for headaches and light-headedness.  Psychiatric/Behavioral: Negative for depression and substance abuse.  All other systems reviewed and are negative.  Objective  Blood pressure 134/69, pulse (!) 54, height 5\' 11"  (1.803 m), weight 185 lb (83.9 kg), SpO2 94 %. Body mass index is 25.8 kg/m.   Physical Exam  Constitutional: He appears well-developed and well-nourished. No distress.  HENT:  Head: Atraumatic.  Eyes: Conjunctivae are normal.  Neck: Neck supple. No JVD present. No thyromegaly present.  Cardiovascular: Normal rate, regular  rhythm, normal heart sounds and intact distal pulses. Exam reveals no gallop.  No murmur heard. Pulmonary/Chest: Effort normal and breath sounds normal.  Abdominal: Soft. Bowel sounds are normal.  Musculoskeletal: Normal range of motion.  Neurological: He is alert.  Skin: Skin is warm and dry.  Psychiatric: He has a normal mood and affect.   Radiology: No results found.  Laboratory examination:   CMP Latest Ref Rng & Units 04/03/2015 03/31/2015 03/31/2015  Glucose 65 - 99 mg/dL - 103(H) 103(H)  BUN 6 - 20 mg/dL - 30(H) 27(H)  Creatinine 0.61 - 1.24 mg/dL 1.24 1.20 1.27(H)  Sodium 135 - 145 mmol/L - 141 139  Potassium 3.5 - 5.1  mmol/L - 4.1 4.1  Chloride 101 - 111 mmol/L - 107 108  CO2 22 - 32 mmol/L - - 22  Calcium 8.9 - 10.3 mg/dL - - 8.8(L)  Total Protein 6.5 - 8.1 g/dL - - 6.4(L)  Total Bilirubin 0.3 - 1.2 mg/dL - - 0.7  Alkaline Phos 38 - 126 U/L - - 97  AST 15 - 41 U/L - - 20  ALT 17 - 63 U/L - - 20   CBC Latest Ref Rng & Units 04/03/2015 03/31/2015 03/31/2015  WBC 4.0 - 10.5 K/uL 7.4 - 6.3  Hemoglobin 13.0 - 17.0 g/dL 14.0 14.3 13.4  Hematocrit 39.0 - 52.0 % 41.4 42.0 40.1  Platelets 150 - 400 K/uL 223 - 233   Lipid Panel     Component Value Date/Time   CHOL 147 04/01/2015 0540   TRIG 149 04/01/2015 0540   HDL 29 (L) 04/01/2015 0540   CHOLHDL 5.1 04/01/2015 0540   VLDL 30 04/01/2015 0540   LDLCALC 88 04/01/2015 0540   HEMOGLOBIN A1C Lab Results  Component Value Date   HGBA1C 6.3 (H) 04/06/2017   MPG 128 04/01/2015   TSH No results for input(s): TSH in the last 8760 hours. Medications   Current Outpatient Medications  Medication Instructions  . amLODipine (NORVASC) 5 mg, Oral, Daily  . carvedilol (COREG) 3.125 MG tablet TAKE 1 TABLET TWICE DAILY  . clopidogrel (PLAVIX) 75 mg, Oral, Daily  . gabapentin (NEURONTIN) 100 MG capsule 2 times daily  . Multiple Vitamin (MULTIVITAMIN) capsule 1 capsule, Daily  . Multiple Vitamins-Minerals (PRESERVISION AREDS 2 PO)  Oral, 2 times daily before meals  . rosuvastatin (CRESTOR) 40 MG tablet TAKE 1 TABLET EVERY DAY    Cardiac Studies:   Echocardiogram 09/11/2015 :   Left ventricle: The cavity size was normal. There was mild focal   basal hypertrophy of the septum. Systolic function was normal.   The estimated ejection fraction was in the range of 50% to 55%.   Regional wall motion abnormalities cannot be excluded. The study   is not technically sufficient to allow evaluation of LV diastolic   function. - Aortic valve: There was mild regurgitation. - Right ventricle: The cavity size was mildly dilated. 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 No   residual shunting by color Doppler evaluation. The occluder   appears to be in good position. the right atrial disk appears   slightly splayed.   Assessment     ICD-10-CM   1. Coronary artery disease involving native coronary artery of native heart without angina pectoris  I25.10 EKG 12-Lead    Obtain medical records  2. Hx of CABG  Z95.1    CABG (bypass x5) 03/29/06: LIMA to LAD, SVG to OM1 and OM 2, SVG to PDA  3. H/O congenital atrial septal defect (ASD) repair  Z87.74 EKG 12-Lead   09/10/2015: Repair of residual ASD (PFO repair in 2013-Helex) with 35 mm Amplatzer PFO occluder  4. H/O: CVA (cerebrovascular accident)  Z86.73    01/16/2011 and again on 09/28/2014 and third CVA on 03/31/2015 with dysarthria and right cerebral infarct.    EKG 03/22/2019: Sinus bradycardia at the rate of 55 bpm with first-degree AV block, cannot exclude inferior infarct old.  No evidence of ischemia, normal QT interval. No significant change from  EKG 03/16/2018.  Recommendations:   Patient with known coronary artery disease and CABG, PFO with cryptogenic stroke felt to be paradoxical strokes S/P PFO/ASD repair and no  recurrence of stroke since 2013, myelodysplastic syndrome, hypertension and hyperlipidemia presents for annual visit.   He is presently doing well  and maintains sinus rhythm, with first-degree AV block, no change in EKG for the past 2 years.  He has not had any recurrence of angina, no recurrence of stroke-like symptoms.  Blood pressure is well controlled.  I do not has recent labs, we'll request PCP to similar labs especially to look for hyperlipidemia and lipids are well controlled in the office underlying cardiac risk factors including coronary artery disease and prior stroke.  Otherwise I'll see him back in a year.  [name], MD, [facility] 03/22/2019, 11:00 AM [facility] [place] Pager: [phone] Office: [phone] If no answer Cell [phone]

## 2019-04-14 DIAGNOSIS — G609 Hereditary and idiopathic neuropathy, unspecified: Secondary | ICD-10-CM | POA: Diagnosis not present

## 2019-04-14 DIAGNOSIS — R269 Unspecified abnormalities of gait and mobility: Secondary | ICD-10-CM | POA: Diagnosis not present

## 2019-04-14 DIAGNOSIS — R531 Weakness: Secondary | ICD-10-CM | POA: Diagnosis not present

## 2019-04-14 DIAGNOSIS — I69359 Hemiplegia and hemiparesis following cerebral infarction affecting unspecified side: Secondary | ICD-10-CM | POA: Diagnosis not present

## 2019-04-20 DIAGNOSIS — M1711 Unilateral primary osteoarthritis, right knee: Secondary | ICD-10-CM | POA: Diagnosis not present

## 2019-05-08 ENCOUNTER — Other Ambulatory Visit: Payer: Self-pay | Admitting: Cardiology

## 2019-05-29 DIAGNOSIS — M1711 Unilateral primary osteoarthritis, right knee: Secondary | ICD-10-CM | POA: Diagnosis not present

## 2019-06-12 DIAGNOSIS — Z23 Encounter for immunization: Secondary | ICD-10-CM | POA: Diagnosis not present

## 2019-07-03 ENCOUNTER — Other Ambulatory Visit: Payer: Self-pay

## 2019-07-03 MED ORDER — CARVEDILOL 3.125 MG PO TABS: 3.1250 mg | ORAL_TABLET | Freq: Two times a day (BID) | ORAL | 3 refills | Status: DC

## 2019-07-04 ENCOUNTER — Other Ambulatory Visit: Payer: Self-pay

## 2019-07-04 DIAGNOSIS — M79671 Pain in right foot: Secondary | ICD-10-CM | POA: Diagnosis not present

## 2019-07-04 DIAGNOSIS — E0840 Diabetes mellitus due to underlying condition with diabetic neuropathy, unspecified: Secondary | ICD-10-CM | POA: Diagnosis not present

## 2019-07-04 DIAGNOSIS — M79672 Pain in left foot: Secondary | ICD-10-CM | POA: Diagnosis not present

## 2019-07-04 DIAGNOSIS — R29898 Other symptoms and signs involving the musculoskeletal system: Secondary | ICD-10-CM | POA: Diagnosis not present

## 2019-07-07 DIAGNOSIS — R2681 Unsteadiness on feet: Secondary | ICD-10-CM | POA: Diagnosis not present

## 2019-07-07 DIAGNOSIS — M79671 Pain in right foot: Secondary | ICD-10-CM | POA: Diagnosis not present

## 2019-07-07 DIAGNOSIS — I63411 Cerebral infarction due to embolism of right middle cerebral artery: Secondary | ICD-10-CM | POA: Diagnosis not present

## 2019-07-07 DIAGNOSIS — R29898 Other symptoms and signs involving the musculoskeletal system: Secondary | ICD-10-CM | POA: Diagnosis not present

## 2019-07-07 DIAGNOSIS — G629 Polyneuropathy, unspecified: Secondary | ICD-10-CM | POA: Diagnosis not present

## 2019-07-07 DIAGNOSIS — E0840 Diabetes mellitus due to underlying condition with diabetic neuropathy, unspecified: Secondary | ICD-10-CM | POA: Diagnosis not present

## 2019-07-07 DIAGNOSIS — M79672 Pain in left foot: Secondary | ICD-10-CM | POA: Diagnosis not present

## 2019-07-24 DIAGNOSIS — D649 Anemia, unspecified: Secondary | ICD-10-CM | POA: Diagnosis not present

## 2019-07-24 DIAGNOSIS — D469 Myelodysplastic syndrome, unspecified: Secondary | ICD-10-CM

## 2019-07-28 DIAGNOSIS — M545 Low back pain: Secondary | ICD-10-CM | POA: Diagnosis not present

## 2019-07-28 DIAGNOSIS — R29898 Other symptoms and signs involving the musculoskeletal system: Secondary | ICD-10-CM | POA: Diagnosis not present

## 2019-07-28 DIAGNOSIS — M5126 Other intervertebral disc displacement, lumbar region: Secondary | ICD-10-CM | POA: Diagnosis not present

## 2019-07-28 DIAGNOSIS — G8929 Other chronic pain: Secondary | ICD-10-CM | POA: Diagnosis not present

## 2019-08-02 DIAGNOSIS — M5136 Other intervertebral disc degeneration, lumbar region: Secondary | ICD-10-CM | POA: Diagnosis not present

## 2019-08-02 DIAGNOSIS — M9973 Connective tissue and disc stenosis of intervertebral foramina of lumbar region: Secondary | ICD-10-CM | POA: Diagnosis not present

## 2019-08-02 DIAGNOSIS — G8929 Other chronic pain: Secondary | ICD-10-CM | POA: Diagnosis not present

## 2019-08-02 DIAGNOSIS — M545 Low back pain: Secondary | ICD-10-CM | POA: Diagnosis not present

## 2019-10-26 DIAGNOSIS — M545 Low back pain: Secondary | ICD-10-CM | POA: Diagnosis not present

## 2019-10-26 DIAGNOSIS — M7918 Myalgia, other site: Secondary | ICD-10-CM | POA: Diagnosis not present

## 2019-10-26 DIAGNOSIS — M5136 Other intervertebral disc degeneration, lumbar region: Secondary | ICD-10-CM | POA: Diagnosis not present

## 2019-10-26 DIAGNOSIS — G8929 Other chronic pain: Secondary | ICD-10-CM | POA: Diagnosis not present

## 2019-10-26 DIAGNOSIS — M9973 Connective tissue and disc stenosis of intervertebral foramina of lumbar region: Secondary | ICD-10-CM | POA: Diagnosis not present

## 2019-11-23 DIAGNOSIS — M545 Low back pain: Secondary | ICD-10-CM | POA: Diagnosis not present

## 2019-11-23 DIAGNOSIS — G8929 Other chronic pain: Secondary | ICD-10-CM | POA: Diagnosis not present

## 2019-11-23 DIAGNOSIS — M7918 Myalgia, other site: Secondary | ICD-10-CM | POA: Diagnosis not present

## 2019-11-23 DIAGNOSIS — M5136 Other intervertebral disc degeneration, lumbar region: Secondary | ICD-10-CM | POA: Diagnosis not present

## 2019-12-05 DIAGNOSIS — Z951 Presence of aortocoronary bypass graft: Secondary | ICD-10-CM | POA: Diagnosis not present

## 2019-12-05 DIAGNOSIS — T18120A Food in esophagus causing compression of trachea, initial encounter: Secondary | ICD-10-CM | POA: Diagnosis not present

## 2019-12-05 DIAGNOSIS — K2210 Ulcer of esophagus without bleeding: Secondary | ICD-10-CM | POA: Diagnosis not present

## 2019-12-05 DIAGNOSIS — R1310 Dysphagia, unspecified: Secondary | ICD-10-CM | POA: Diagnosis not present

## 2019-12-05 DIAGNOSIS — Z79899 Other long term (current) drug therapy: Secondary | ICD-10-CM | POA: Diagnosis not present

## 2019-12-05 DIAGNOSIS — E7800 Pure hypercholesterolemia, unspecified: Secondary | ICD-10-CM | POA: Diagnosis not present

## 2019-12-05 DIAGNOSIS — R918 Other nonspecific abnormal finding of lung field: Secondary | ICD-10-CM | POA: Diagnosis not present

## 2019-12-05 DIAGNOSIS — I2510 Atherosclerotic heart disease of native coronary artery without angina pectoris: Secondary | ICD-10-CM | POA: Diagnosis not present

## 2019-12-05 DIAGNOSIS — I252 Old myocardial infarction: Secondary | ICD-10-CM | POA: Diagnosis not present

## 2019-12-05 DIAGNOSIS — K222 Esophageal obstruction: Secondary | ICD-10-CM | POA: Diagnosis not present

## 2019-12-05 DIAGNOSIS — T18128A Food in esophagus causing other injury, initial encounter: Secondary | ICD-10-CM | POA: Diagnosis not present

## 2019-12-05 DIAGNOSIS — Z8711 Personal history of peptic ulcer disease: Secondary | ICD-10-CM | POA: Diagnosis not present

## 2019-12-05 DIAGNOSIS — Z7982 Long term (current) use of aspirin: Secondary | ICD-10-CM | POA: Diagnosis not present

## 2019-12-05 DIAGNOSIS — K449 Diaphragmatic hernia without obstruction or gangrene: Secondary | ICD-10-CM | POA: Diagnosis not present

## 2019-12-05 DIAGNOSIS — R112 Nausea with vomiting, unspecified: Secondary | ICD-10-CM | POA: Diagnosis not present

## 2019-12-05 DIAGNOSIS — Z87891 Personal history of nicotine dependence: Secondary | ICD-10-CM | POA: Diagnosis not present

## 2019-12-05 DIAGNOSIS — T18108A Unspecified foreign body in esophagus causing other injury, initial encounter: Secondary | ICD-10-CM | POA: Diagnosis not present

## 2019-12-05 DIAGNOSIS — Z8673 Personal history of transient ischemic attack (TIA), and cerebral infarction without residual deficits: Secondary | ICD-10-CM | POA: Diagnosis not present

## 2019-12-18 DIAGNOSIS — R531 Weakness: Secondary | ICD-10-CM | POA: Diagnosis not present

## 2019-12-18 DIAGNOSIS — R0902 Hypoxemia: Secondary | ICD-10-CM | POA: Diagnosis not present

## 2019-12-18 DIAGNOSIS — N1831 Chronic kidney disease, stage 3a: Secondary | ICD-10-CM | POA: Diagnosis present

## 2019-12-18 DIAGNOSIS — R4020 Unspecified coma: Secondary | ICD-10-CM | POA: Diagnosis not present

## 2019-12-18 DIAGNOSIS — J986 Disorders of diaphragm: Secondary | ICD-10-CM | POA: Diagnosis not present

## 2019-12-18 DIAGNOSIS — Z8711 Personal history of peptic ulcer disease: Secondary | ICD-10-CM | POA: Diagnosis not present

## 2019-12-18 DIAGNOSIS — N179 Acute kidney failure, unspecified: Secondary | ICD-10-CM | POA: Diagnosis not present

## 2019-12-18 DIAGNOSIS — E531 Pyridoxine deficiency: Secondary | ICD-10-CM | POA: Diagnosis not present

## 2019-12-18 DIAGNOSIS — R4182 Altered mental status, unspecified: Secondary | ICD-10-CM | POA: Diagnosis not present

## 2019-12-18 DIAGNOSIS — Z7902 Long term (current) use of antithrombotics/antiplatelets: Secondary | ICD-10-CM | POA: Diagnosis not present

## 2019-12-18 DIAGNOSIS — I491 Atrial premature depolarization: Secondary | ICD-10-CM | POA: Diagnosis not present

## 2019-12-18 DIAGNOSIS — Z8673 Personal history of transient ischemic attack (TIA), and cerebral infarction without residual deficits: Secondary | ICD-10-CM | POA: Diagnosis not present

## 2019-12-18 DIAGNOSIS — K529 Noninfective gastroenteritis and colitis, unspecified: Secondary | ICD-10-CM | POA: Diagnosis not present

## 2019-12-18 DIAGNOSIS — I959 Hypotension, unspecified: Secondary | ICD-10-CM | POA: Diagnosis not present

## 2019-12-18 DIAGNOSIS — I129 Hypertensive chronic kidney disease with stage 1 through stage 4 chronic kidney disease, or unspecified chronic kidney disease: Secondary | ICD-10-CM | POA: Diagnosis present

## 2019-12-18 DIAGNOSIS — G9341 Metabolic encephalopathy: Secondary | ICD-10-CM | POA: Diagnosis present

## 2019-12-18 DIAGNOSIS — Z87891 Personal history of nicotine dependence: Secondary | ICD-10-CM | POA: Diagnosis not present

## 2019-12-18 DIAGNOSIS — R1110 Vomiting, unspecified: Secondary | ICD-10-CM | POA: Diagnosis not present

## 2019-12-18 DIAGNOSIS — Z7982 Long term (current) use of aspirin: Secondary | ICD-10-CM | POA: Diagnosis not present

## 2019-12-18 DIAGNOSIS — Z79899 Other long term (current) drug therapy: Secondary | ICD-10-CM | POA: Diagnosis not present

## 2019-12-18 DIAGNOSIS — I252 Old myocardial infarction: Secondary | ICD-10-CM | POA: Diagnosis not present

## 2019-12-18 DIAGNOSIS — R197 Diarrhea, unspecified: Secondary | ICD-10-CM | POA: Diagnosis not present

## 2019-12-18 DIAGNOSIS — R112 Nausea with vomiting, unspecified: Secondary | ICD-10-CM | POA: Diagnosis not present

## 2019-12-18 DIAGNOSIS — Z951 Presence of aortocoronary bypass graft: Secondary | ICD-10-CM | POA: Diagnosis not present

## 2019-12-18 DIAGNOSIS — I2510 Atherosclerotic heart disease of native coronary artery without angina pectoris: Secondary | ICD-10-CM | POA: Diagnosis present

## 2019-12-18 DIAGNOSIS — E860 Dehydration: Secondary | ICD-10-CM | POA: Diagnosis not present

## 2019-12-18 DIAGNOSIS — I4891 Unspecified atrial fibrillation: Secondary | ICD-10-CM | POA: Diagnosis present

## 2019-12-18 DIAGNOSIS — M1990 Unspecified osteoarthritis, unspecified site: Secondary | ICD-10-CM | POA: Diagnosis present

## 2019-12-19 DIAGNOSIS — N179 Acute kidney failure, unspecified: Secondary | ICD-10-CM | POA: Diagnosis not present

## 2019-12-19 DIAGNOSIS — K529 Noninfective gastroenteritis and colitis, unspecified: Secondary | ICD-10-CM | POA: Diagnosis not present

## 2019-12-19 DIAGNOSIS — E860 Dehydration: Secondary | ICD-10-CM | POA: Diagnosis not present

## 2019-12-19 DIAGNOSIS — I959 Hypotension, unspecified: Secondary | ICD-10-CM | POA: Diagnosis not present

## 2019-12-20 DIAGNOSIS — I959 Hypotension, unspecified: Secondary | ICD-10-CM | POA: Diagnosis not present

## 2019-12-20 DIAGNOSIS — K529 Noninfective gastroenteritis and colitis, unspecified: Secondary | ICD-10-CM | POA: Diagnosis not present

## 2019-12-20 DIAGNOSIS — E860 Dehydration: Secondary | ICD-10-CM | POA: Diagnosis not present

## 2019-12-20 DIAGNOSIS — N179 Acute kidney failure, unspecified: Secondary | ICD-10-CM | POA: Diagnosis not present

## 2019-12-21 DIAGNOSIS — E860 Dehydration: Secondary | ICD-10-CM | POA: Diagnosis not present

## 2019-12-21 DIAGNOSIS — K529 Noninfective gastroenteritis and colitis, unspecified: Secondary | ICD-10-CM | POA: Diagnosis not present

## 2019-12-21 DIAGNOSIS — N179 Acute kidney failure, unspecified: Secondary | ICD-10-CM | POA: Diagnosis not present

## 2019-12-21 DIAGNOSIS — I959 Hypotension, unspecified: Secondary | ICD-10-CM | POA: Diagnosis not present

## 2019-12-25 DIAGNOSIS — M47815 Spondylosis without myelopathy or radiculopathy, thoracolumbar region: Secondary | ICD-10-CM | POA: Diagnosis not present

## 2019-12-25 DIAGNOSIS — M5136 Other intervertebral disc degeneration, lumbar region: Secondary | ICD-10-CM | POA: Diagnosis not present

## 2019-12-25 DIAGNOSIS — M546 Pain in thoracic spine: Secondary | ICD-10-CM | POA: Diagnosis not present

## 2019-12-25 DIAGNOSIS — M47894 Other spondylosis, thoracic region: Secondary | ICD-10-CM | POA: Diagnosis not present

## 2019-12-28 DIAGNOSIS — N189 Chronic kidney disease, unspecified: Secondary | ICD-10-CM | POA: Diagnosis not present

## 2019-12-28 DIAGNOSIS — Z79899 Other long term (current) drug therapy: Secondary | ICD-10-CM | POA: Diagnosis not present

## 2019-12-28 DIAGNOSIS — N183 Chronic kidney disease, stage 3 unspecified: Secondary | ICD-10-CM | POA: Diagnosis not present

## 2019-12-28 DIAGNOSIS — K529 Noninfective gastroenteritis and colitis, unspecified: Secondary | ICD-10-CM | POA: Diagnosis not present

## 2019-12-28 DIAGNOSIS — N179 Acute kidney failure, unspecified: Secondary | ICD-10-CM | POA: Diagnosis not present

## 2019-12-28 DIAGNOSIS — E860 Dehydration: Secondary | ICD-10-CM | POA: Diagnosis not present

## 2019-12-29 DIAGNOSIS — Z1159 Encounter for screening for other viral diseases: Secondary | ICD-10-CM | POA: Diagnosis not present

## 2019-12-29 DIAGNOSIS — J3489 Other specified disorders of nose and nasal sinuses: Secondary | ICD-10-CM | POA: Diagnosis not present

## 2019-12-29 DIAGNOSIS — Z20828 Contact with and (suspected) exposure to other viral communicable diseases: Secondary | ICD-10-CM | POA: Diagnosis not present

## 2020-01-01 DIAGNOSIS — Z1331 Encounter for screening for depression: Secondary | ICD-10-CM | POA: Diagnosis not present

## 2020-01-01 DIAGNOSIS — Z0000 Encounter for general adult medical examination without abnormal findings: Secondary | ICD-10-CM | POA: Diagnosis not present

## 2020-01-01 DIAGNOSIS — Z9181 History of falling: Secondary | ICD-10-CM | POA: Diagnosis not present

## 2020-01-05 DIAGNOSIS — K209 Esophagitis, unspecified without bleeding: Secondary | ICD-10-CM | POA: Diagnosis not present

## 2020-01-05 DIAGNOSIS — Z8546 Personal history of malignant neoplasm of prostate: Secondary | ICD-10-CM | POA: Diagnosis not present

## 2020-01-05 DIAGNOSIS — K449 Diaphragmatic hernia without obstruction or gangrene: Secondary | ICD-10-CM | POA: Diagnosis not present

## 2020-01-05 DIAGNOSIS — Z87891 Personal history of nicotine dependence: Secondary | ICD-10-CM | POA: Diagnosis not present

## 2020-01-05 DIAGNOSIS — I2510 Atherosclerotic heart disease of native coronary artery without angina pectoris: Secondary | ICD-10-CM | POA: Diagnosis not present

## 2020-01-05 DIAGNOSIS — I10 Essential (primary) hypertension: Secondary | ICD-10-CM | POA: Diagnosis not present

## 2020-01-05 DIAGNOSIS — K222 Esophageal obstruction: Secondary | ICD-10-CM | POA: Diagnosis not present

## 2020-01-05 DIAGNOSIS — I252 Old myocardial infarction: Secondary | ICD-10-CM | POA: Diagnosis not present

## 2020-01-05 DIAGNOSIS — R1310 Dysphagia, unspecified: Secondary | ICD-10-CM | POA: Diagnosis not present

## 2020-01-05 DIAGNOSIS — K2970 Gastritis, unspecified, without bleeding: Secondary | ICD-10-CM | POA: Diagnosis not present

## 2020-01-05 DIAGNOSIS — Z951 Presence of aortocoronary bypass graft: Secondary | ICD-10-CM | POA: Diagnosis not present

## 2020-01-08 ENCOUNTER — Other Ambulatory Visit: Payer: Self-pay

## 2020-01-08 MED ORDER — ROSUVASTATIN CALCIUM 40 MG PO TABS: 40.0000 mg | ORAL_TABLET | Freq: Every day | ORAL | 0 refills | Status: DC

## 2020-01-09 DIAGNOSIS — M25531 Pain in right wrist: Secondary | ICD-10-CM | POA: Diagnosis not present

## 2020-01-11 DIAGNOSIS — Z981 Arthrodesis status: Secondary | ICD-10-CM | POA: Diagnosis not present

## 2020-01-19 DIAGNOSIS — M47816 Spondylosis without myelopathy or radiculopathy, lumbar region: Secondary | ICD-10-CM | POA: Diagnosis not present

## 2020-01-19 DIAGNOSIS — M5136 Other intervertebral disc degeneration, lumbar region: Secondary | ICD-10-CM | POA: Diagnosis not present

## 2020-01-22 DIAGNOSIS — D649 Anemia, unspecified: Secondary | ICD-10-CM | POA: Diagnosis not present

## 2020-01-22 DIAGNOSIS — D46Z Other myelodysplastic syndromes: Secondary | ICD-10-CM | POA: Diagnosis not present

## 2020-01-23 DIAGNOSIS — S52601A Unspecified fracture of lower end of right ulna, initial encounter for closed fracture: Secondary | ICD-10-CM | POA: Diagnosis not present

## 2020-01-23 DIAGNOSIS — Z981 Arthrodesis status: Secondary | ICD-10-CM | POA: Diagnosis not present

## 2020-01-23 DIAGNOSIS — M25531 Pain in right wrist: Secondary | ICD-10-CM | POA: Diagnosis not present

## 2020-01-31 DIAGNOSIS — S52601A Unspecified fracture of lower end of right ulna, initial encounter for closed fracture: Secondary | ICD-10-CM | POA: Diagnosis not present

## 2020-01-31 DIAGNOSIS — R29898 Other symptoms and signs involving the musculoskeletal system: Secondary | ICD-10-CM | POA: Diagnosis not present

## 2020-02-07 DIAGNOSIS — M47816 Spondylosis without myelopathy or radiculopathy, lumbar region: Secondary | ICD-10-CM | POA: Diagnosis not present

## 2020-02-07 DIAGNOSIS — M5136 Other intervertebral disc degeneration, lumbar region: Secondary | ICD-10-CM | POA: Diagnosis not present

## 2020-02-20 DIAGNOSIS — M11261 Other chondrocalcinosis, right knee: Secondary | ICD-10-CM | POA: Diagnosis not present

## 2020-02-20 DIAGNOSIS — M25061 Hemarthrosis, right knee: Secondary | ICD-10-CM | POA: Diagnosis not present

## 2020-03-13 DIAGNOSIS — Z7902 Long term (current) use of antithrombotics/antiplatelets: Secondary | ICD-10-CM | POA: Diagnosis not present

## 2020-03-13 DIAGNOSIS — M11261 Other chondrocalcinosis, right knee: Secondary | ICD-10-CM | POA: Diagnosis not present

## 2020-03-13 DIAGNOSIS — M25061 Hemarthrosis, right knee: Secondary | ICD-10-CM | POA: Diagnosis not present

## 2020-03-18 DIAGNOSIS — R1310 Dysphagia, unspecified: Secondary | ICD-10-CM | POA: Diagnosis not present

## 2020-03-20 DIAGNOSIS — Z9889 Other specified postprocedural states: Secondary | ICD-10-CM | POA: Diagnosis not present

## 2020-03-20 DIAGNOSIS — S59091D Other physeal fracture of lower end of ulna, right arm, subsequent encounter for fracture with routine healing: Secondary | ICD-10-CM | POA: Diagnosis not present

## 2020-03-20 DIAGNOSIS — S52601D Unspecified fracture of lower end of right ulna, subsequent encounter for closed fracture with routine healing: Secondary | ICD-10-CM | POA: Diagnosis not present

## 2020-03-20 DIAGNOSIS — Z981 Arthrodesis status: Secondary | ICD-10-CM | POA: Diagnosis not present

## 2020-03-21 ENCOUNTER — Ambulatory Visit: Payer: Medicare Other | Admitting: Cardiology

## 2020-03-21 ENCOUNTER — Other Ambulatory Visit: Payer: Self-pay

## 2020-03-21 ENCOUNTER — Encounter: Payer: Self-pay | Admitting: Cardiology

## 2020-03-21 VITALS — BP 125/70 | HR 63 | Resp 15 | Ht 71.0 in | Wt 178.0 lb

## 2020-03-21 DIAGNOSIS — I2510 Atherosclerotic heart disease of native coronary artery without angina pectoris: Secondary | ICD-10-CM

## 2020-03-21 DIAGNOSIS — E7800 Pure hypercholesterolemia, unspecified: Secondary | ICD-10-CM | POA: Diagnosis not present

## 2020-03-21 DIAGNOSIS — Z951 Presence of aortocoronary bypass graft: Secondary | ICD-10-CM

## 2020-03-21 DIAGNOSIS — I10 Essential (primary) hypertension: Secondary | ICD-10-CM | POA: Diagnosis not present

## 2020-03-21 DIAGNOSIS — Z8673 Personal history of transient ischemic attack (TIA), and cerebral infarction without residual deficits: Secondary | ICD-10-CM

## 2020-03-21 NOTE — Progress Notes (Signed)
Primary Physician/Referring:  Cyndi Bender, PA-C  Patient ID: Aaron Whitaker, male    DOB: December 22, 1940, 79 y.o.   MRN: [id number]  Chief Complaint  Patient presents with  . Follow-up    1 year  . Coronary Artery Disease   HPI:    HPI: Aaron Whitaker  is a 79 y.o. Caucasian male with history of hypertension, hyperlipidemia, and history of PFO closure in 2012 for secondary prevention of stroke, as well as CAD with history of MI/CABG (bypass x5) on 03/29/06 with LIMA to LAD, SVG to OM1 and OM 2, SVG to PDA and PLA during which he suffered a perioperative stroke. 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 A 30 mm Amplatzer PFO septal occluder was deployed, with successful repair of the residual ASD. Double contrast study revealed no residual shunting. Loop recorder evaluated for 2 years did not reveal A. Fib.  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 [facility] and has done well.    Presently doing well and no chest pain, dyspnea or leg edema.  Past Medical History:  Diagnosis Date  . Arthritis   . Blood transfusion   . Heart disease   . Hx of CABG   . Hyperlipidemia   . Hypertension   . Hyperthyroidism   . Lower GI bleeding 1987   "bleeding ulcers; got 6 pints of blood"  . Macular degeneration    bilateral  . MI (myocardial infarction) ([place]) 03/2006  . Pernicious anemia   . Prostate cancer ([place]) ~2005   S/P radiation; seed implants  . Skin cancer   . Stroke [facility]) 2007; 12/2010, 03/2015   "light"; denies residuals, July 31,2016 most recent  . Ulcerative colitis    Past Surgical History:  Procedure Laterality Date  . CARDIAC CATHETERIZATION N/A 09/10/2015   Procedure: ASD/VSD Closure;  Surgeon: [name], MD;  Location: [place] [facility];  Service: Cardiovascular;  Laterality:  N/A;  . CORONARY ARTERY BYPASS GRAFT  02/2006   CABG X 5  . EP IMPLANTABLE DEVICE N/A 04/03/2015   Procedure: Loop Recorder Insertion;  Surgeon: Evans Lance, MD;  Location: [place] [facility];  Service: Cardiovascular;  Laterality: N/A;  . GASTRIC RESECTION  ~ 1987   "had ~ 90% of my stomach removed"  . 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 KNEE ARTHROSCOPY     bilaterally; "long long time ago" (11/17/11)  . PATENT FORAMEN OVALE CLOSURE  03/2015  . PATENT FORAMEN OVALE CLOSURE N/A 11/17/2011   Procedure: PATENT FORAMEN OVALE CLOSURE;  Surgeon: Laverda Page, MD;  Location: [facility] CATH LAB;  Service: Cardiovascular;  Laterality: N/A;  . TEE WITHOUT CARDIOVERSION N/A 04/03/2015   Procedure: TRANSESOPHAGEAL ECHOCARDIOGRAM (TEE);  Surgeon: Lelon Perla, MD;  Location: [facility] ENDOSCOPY;  Service: Cardiovascular;  Laterality: N/A;   Social History   Tobacco Use  . Smoking status: Former Smoker    Packs/day: 0.50    Years: 8.00    Pack years: 4.00    Quit date: 08/31/1965    Years since quitting: 54.5  . Smokeless tobacco: Current User    Types: Chew  . Tobacco comment: 2 pouches daily  Substance Use Topics  . Alcohol use: No    Alcohol/week: 0.0 standard drinks  Marital Status: Married  ROS  Review of Systems  Cardiovascular: Negative for chest pain, dyspnea on exertion and leg swelling.  Musculoskeletal: Positive for joint pain.  Gastrointestinal: Negative for melena.   Objective  Blood pressure 125/70, pulse 63, resp. rate 15, height 5\' 11"  (1.803 m), weight 178 lb (80.7 kg), SpO2 94 %. Body mass index is 24.83 kg/m.   Physical Exam Constitutional:      General: He is not in acute distress.    Appearance: Normal appearance. He is well-developed.  Neck:     Thyroid: No thyromegaly.     Vascular: No JVD.  Cardiovascular:     Rate and Rhythm: Normal rate and regular rhythm.     Pulses: Intact distal pulses.     Heart sounds: Normal heart sounds. No murmur heard.  No gallop.    Pulmonary:     Effort: Pulmonary effort is normal.     Breath sounds: Normal breath sounds.  Abdominal:     General: Bowel sounds are normal.     Palpations: Abdomen is soft.    Radiology: No results found.  Laboratory examination:    External labs:  Cholesterol, total 124.000 m 03/05/2020 HDL 34.000 mg 03/05/2020 LDL-C 68.000 mg 03/05/2020 Triglycerides 122.000 m 03/05/2020  A1C 6.300 04/06/2017 TSH 2.970 07/02/2016  Hemoglobin 12.100 g/d 03/05/2020 INR 1.000 09/04/2015 Platelets 276.000 x1 03/05/2020  Creatinine, Serum 1.760 mg/ 03/05/2020 Potassium 5.400 mm 03/05/2020 ALT (SGPT) 22.000 IU/ 03/05/2020  Medications   Current Outpatient Medications  Medication Instructions  . amLODipine (NORVASC) 5 mg, Oral, Daily  . carvedilol (COREG) 3.125 mg, Oral, 2 times daily  . clopidogrel (PLAVIX) 75 mg, Oral, Daily  . gabapentin (NEURONTIN) 100 MG capsule 2 times daily  . Multiple Vitamin (MULTIVITAMIN) capsule 1 capsule, Daily  . Multiple Vitamins-Minerals (PRESERVISION AREDS 2 PO) Oral, 2 times daily before meals  . rosuvastatin (CRESTOR) 40 mg, Oral, Daily    Cardiac Studies:   Echocardiogram 09/11/2015 :   Left ventricle: The cavity size was normal. There was mild focal   basal hypertrophy of the septum. Systolic function was normal.   The estimated ejection fraction was in the range of 50% to 55%.   Regional wall motion abnormalities cannot be excluded. The study   is not technically sufficient to allow evaluation of LV diastolic   function. - Aortic valve: There was mild regurgitation. - Right ventricle: The cavity size was mildly dilated. 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 No residual shunting by color Doppler evaluation. The occluder   appears to be in good position. the right atrial disk appears   slightly splayed.  EKG 03/21/2020: Sinus rhythm with first-degree block at the rate of 60 bpm, inferior infarct old.  No evidence of ischemia.  No significant  change from 03/22/2019.  Assessment     ICD-10-CM   1. Coronary artery disease involving native coronary artery of native heart without angina pectoris  I25.10 EKG 12-Lead  2. Hx of CABG  Z95.1   3. H/O: CVA (cerebrovascular accident)  Z58.73   4. Hypercholesteremia  E78.00   5. Primary hypertension  I10     Recommendations:   Aaron Whitaker  is a 79 y.o. Caucasian male with history of hypertension, hyperlipidemia, and CAD with history of MI/CABG (bypass x5) on 03/29/06 with LIMA to LAD, SVG to OM1 and OM 2, SVG to PDA and PLA during which he suffered a perioperative stroke,  history of PFO closure in 2012 for secondary prevention of  stroke. 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 A 30 mm Amplatzer PFO septal occluder, . Loop recorder evaluated for 2 years did not reveal A. Fib.  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 From cardiac standpoint he is presently doing well without recurrence of angina, or TIA-like symptoms. External labs reviewed, lipids are under excellent control. Blood pressure is also well controlled. No changes in the medications were done today, I will see him back in 1 year for follow-up.   [name], MD, [facility] 03/23/2020, 10:47 AM Office: [phone]

## 2020-03-25 DIAGNOSIS — M48061 Spinal stenosis, lumbar region without neurogenic claudication: Secondary | ICD-10-CM | POA: Diagnosis not present

## 2020-03-25 DIAGNOSIS — M5136 Other intervertebral disc degeneration, lumbar region: Secondary | ICD-10-CM | POA: Diagnosis not present

## 2020-03-25 DIAGNOSIS — M1711 Unilateral primary osteoarthritis, right knee: Secondary | ICD-10-CM | POA: Diagnosis not present

## 2020-03-25 DIAGNOSIS — G8929 Other chronic pain: Secondary | ICD-10-CM | POA: Diagnosis not present

## 2020-03-25 DIAGNOSIS — M545 Low back pain: Secondary | ICD-10-CM | POA: Diagnosis not present

## 2020-03-27 DIAGNOSIS — H2703 Aphakia, bilateral: Secondary | ICD-10-CM | POA: Diagnosis not present

## 2020-03-27 DIAGNOSIS — H3530 Unspecified macular degeneration: Secondary | ICD-10-CM | POA: Diagnosis not present

## 2020-04-03 ENCOUNTER — Other Ambulatory Visit: Payer: Self-pay | Admitting: Cardiology

## 2020-04-25 DIAGNOSIS — M1711 Unilateral primary osteoarthritis, right knee: Secondary | ICD-10-CM | POA: Diagnosis not present

## 2020-05-29 DIAGNOSIS — M47816 Spondylosis without myelopathy or radiculopathy, lumbar region: Secondary | ICD-10-CM | POA: Diagnosis not present

## 2020-05-29 DIAGNOSIS — G8929 Other chronic pain: Secondary | ICD-10-CM | POA: Diagnosis not present

## 2020-05-29 DIAGNOSIS — M5136 Other intervertebral disc degeneration, lumbar region: Secondary | ICD-10-CM | POA: Diagnosis not present

## 2020-05-29 DIAGNOSIS — M25561 Pain in right knee: Secondary | ICD-10-CM | POA: Diagnosis not present

## 2020-05-29 DIAGNOSIS — M25562 Pain in left knee: Secondary | ICD-10-CM | POA: Diagnosis not present

## 2020-06-04 DIAGNOSIS — Z23 Encounter for immunization: Secondary | ICD-10-CM | POA: Diagnosis not present

## 2020-06-27 DIAGNOSIS — M25562 Pain in left knee: Secondary | ICD-10-CM | POA: Diagnosis not present

## 2020-06-27 DIAGNOSIS — M25561 Pain in right knee: Secondary | ICD-10-CM | POA: Diagnosis not present

## 2020-06-27 DIAGNOSIS — G8929 Other chronic pain: Secondary | ICD-10-CM | POA: Diagnosis not present

## 2020-07-16 DIAGNOSIS — M47816 Spondylosis without myelopathy or radiculopathy, lumbar region: Secondary | ICD-10-CM | POA: Diagnosis not present

## 2020-07-16 DIAGNOSIS — M170 Bilateral primary osteoarthritis of knee: Secondary | ICD-10-CM | POA: Diagnosis not present

## 2020-07-24 ENCOUNTER — Other Ambulatory Visit: Payer: Medicare Other

## 2020-07-24 ENCOUNTER — Ambulatory Visit: Payer: Medicare Other | Admitting: Oncology

## 2020-08-15 DIAGNOSIS — M170 Bilateral primary osteoarthritis of knee: Secondary | ICD-10-CM | POA: Diagnosis not present

## 2020-09-03 DIAGNOSIS — E538 Deficiency of other specified B group vitamins: Secondary | ICD-10-CM | POA: Diagnosis not present

## 2020-09-03 DIAGNOSIS — N183 Chronic kidney disease, stage 3 unspecified: Secondary | ICD-10-CM | POA: Diagnosis not present

## 2020-09-03 DIAGNOSIS — I2510 Atherosclerotic heart disease of native coronary artery without angina pectoris: Secondary | ICD-10-CM | POA: Diagnosis not present

## 2020-09-03 DIAGNOSIS — D469 Myelodysplastic syndrome, unspecified: Secondary | ICD-10-CM | POA: Diagnosis not present

## 2020-09-16 ENCOUNTER — Other Ambulatory Visit: Payer: Self-pay | Admitting: Cardiology

## 2020-09-18 DIAGNOSIS — M11261 Other chondrocalcinosis, right knee: Secondary | ICD-10-CM | POA: Diagnosis not present

## 2020-09-18 DIAGNOSIS — M1711 Unilateral primary osteoarthritis, right knee: Secondary | ICD-10-CM | POA: Diagnosis not present

## 2020-10-04 DIAGNOSIS — N183 Chronic kidney disease, stage 3 unspecified: Secondary | ICD-10-CM | POA: Diagnosis not present

## 2020-10-16 DIAGNOSIS — M47816 Spondylosis without myelopathy or radiculopathy, lumbar region: Secondary | ICD-10-CM | POA: Diagnosis not present

## 2020-10-16 DIAGNOSIS — G8929 Other chronic pain: Secondary | ICD-10-CM | POA: Diagnosis not present

## 2020-10-16 DIAGNOSIS — M5136 Other intervertebral disc degeneration, lumbar region: Secondary | ICD-10-CM | POA: Diagnosis not present

## 2020-10-16 DIAGNOSIS — M25562 Pain in left knee: Secondary | ICD-10-CM | POA: Diagnosis not present

## 2020-10-16 DIAGNOSIS — M25561 Pain in right knee: Secondary | ICD-10-CM | POA: Diagnosis not present

## 2020-11-12 DIAGNOSIS — H2703 Aphakia, bilateral: Secondary | ICD-10-CM | POA: Diagnosis not present

## 2020-11-12 DIAGNOSIS — H3530 Unspecified macular degeneration: Secondary | ICD-10-CM | POA: Diagnosis not present

## 2020-11-18 DIAGNOSIS — M25562 Pain in left knee: Secondary | ICD-10-CM | POA: Diagnosis not present

## 2020-11-18 DIAGNOSIS — M5136 Other intervertebral disc degeneration, lumbar region: Secondary | ICD-10-CM | POA: Diagnosis not present

## 2020-11-18 DIAGNOSIS — M25561 Pain in right knee: Secondary | ICD-10-CM | POA: Diagnosis not present

## 2020-11-18 DIAGNOSIS — G8929 Other chronic pain: Secondary | ICD-10-CM | POA: Diagnosis not present

## 2020-11-18 DIAGNOSIS — M47816 Spondylosis without myelopathy or radiculopathy, lumbar region: Secondary | ICD-10-CM | POA: Diagnosis not present

## 2020-12-30 DIAGNOSIS — Z23 Encounter for immunization: Secondary | ICD-10-CM | POA: Diagnosis not present

## 2021-01-23 DIAGNOSIS — Z1331 Encounter for screening for depression: Secondary | ICD-10-CM | POA: Diagnosis not present

## 2021-01-23 DIAGNOSIS — E785 Hyperlipidemia, unspecified: Secondary | ICD-10-CM | POA: Diagnosis not present

## 2021-01-23 DIAGNOSIS — Z0000 Encounter for general adult medical examination without abnormal findings: Secondary | ICD-10-CM | POA: Diagnosis not present

## 2021-01-23 DIAGNOSIS — Z9181 History of falling: Secondary | ICD-10-CM | POA: Diagnosis not present

## 2021-02-07 DIAGNOSIS — D2239 Melanocytic nevi of other parts of face: Secondary | ICD-10-CM | POA: Diagnosis not present

## 2021-02-07 DIAGNOSIS — L570 Actinic keratosis: Secondary | ICD-10-CM | POA: Diagnosis not present

## 2021-02-07 DIAGNOSIS — D225 Melanocytic nevi of trunk: Secondary | ICD-10-CM | POA: Diagnosis not present

## 2021-02-07 DIAGNOSIS — D485 Neoplasm of uncertain behavior of skin: Secondary | ICD-10-CM | POA: Diagnosis not present

## 2021-02-07 DIAGNOSIS — L821 Other seborrheic keratosis: Secondary | ICD-10-CM | POA: Diagnosis not present

## 2021-02-17 DIAGNOSIS — C4441 Basal cell carcinoma of skin of scalp and neck: Secondary | ICD-10-CM | POA: Diagnosis not present

## 2021-02-17 DIAGNOSIS — C44219 Basal cell carcinoma of skin of left ear and external auricular canal: Secondary | ICD-10-CM | POA: Diagnosis not present

## 2021-02-18 DIAGNOSIS — C44529 Squamous cell carcinoma of skin of other part of trunk: Secondary | ICD-10-CM | POA: Diagnosis not present

## 2021-02-22 DIAGNOSIS — C44519 Basal cell carcinoma of skin of other part of trunk: Secondary | ICD-10-CM | POA: Diagnosis not present

## 2021-02-22 DIAGNOSIS — C44319 Basal cell carcinoma of skin of other parts of face: Secondary | ICD-10-CM | POA: Diagnosis not present

## 2021-03-05 DIAGNOSIS — G609 Hereditary and idiopathic neuropathy, unspecified: Secondary | ICD-10-CM | POA: Diagnosis not present

## 2021-03-05 DIAGNOSIS — N183 Chronic kidney disease, stage 3 unspecified: Secondary | ICD-10-CM | POA: Diagnosis not present

## 2021-03-05 DIAGNOSIS — Z6824 Body mass index (BMI) 24.0-24.9, adult: Secondary | ICD-10-CM | POA: Diagnosis not present

## 2021-03-05 DIAGNOSIS — I10 Essential (primary) hypertension: Secondary | ICD-10-CM | POA: Diagnosis not present

## 2021-03-05 DIAGNOSIS — I2510 Atherosclerotic heart disease of native coronary artery without angina pectoris: Secondary | ICD-10-CM | POA: Diagnosis not present

## 2021-03-05 DIAGNOSIS — R269 Unspecified abnormalities of gait and mobility: Secondary | ICD-10-CM | POA: Diagnosis not present

## 2021-03-05 DIAGNOSIS — M1710 Unilateral primary osteoarthritis, unspecified knee: Secondary | ICD-10-CM | POA: Diagnosis not present

## 2021-03-05 DIAGNOSIS — D469 Myelodysplastic syndrome, unspecified: Secondary | ICD-10-CM | POA: Diagnosis not present

## 2021-03-05 DIAGNOSIS — I69359 Hemiplegia and hemiparesis following cerebral infarction affecting unspecified side: Secondary | ICD-10-CM | POA: Diagnosis not present

## 2021-03-05 DIAGNOSIS — E538 Deficiency of other specified B group vitamins: Secondary | ICD-10-CM | POA: Diagnosis not present

## 2021-03-20 ENCOUNTER — Ambulatory Visit: Payer: Medicare Other | Admitting: Cardiology

## 2021-03-20 ENCOUNTER — Other Ambulatory Visit: Payer: Self-pay

## 2021-03-20 ENCOUNTER — Encounter: Payer: Self-pay | Admitting: Cardiology

## 2021-03-20 VITALS — BP 120/66 | HR 71 | Temp 98.0°F | Resp 16 | Ht 71.0 in | Wt 175.4 lb

## 2021-03-20 DIAGNOSIS — E7800 Pure hypercholesterolemia, unspecified: Secondary | ICD-10-CM

## 2021-03-20 DIAGNOSIS — I10 Essential (primary) hypertension: Secondary | ICD-10-CM

## 2021-03-20 DIAGNOSIS — Z951 Presence of aortocoronary bypass graft: Secondary | ICD-10-CM

## 2021-03-20 DIAGNOSIS — N1832 Chronic kidney disease, stage 3b: Secondary | ICD-10-CM

## 2021-03-20 DIAGNOSIS — Z8774 Personal history of (corrected) congenital malformations of heart and circulatory system: Secondary | ICD-10-CM

## 2021-03-20 DIAGNOSIS — I2510 Atherosclerotic heart disease of native coronary artery without angina pectoris: Secondary | ICD-10-CM

## 2021-03-20 MED ORDER — ASPIRIN 81 MG PO CHEW: 81.0000 mg | CHEWABLE_TABLET | Freq: Every day | ORAL | Status: DC

## 2021-03-20 NOTE — Progress Notes (Signed)
Primary Physician/Referring:  Cyndi Bender, PA-C  Patient ID: Aaron Whitaker, male    DOB: 14-Sep-1940, 80 y.o.   MRN: [id number]  Chief Complaint  Patient presents with   Hyperlipidemia   Coronary Artery Disease   Follow-up    1 year   HPI:    HPI: Aaron Whitaker  is a 80 y.o. Caucasian male with history of hypertension, hyperlipidemia, and history of PFO closure in 2012 for secondary prevention of stroke, as well as CAD with history of MI/CABG (bypass x5) on 03/29/06 with LIMA to LAD, SVG to OM1 and OM 2, SVG to PDA and PLA during which he suffered a perioperative stroke.  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 A 30 mm Amplatzer PFO septal occluder was deployed, with successful repair of the residual ASD. Double contrast study revealed no residual shunting. Loop recorder evaluated for 2 years did not reveal A. Fib.  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  He is now discharged from oncology.  Except for severe pain in his bilateral knee, he has no specific complaints today.     Past Medical History:  Diagnosis Date   Arthritis    Blood transfusion    Heart disease    Hx of CABG    Hyperlipidemia    Hypertension    Hyperthyroidism    Lower GI bleeding 1987   "bleeding ulcers; got 6 pints of blood"   Macular degeneration    bilateral   MI (myocardial infarction) ([place]) 03/2006   Pernicious anemia    Prostate cancer ([place]) ~2005   S/P radiation; seed implants   Skin cancer    Stroke ([place]) 2007; 12/2010, 03/2015   "light"; denies residuals, July 31,2016 most recent   Ulcerative colitis    Past Surgical History:  Procedure Laterality Date   CARDIAC CATHETERIZATION N/A 09/10/2015   Procedure: ASD/VSD Closure;  Surgeon: [name],  MD;  Location: [place] [facility];  Service: Cardiovascular;  Laterality: N/A;   CORONARY ARTERY BYPASS GRAFT  02/2006   CABG X 5   EP IMPLANTABLE DEVICE N/A 04/03/2015   Procedure: Loop Recorder Insertion;  Surgeon: Evans Lance, MD;  Location: [place]  CV LAB;  Service: Cardiovascular;  Laterality: N/A;   GASTRIC RESECTION  ~ 1987   "had ~ 90% of my stomach removed"   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   KNEE ARTHROSCOPY     bilaterally; "long long time ago" (11/17/11)   PATENT FORAMEN OVALE CLOSURE  03/2015   PATENT FORAMEN OVALE CLOSURE N/A 11/17/2011   Procedure: PATENT FORAMEN OVALE CLOSURE;  Surgeon: Laverda Page, MD;  Location: [place] CATH LAB;  Service: Cardiovascular;  Laterality: N/A;   TEE WITHOUT CARDIOVERSION N/A 04/03/2015   Procedure: TRANSESOPHAGEAL ECHOCARDIOGRAM (TEE);  Surgeon: Lelon Perla, MD;  Location: [facility] ENDOSCOPY;  Service: Cardiovascular;  Laterality: N/A;   Social History   Tobacco Use   Smoking status: Former    Packs/day: 0.50    Years: 8.00    Pack years: 4.00    Types: Cigarettes    Quit date: 08/31/1965    Years since quitting: 55.5   Smokeless tobacco: Current    Types: Chew   Tobacco comments:  2 pouches daily  Substance Use Topics   Alcohol use: No    Alcohol/week: 0.0 standard drinks  Marital Status: Married   ROS  Review of Systems  Cardiovascular:  Negative for chest pain, dyspnea on exertion and leg swelling.  Musculoskeletal:  Positive for joint pain.  Gastrointestinal:  Negative for melena.  Objective  Blood pressure 120/66, pulse 71, temperature 98 F (36.7 C), temperature source Temporal, resp. rate 16, height '5\' 11"'  (1.803 m), weight 175 lb 6.4 oz (79.6 kg), SpO2 98 %. Body mass index is 24.46 kg/m. Vitals with BMI 03/20/2021 03/21/2020 03/22/2019  Height '5\' 11"'  '5\' 11"'  '5\' 11"'   Weight 175 lbs 6 oz 178 lbs 185 lbs  BMI 24.47 88.50 27.74  Systolic 128 786 767  Diastolic 66 70 69  Pulse 71 63 54    Physical  Exam Constitutional:      General: He is not in acute distress.    Appearance: Normal appearance. He is well-developed.  Neck:     Thyroid: No thyromegaly.     Vascular: No JVD.  Cardiovascular:     Rate and Rhythm: Normal rate and regular rhythm.     Pulses: Intact distal pulses.     Heart sounds: Normal heart sounds. No murmur heard.   No gallop.  Pulmonary:     Effort: Pulmonary effort is normal.     Breath sounds: Normal breath sounds.  Abdominal:     General: Bowel sounds are normal.     Palpations: Abdomen is soft.   Radiology: No results found.  Laboratory examination:    External labs:  Labs 09/03/2020:  Total cholesterol 120, triglycerides 110, HDL 36, LDL 64.  Serum glucose 85 mg, BUN 20, creatinine 1.88, EGFR 33 mL, sodium 141, potassium 4.6.  CMP otherwise normal.  Hb 13.5/HCT 40.2, platelets 224.  Creatinine, Serum 1.760 mg/ 03/05/2020   Medications   Current Outpatient Medications  Medication Instructions   amLODipine (NORVASC) 5 mg, Oral, Daily   aspirin (ASPIRIN CHILDRENS) 81 mg, Oral, Daily   carvedilol (COREG) 3.125 MG tablet TAKE 1 TABLET TWICE DAILY   gabapentin (NEURONTIN) 100 MG capsule 2 times daily   Multiple Vitamin (MULTIVITAMIN) capsule 1 capsule, Daily   Multiple Vitamins-Minerals (PRESERVISION AREDS 2 PO) Oral, 2 times daily before meals   rosuvastatin (CRESTOR) 40 MG tablet TAKE 1 TABLET EVERY DAY   Cardiac Studies:   Echocardiogram 09/11/2015 :   Left ventricle: The cavity size was normal. There was mild focal   basal hypertrophy of the septum. Systolic function was normal.   The estimated ejection fraction was in the range of 50% to 55%.   Regional wall motion abnormalities cannot be excluded. The study   is not technically sufficient to allow evaluation of LV diastolic   function. - Aortic valve: There was mild regurgitation. - Right ventricle: The cavity size was mildly dilated. - Atrial septum: There is a atrial septal occlude in  situ. No residual shunting by color Doppler evaluation. The occluder   appears to be in good position. the right atrial disk appears   slightly splayed.  EKG 03/20/2021: Sinus bradycardia with first-degree block at rate of 60 bpm, normal axis, possible inferior infarct old.  Nonspecific inferior T abnormality inferior wall.  Single PAC.  Compared to 03/21/2020.  Assessment     ICD-10-CM   1. Coronary artery disease involving native coronary artery of native heart without angina pectoris  I25.10 EKG 12-Lead    aspirin (ASPIRIN CHILDRENS) 81 MG  chewable tablet    2. Hx of CABG  Z95.1     3. Primary hypertension  I10     4. H/O congenital atrial septal defect (ASD) repair  Z87.74     5. Hypercholesteremia  E78.00     6. Stage 3b chronic kidney disease (HCC)  N18.32       Medications Discontinued During This Encounter  Medication Reason   clopidogrel (PLAVIX) 75 MG tablet Completed Course    Meds ordered this encounter  Medications   aspirin (ASPIRIN CHILDRENS) 81 MG chewable tablet    Sig: Chew 1 tablet (81 mg total) by mouth daily.    Recommendations:   Aaron Whitaker  is a 80 y.o. Caucasian male with history of hypertension, hyperlipidemia, and CAD with history of MI/CABG (bypass x5) on 03/29/06 with LIMA to LAD, SVG to OM1 and OM 2, SVG to PDA and PLA during which he suffered a perioperative stroke,  history of PFO closure in 2012 for secondary prevention of stroke. 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 A 30 mm Amplatzer PFO septal occluder, . Loop recorder evaluated for 2 years did not reveal A. Fib.  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  He is now discharged from oncology.  Except for severe pain in his bilateral knee, he has no  specific complaints today.  I reviewed his external labs, lipids under excellent control.  Renal function has remained stable although patient brings in a handwritten note stating that the renal function is slightly deteriorated from stage IIIb to stage IV.  I do not see any medications that could be doing this.  In view of his anemia, no recurrence of any cardiac events, will discontinue Plavix and switch him to aspirin 81 mg daily.  Stable from cardiac standpoint, I will see him back in a year.     [name], MD, [facility] 03/20/2021, 1:49 PM Office: [phone]

## 2021-03-21 ENCOUNTER — Ambulatory Visit: Payer: Medicare Other | Admitting: Cardiology

## 2021-04-02 DIAGNOSIS — H3530 Unspecified macular degeneration: Secondary | ICD-10-CM | POA: Diagnosis not present

## 2021-04-02 DIAGNOSIS — H2703 Aphakia, bilateral: Secondary | ICD-10-CM | POA: Diagnosis not present

## 2021-04-07 DIAGNOSIS — N183 Chronic kidney disease, stage 3 unspecified: Secondary | ICD-10-CM | POA: Diagnosis not present

## 2021-04-15 DIAGNOSIS — M1712 Unilateral primary osteoarthritis, left knee: Secondary | ICD-10-CM | POA: Diagnosis not present

## 2021-04-15 DIAGNOSIS — M1711 Unilateral primary osteoarthritis, right knee: Secondary | ICD-10-CM | POA: Diagnosis not present

## 2021-04-15 DIAGNOSIS — N183 Chronic kidney disease, stage 3 unspecified: Secondary | ICD-10-CM | POA: Diagnosis not present

## 2021-04-15 DIAGNOSIS — Z6824 Body mass index (BMI) 24.0-24.9, adult: Secondary | ICD-10-CM | POA: Diagnosis not present

## 2021-04-15 DIAGNOSIS — Z139 Encounter for screening, unspecified: Secondary | ICD-10-CM | POA: Diagnosis not present

## 2021-04-25 DIAGNOSIS — I129 Hypertensive chronic kidney disease with stage 1 through stage 4 chronic kidney disease, or unspecified chronic kidney disease: Secondary | ICD-10-CM | POA: Diagnosis not present

## 2021-04-25 DIAGNOSIS — N184 Chronic kidney disease, stage 4 (severe): Secondary | ICD-10-CM | POA: Diagnosis not present

## 2021-04-25 DIAGNOSIS — D631 Anemia in chronic kidney disease: Secondary | ICD-10-CM | POA: Diagnosis not present

## 2021-04-25 DIAGNOSIS — N390 Urinary tract infection, site not specified: Secondary | ICD-10-CM | POA: Diagnosis not present

## 2021-04-25 DIAGNOSIS — I2510 Atherosclerotic heart disease of native coronary artery without angina pectoris: Secondary | ICD-10-CM | POA: Diagnosis not present

## 2021-04-25 DIAGNOSIS — N1832 Chronic kidney disease, stage 3b: Secondary | ICD-10-CM | POA: Diagnosis not present

## 2021-04-25 DIAGNOSIS — N189 Chronic kidney disease, unspecified: Secondary | ICD-10-CM | POA: Diagnosis not present

## 2021-04-28 ENCOUNTER — Other Ambulatory Visit: Payer: Self-pay | Admitting: Nephrology

## 2021-04-28 DIAGNOSIS — N184 Chronic kidney disease, stage 4 (severe): Secondary | ICD-10-CM

## 2021-04-29 ENCOUNTER — Other Ambulatory Visit: Payer: Self-pay | Admitting: Nephrology

## 2021-04-29 DIAGNOSIS — I129 Hypertensive chronic kidney disease with stage 1 through stage 4 chronic kidney disease, or unspecified chronic kidney disease: Secondary | ICD-10-CM

## 2021-04-29 DIAGNOSIS — N184 Chronic kidney disease, stage 4 (severe): Secondary | ICD-10-CM

## 2021-05-19 ENCOUNTER — Ambulatory Visit
Admission: RE | Admit: 2021-05-19 | Discharge: 2021-05-19 | Disposition: A | Discharge status: Home / Self Care | Payer: Medicare Other | Source: Ambulatory Visit | Attending: Nephrology | Admitting: Nephrology

## 2021-05-19 DIAGNOSIS — I129 Hypertensive chronic kidney disease with stage 1 through stage 4 chronic kidney disease, or unspecified chronic kidney disease: Secondary | ICD-10-CM

## 2021-05-19 DIAGNOSIS — N189 Chronic kidney disease, unspecified: Secondary | ICD-10-CM | POA: Diagnosis not present

## 2021-05-19 DIAGNOSIS — N281 Cyst of kidney, acquired: Secondary | ICD-10-CM | POA: Diagnosis not present

## 2021-05-19 DIAGNOSIS — N184 Chronic kidney disease, stage 4 (severe): Secondary | ICD-10-CM

## 2021-05-23 DIAGNOSIS — L570 Actinic keratosis: Secondary | ICD-10-CM | POA: Diagnosis not present

## 2021-05-23 DIAGNOSIS — D2239 Melanocytic nevi of other parts of face: Secondary | ICD-10-CM | POA: Diagnosis not present

## 2021-05-23 DIAGNOSIS — L820 Inflamed seborrheic keratosis: Secondary | ICD-10-CM | POA: Diagnosis not present

## 2021-05-23 DIAGNOSIS — D485 Neoplasm of uncertain behavior of skin: Secondary | ICD-10-CM | POA: Diagnosis not present

## 2021-05-23 DIAGNOSIS — D225 Melanocytic nevi of trunk: Secondary | ICD-10-CM | POA: Diagnosis not present

## 2021-05-23 DIAGNOSIS — L821 Other seborrheic keratosis: Secondary | ICD-10-CM | POA: Diagnosis not present

## 2021-05-26 DIAGNOSIS — N184 Chronic kidney disease, stage 4 (severe): Secondary | ICD-10-CM | POA: Diagnosis not present

## 2021-06-15 ENCOUNTER — Other Ambulatory Visit: Payer: Self-pay | Admitting: Cardiology

## 2021-06-23 DIAGNOSIS — R809 Proteinuria, unspecified: Secondary | ICD-10-CM | POA: Diagnosis not present

## 2021-06-27 DIAGNOSIS — D0462 Carcinoma in situ of skin of left upper limb, including shoulder: Secondary | ICD-10-CM | POA: Diagnosis not present

## 2021-06-30 DIAGNOSIS — I129 Hypertensive chronic kidney disease with stage 1 through stage 4 chronic kidney disease, or unspecified chronic kidney disease: Secondary | ICD-10-CM | POA: Diagnosis not present

## 2021-06-30 DIAGNOSIS — R809 Proteinuria, unspecified: Secondary | ICD-10-CM | POA: Diagnosis not present

## 2021-06-30 DIAGNOSIS — Z23 Encounter for immunization: Secondary | ICD-10-CM | POA: Diagnosis not present

## 2021-06-30 DIAGNOSIS — N184 Chronic kidney disease, stage 4 (severe): Secondary | ICD-10-CM | POA: Diagnosis not present

## 2021-06-30 DIAGNOSIS — D631 Anemia in chronic kidney disease: Secondary | ICD-10-CM | POA: Diagnosis not present

## 2021-07-01 DIAGNOSIS — N184 Chronic kidney disease, stage 4 (severe): Secondary | ICD-10-CM | POA: Diagnosis not present

## 2021-07-03 DIAGNOSIS — Z23 Encounter for immunization: Secondary | ICD-10-CM | POA: Diagnosis not present

## 2021-07-08 DIAGNOSIS — M170 Bilateral primary osteoarthritis of knee: Secondary | ICD-10-CM | POA: Diagnosis not present

## 2021-07-11 DIAGNOSIS — D045 Carcinoma in situ of skin of trunk: Secondary | ICD-10-CM | POA: Diagnosis not present

## 2021-07-11 DIAGNOSIS — C44519 Basal cell carcinoma of skin of other part of trunk: Secondary | ICD-10-CM | POA: Diagnosis not present

## 2021-08-06 DIAGNOSIS — Z6822 Body mass index (BMI) 22.0-22.9, adult: Secondary | ICD-10-CM | POA: Diagnosis not present

## 2021-08-06 DIAGNOSIS — I2510 Atherosclerotic heart disease of native coronary artery without angina pectoris: Secondary | ICD-10-CM | POA: Diagnosis not present

## 2021-08-06 DIAGNOSIS — I69359 Hemiplegia and hemiparesis following cerebral infarction affecting unspecified side: Secondary | ICD-10-CM | POA: Diagnosis not present

## 2021-08-06 DIAGNOSIS — M1711 Unilateral primary osteoarthritis, right knee: Secondary | ICD-10-CM | POA: Diagnosis not present

## 2021-08-06 DIAGNOSIS — N184 Chronic kidney disease, stage 4 (severe): Secondary | ICD-10-CM | POA: Diagnosis not present

## 2021-08-06 DIAGNOSIS — Z131 Encounter for screening for diabetes mellitus: Secondary | ICD-10-CM | POA: Diagnosis not present

## 2021-08-06 DIAGNOSIS — D469 Myelodysplastic syndrome, unspecified: Secondary | ICD-10-CM | POA: Diagnosis not present

## 2021-08-06 DIAGNOSIS — E538 Deficiency of other specified B group vitamins: Secondary | ICD-10-CM | POA: Diagnosis not present

## 2021-08-06 DIAGNOSIS — G609 Hereditary and idiopathic neuropathy, unspecified: Secondary | ICD-10-CM | POA: Diagnosis not present

## 2021-08-06 DIAGNOSIS — Z01818 Encounter for other preprocedural examination: Secondary | ICD-10-CM | POA: Diagnosis not present

## 2021-08-06 DIAGNOSIS — I10 Essential (primary) hypertension: Secondary | ICD-10-CM | POA: Diagnosis not present

## 2021-08-08 DIAGNOSIS — N184 Chronic kidney disease, stage 4 (severe): Secondary | ICD-10-CM | POA: Diagnosis not present

## 2021-09-05 DIAGNOSIS — M1711 Unilateral primary osteoarthritis, right knee: Secondary | ICD-10-CM | POA: Diagnosis not present

## 2021-09-05 DIAGNOSIS — Z6822 Body mass index (BMI) 22.0-22.9, adult: Secondary | ICD-10-CM | POA: Diagnosis not present

## 2021-09-05 DIAGNOSIS — G609 Hereditary and idiopathic neuropathy, unspecified: Secondary | ICD-10-CM | POA: Diagnosis not present

## 2021-09-05 DIAGNOSIS — R32 Unspecified urinary incontinence: Secondary | ICD-10-CM | POA: Diagnosis not present

## 2021-09-05 DIAGNOSIS — N184 Chronic kidney disease, stage 4 (severe): Secondary | ICD-10-CM | POA: Diagnosis not present

## 2021-09-08 DIAGNOSIS — R32 Unspecified urinary incontinence: Secondary | ICD-10-CM | POA: Diagnosis not present

## 2021-09-19 ENCOUNTER — Other Ambulatory Visit: Payer: Self-pay | Admitting: Cardiology

## 2021-09-19 ENCOUNTER — Encounter: Payer: Self-pay | Admitting: Cardiology

## 2021-10-01 ENCOUNTER — Other Ambulatory Visit: Payer: Self-pay | Admitting: Orthopedic Surgery

## 2021-10-01 DIAGNOSIS — G8929 Other chronic pain: Secondary | ICD-10-CM

## 2021-10-17 DIAGNOSIS — D485 Neoplasm of uncertain behavior of skin: Secondary | ICD-10-CM | POA: Diagnosis not present

## 2021-10-17 DIAGNOSIS — C44519 Basal cell carcinoma of skin of other part of trunk: Secondary | ICD-10-CM | POA: Diagnosis not present

## 2021-10-17 DIAGNOSIS — L821 Other seborrheic keratosis: Secondary | ICD-10-CM | POA: Diagnosis not present

## 2021-10-17 DIAGNOSIS — L570 Actinic keratosis: Secondary | ICD-10-CM | POA: Diagnosis not present

## 2021-10-17 DIAGNOSIS — D045 Carcinoma in situ of skin of trunk: Secondary | ICD-10-CM | POA: Diagnosis not present

## 2021-10-17 DIAGNOSIS — L820 Inflamed seborrheic keratosis: Secondary | ICD-10-CM | POA: Diagnosis not present

## 2021-10-21 ENCOUNTER — Encounter (HOSPITAL_COMMUNITY)
Admission: RE | Admit: 2021-10-21 | Discharge: 2021-10-21 | Disposition: A | Discharge status: Home / Self Care | Payer: Medicare Other | Source: Ambulatory Visit | Attending: Orthopedic Surgery | Admitting: Orthopedic Surgery

## 2021-10-21 ENCOUNTER — Encounter (HOSPITAL_COMMUNITY): Payer: Self-pay

## 2021-10-21 ENCOUNTER — Other Ambulatory Visit: Payer: Self-pay

## 2021-10-21 VITALS — BP 111/64 | HR 93 | Temp 98.6°F | Resp 14 | Ht 70.5 in | Wt 160.2 lb

## 2021-10-21 DIAGNOSIS — G8929 Other chronic pain: Secondary | ICD-10-CM | POA: Diagnosis not present

## 2021-10-21 DIAGNOSIS — Z01812 Encounter for preprocedural laboratory examination: Secondary | ICD-10-CM | POA: Insufficient documentation

## 2021-10-21 DIAGNOSIS — M25561 Pain in right knee: Secondary | ICD-10-CM | POA: Insufficient documentation

## 2021-10-21 DIAGNOSIS — Z20822 Contact with and (suspected) exposure to covid-19: Secondary | ICD-10-CM | POA: Diagnosis not present

## 2021-10-21 DIAGNOSIS — Z01818 Encounter for other preprocedural examination: Secondary | ICD-10-CM

## 2021-10-21 HISTORY — DX: Personal history of urinary calculi: Z87.442

## 2021-10-21 LAB — COMPREHENSIVE METABOLIC PANEL
ALT: 26 U/L (ref 0–44)
AST: 22 U/L (ref 15–41)
Albumin: 3.6 g/dL (ref 3.5–5.0)
Alkaline Phosphatase: 94 U/L (ref 38–126)
Anion gap: 6 (ref 5–15)
BUN: 27 mg/dL — ABNORMAL HIGH (ref 8–23)
CO2: 23 mmol/L (ref 22–32)
Calcium: 9.1 mg/dL (ref 8.9–10.3)
Chloride: 108 mmol/L (ref 98–111)
Creatinine, Ser: 2.22 mg/dL — ABNORMAL HIGH (ref 0.61–1.24)
GFR, Estimated: 29 mL/min — ABNORMAL LOW (ref >60)
Glucose, Bld: 197 mg/dL — ABNORMAL HIGH (ref 70–99)
Potassium: 3.7 mmol/L (ref 3.5–5.1)
Sodium: 137 mmol/L (ref 135–145)
Total Bilirubin: 0.3 mg/dL (ref 0.3–1.2)
Total Protein: 6.9 g/dL (ref 6.5–8.1)

## 2021-10-21 LAB — CBC WITH DIFFERENTIAL/PLATELET
Abs Immature Granulocytes: 0.02 10*3/uL (ref 0.00–0.07)
Basophils Absolute: 0.1 10*3/uL (ref 0.0–0.1)
Basophils Relative: 1 %
Eosinophils Absolute: 0.2 10*3/uL (ref 0.0–0.5)
Eosinophils Relative: 3 %
HCT: 32.9 % — ABNORMAL LOW (ref 39.0–52.0)
Hemoglobin: 10.5 g/dL — ABNORMAL LOW (ref 13.0–17.0)
Immature Granulocytes: 0 %
Lymphocytes Relative: 13 %
Lymphs Abs: 0.8 10*3/uL (ref 0.7–4.0)
MCH: 31.1 pg (ref 26.0–34.0)
MCHC: 31.9 g/dL (ref 30.0–36.0)
MCV: 97.3 fL (ref 80.0–100.0)
Monocytes Absolute: 0.4 10*3/uL (ref 0.1–1.0)
Monocytes Relative: 7 %
Neutro Abs: 5.1 10*3/uL (ref 1.7–7.7)
Neutrophils Relative %: 76 %
Platelets: 227 10*3/uL (ref 150–400)
RBC: 3.38 MIL/uL — ABNORMAL LOW (ref 4.22–5.81)
RDW: 13.3 % (ref 11.5–15.5)
WBC: 6.7 10*3/uL (ref 4.0–10.5)
nRBC: 0 % (ref 0.0–0.2)

## 2021-10-21 LAB — SURGICAL PCR SCREEN
MRSA, PCR: NEGATIVE
Staphylococcus aureus: NEGATIVE

## 2021-10-21 NOTE — Progress Notes (Signed)
Creatinine 2.22, resutls routed to Dr. Ronnie Derby.

## 2021-10-21 NOTE — Progress Notes (Addendum)
COVID swab appointment:10/30/21 @ 0945  COVID Vaccine Completed: yes x2 Date COVID Vaccine completed: Has received booster: COVID vaccine manufacturer: [place]   Date of COVID positive in last 90 days: no  PCP - Cyndi Bender, PA Cardiologist - [name], MD  Chest x-ray - n/a EKG - 03/20/21 Epic  Stress Test - n/a ECHO - 09/11/15 Epic Cardiac Cath - many years per pt Pacemaker/ICD device last checked: n/a Spinal Cord Stimulator: pt denies  Bowel Prep - no  Sleep Study - n/a CPAP -   Fasting Blood Sugar - n/a Checks Blood Sugar _____ times a day  Blood Thinner Instructions: Plavix, no instructions, pt will call cardiologist Aspirin Instructions: ASA 81, no instructions Last Dose:  Activity level: Can go up a flight of stairs and perform activities of daily living without stopping and without symptoms of chest pain or shortness of breath.     Anesthesia review: CAD, HTN, ASD, stroke with hemiparesis, CABG, loop recorder, red cell aplastic anemia, CKD, creatinine 2.22  Patient denies shortness of breath, fever, cough and chest pain at PAT appointment   Patient verbalized understanding of instructions that were given to them at the PAT appointment. Patient was also instructed that they will need to review over the PAT instructions again at home before surgery.

## 2021-10-21 NOTE — Patient Instructions (Addendum)
DUE TO COVID-19 ONLY ONE VISITOR IS ALLOWED TO COME WITH YOU AND STAY IN THE WAITING ROOM ONLY DURING PRE OP AND PROCEDURE.   **NO VISITORS ARE ALLOWED IN THE SHORT STAY AREA OR RECOVERY ROOM!!**  IF YOU WILL BE ADMITTED INTO THE HOSPITAL YOU ARE ALLOWED ONLY TWO SUPPORT PEOPLE DURING VISITATION HOURS ONLY (7 AM -8PM)   The support person(s) must pass our screening, gel in and out, and wear a mask at all times, including in the patients room. Patients must also wear a mask when staff or their support person are in the room. Visitors GUEST BADGE MUST BE WORN VISIBLY  One adult visitor may remain with you overnight and MUST be in the room by 8 P.M.  No visitors under the age of 37. Any visitor under the age of 66 must be accompanied by an adult.    COVID SWAB TESTING MUST BE COMPLETED ON:  10/30/21 @ 9:45 AM   Site: [facility] Enter: Main Entrance have a seat in the waiting area to the right of main entrance (DO NOT [place]!!!!!) Dial: [phone] to alert staff you have arrived  You are not required to quarantine, however you are required to wear a well-fitted mask when you are out and around people not in your household.  Hand Hygiene often Do NOT share personal items Notify your provider if you are in close contact with someone who has COVID or you develop fever 100.4 or greater, new onset of sneezing, cough, sore throat, shortness of breath or body aches.       Your procedure is scheduled on: 11/03/21   Report to [facility] Main Entrance    Report to admitting at 6:30 AM   Call this number if you have problems the morning of surgery [phone]   Do not eat food :After Midnight.   May have liquids until 6:15 AM day of surgery  CLEAR LIQUID DIET  Foods Allowed                                                                     Foods Excluded  Water, Black Coffee and tea, regular and decaf                              liquids that you cannot  Plain Jell-O in any flavor  (No red)                                           see through such as: Fruit ices (not with fruit pulp)                                     milk, soups, orange juice              Iced Popsicles (No red)  All solid food                                   Apple juices Sports drinks like Gatorade (No red) Lightly seasoned clear broth or consume(fat free) Sugar     The day of surgery:  Drink ONE (1) Pre-Surgery Clear Ensure at 6:15 AM the morning of surgery. Drink in one sitting. Do not sip.  This drink was given to you during your hospital  pre-op appointment visit. Nothing else to drink after completing the  Pre-Surgery Clear Ensure.          If you have questions, please contact your surgeons office.  FOLLOW BOWEL PREP AND ANY ADDITIONAL PRE OP INSTRUCTIONS YOU RECEIVED FROM YOUR SURGEON'S OFFICE!!!     Oral Hygiene is also important to reduce your risk of infection.                                    Remember - BRUSH YOUR TEETH THE MORNING OF SURGERY WITH YOUR REGULAR TOOTHPASTE   Stop all vitamins and supplements 7 days before surgery.   Ask your cardiologist for instructions regarding Aspirin and Plavix before surgery.   Take these medicines the morning of surgery with A SIP OF WATER: Amlodipine, Carvedilol, Gabapentin, Rosuvastatin                               You may not have any metal on your body including jewelry, and body piercing             Do not wear lotions, powders, cologne, or deodorant              Men may shave face and neck.   Do not bring valuables to the hospital. [place].   Contacts, dentures or bridgework may not be worn into surgery.   Bring small overnight bag day of surgery.   Special Instructions: Bring a copy of your healthcare power of attorney and living will documents         the day of surgery if you  haven't scanned them before.              Please read over the following fact sheets you were given: IF YOU HAVE QUESTIONS ABOUT YOUR PRE-OP INSTRUCTIONS PLEASE CALL [place] - Preparing for Surgery Before surgery, you can play an important role.  Because skin is not sterile, your skin needs to be as free of germs as possible.  You can reduce the number of germs on your skin by washing with CHG (chlorahexidine gluconate) soap before surgery.  CHG is an antiseptic cleaner which kills germs and bonds with the skin to continue killing germs even after washing. Please DO NOT use if you have an allergy to CHG or antibacterial soaps.  If your skin becomes reddened/irritated stop using the CHG and inform your nurse when you arrive at Short Stay. Do not shave (including legs and underarms) for at least 48 hours prior to the first CHG shower.  You may shave your face/neck.  Please follow these instructions carefully:  1.  Shower with CHG Soap the night  before surgery and the  morning of surgery.  2.  If you choose to wash your hair, wash your hair first as usual with your normal  shampoo.  3.  After you shampoo, rinse your hair and body thoroughly to remove the shampoo.                             4.  Use CHG as you would any other liquid soap.  You can apply chg directly to the skin and wash.  Gently with a scrungie or clean washcloth.  5.  Apply the CHG Soap to your body ONLY FROM THE NECK DOWN.   Do   not use on face/ open                           Wound or open sores. Avoid contact with eyes, ears mouth and   genitals (private parts).                       Wash face,  Genitals (private parts) with your normal soap.             6.  Wash thoroughly, paying special attention to the area where your    surgery  will be performed.  7.  Thoroughly rinse your body with warm water from the neck down.  8.  DO NOT shower/wash with your normal soap after using and rinsing off the CHG  Soap.                9.  Pat yourself dry with a clean towel.            10.  Wear clean pajamas.            11.  Place clean sheets on your bed the night of your first shower and do not  sleep with pets. Day of Surgery : Do not apply any lotions/deodorants the morning of surgery.  Please wear clean clothes to the hospital/surgery center.  FAILURE TO FOLLOW THESE INSTRUCTIONS MAY RESULT IN THE CANCELLATION OF YOUR SURGERY  PATIENT SIGNATURE_________________________________  NURSE SIGNATURE__________________________________  ________________________________________________________________________   Adam Phenix  An incentive spirometer is a tool that can help keep your lungs clear and active. This tool measures how well you are filling your lungs with each breath. Taking long deep breaths may help reverse or decrease the chance of developing breathing (pulmonary) problems (especially infection) following: A long period of time when you are unable to move or be active. BEFORE THE PROCEDURE  If the spirometer includes an indicator to show your best effort, your nurse or respiratory therapist will set it to a desired goal. If possible, sit up straight or lean slightly forward. Try not to slouch. Hold the incentive spirometer in an upright position. INSTRUCTIONS FOR USE  Sit on the edge of your bed if possible, or sit up as far as you can in bed or on a chair. Hold the incentive spirometer in an upright position. Breathe out normally. Place the mouthpiece in your mouth and seal your lips tightly around it. Breathe in slowly and as deeply as possible, raising the piston or the ball toward the top of the column. Hold your breath for 3-5 seconds or for as long as possible. Allow the piston or ball to fall to the bottom of the column. Remove the mouthpiece from your mouth and  breathe out normally. Rest for a few seconds and repeat Steps 1 through 7 at least 10 times every 1-2 hours when  you are awake. Take your time and take a few normal breaths between deep breaths. The spirometer may include an indicator to show your best effort. Use the indicator as a goal to work toward during each repetition. After each set of 10 deep breaths, practice coughing to be sure your lungs are clear. If you have an incision (the cut made at the time of surgery), support your incision when coughing by placing a pillow or rolled up towels firmly against it. Once you are able to get out of bed, walk around indoors and cough well. You may stop using the incentive spirometer when instructed by your caregiver.  RISKS AND COMPLICATIONS Take your time so you do not get dizzy or light-headed. If you are in pain, you may need to take or ask for pain medication before doing incentive spirometry. It is harder to take a deep breath if you are having pain. AFTER USE Rest and breathe slowly and easily. It can be helpful to keep track of a log of your progress. Your caregiver can provide you with a simple table to help with this. If you are using the spirometer at home, follow these instructions: [place] IF:  You are having difficultly using the spirometer. You have trouble using the spirometer as often as instructed. Your pain medication is not giving enough relief while using the spirometer. You develop fever of 100.5 F (38.1 C) or higher. SEEK IMMEDIATE MEDICAL CARE IF:  You cough up bloody sputum that had not been present before. You develop fever of 102 F (38.9 C) or greater. You develop worsening pain at or near the incision site. MAKE SURE YOU:  Understand these instructions. Will watch your condition. Will get help right away if you are not doing well or get worse. Document Released: 12/28/2006 Document Revised: 11/09/2011 Document Reviewed: 02/28/2007 [facility] Patient Information 2014 [place], [place].   ________________________________________________________________________

## 2021-10-27 NOTE — Progress Notes (Signed)
Anesthesia Chart Review   Case: [id number] Date/Time: 11/03/21 0900   Procedure: TOTAL KNEE ARTHROPLASTY (Right: Knee)   Anesthesia type: Spinal   Pre-op diagnosis: Osteoarthritis of right knee M17.11   Location: [facility] ROOM 07 / [facility]   Surgeons: Vickey Huger, MD       DISCUSSION:81 y.o. former smoker with h/o HTN, CAD (CABG), PFO closure 2012, stroke, CKD Stage IV followed by [place] per last OV note creatinine baseline 2-2.4, prostate cancer, right knee OA scheduled for above procedure 11/03/2021 with Dr. Vickey Huger.   Pt last seen by cardiology 03/20/2021. Per OV note, stable at this visit.  Plavix discontinued, pt on aspirin 81 mg daily. 1 year follow up recommended.   Anticipate pt can proceed with planned procedure barring acute status change.   VS: BP 111/64    Pulse 93    Temp 37 C (Oral)    Resp 14    Ht 5' 10.5" (1.791 m)    Wt 72.7 kg    SpO2 96%    BMI 22.66 kg/m   PROVIDERS: Cyndi Bender, PA-C is PCP   [name], MD is Cardiologist  LABS: Labs reviewed: Acceptable for surgery. (all labs ordered are listed, but only abnormal results are displayed)  Labs Reviewed  CBC WITH DIFFERENTIAL/PLATELET - Abnormal; Notable for the following components:      Result Value   RBC 3.38 (*)    Hemoglobin 10.5 (*)    HCT 32.9 (*)    All other components within normal limits  COMPREHENSIVE METABOLIC PANEL - Abnormal; Notable for the following components:   Glucose, Bld 197 (*)    BUN 27 (*)    Creatinine, Ser 2.22 (*)    GFR, Estimated 29 (*)    All other components within normal limits  SURGICAL PCR SCREEN     IMAGES:   EKG: EKG 03/20/2021: Sinus bradycardia with first-degree block at rate of 60 bpm, normal axis, possible inferior infarct old.  Nonspecific inferior T abnormality inferior wall.  Single PAC.  Compared to 03/21/2020.  CV: Echo 09/11/2015 - Left ventricle: The cavity size was normal. There was mild focal    basal hypertrophy of the septum.  Systolic function was normal.    The estimated ejection fraction was in the range of 50% to 55%.    Regional wall motion abnormalities cannot be excluded. The study    is not technically sufficient to allow evaluation of LV diastolic    function.  - Aortic valve: There was mild regurgitation.  - Right ventricle: The cavity size was mildly dilated.  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 No    residual shunting by color Doppler evaluation. The occluder    appears to be in good position. the right atrial disk appears    slightly splayed.  Past Medical History:  Diagnosis Date   Arthritis    Blood transfusion    Heart disease    History of kidney stones    Hx of CABG    Hyperlipidemia    Hypertension    Hyperthyroidism    Lower GI bleeding 1987   "bleeding ulcers; got 6 pints of blood"   Macular degeneration    bilateral   MI (myocardial infarction) ([place]) 03/2006   Pernicious anemia    Prostate cancer ([place]) ~2005   S/P radiation; seed implants   Skin cancer    Stroke ([place]) 2007; 12/2010, 03/2015   "light"; denies residuals, July 31,2016  most recent   Ulcerative colitis     Past Surgical History:  Procedure Laterality Date   CARDIAC CATHETERIZATION N/A 09/10/2015   Procedure: ASD/VSD Closure;  Surgeon: [name], MD;  Location: [place] CV LAB;  Service: Cardiovascular;  Laterality: N/A;   CORONARY ARTERY BYPASS GRAFT  02/2006   CABG X 5   EP IMPLANTABLE DEVICE N/A 04/03/2015   Procedure: Loop Recorder Insertion;  Surgeon: Evans Lance, MD;  Location: [place] CV LAB;  Service: Cardiovascular;  Laterality: N/A;   GASTRIC RESECTION  ~ 1987   "had ~ 90% of my stomach removed"   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   KNEE ARTHROSCOPY     bilaterally; "long long time ago" (11/17/11)   PATENT FORAMEN OVALE CLOSURE  03/2015   PATENT FORAMEN OVALE CLOSURE N/A 11/17/2011   Procedure: PATENT FORAMEN OVALE CLOSURE;  Surgeon: Laverda Page, MD;  Location: [place]  CATH LAB;  Service: Cardiovascular;  Laterality: N/A;   TEE WITHOUT CARDIOVERSION N/A 04/03/2015   Procedure: TRANSESOPHAGEAL ECHOCARDIOGRAM (TEE);  Surgeon: Lelon Perla, MD;  Location: [facility] ENDOSCOPY;  Service: Cardiovascular;  Laterality: N/A;    MEDICATIONS:  amLODipine (NORVASC) 5 MG tablet   aspirin (ASPIRIN CHILDRENS) 81 MG chewable tablet   carvedilol (COREG) 3.125 MG tablet   clopidogrel (PLAVIX) 75 MG tablet   Cyanocobalamin (B-12) 2500 MCG TABS   gabapentin (NEURONTIN) 100 MG capsule   Multiple Vitamins-Minerals (PRESERVISION AREDS 2 PO)   rosuvastatin (CRESTOR) 40 MG tablet   vitamin C (ASCORBIC ACID) 500 MG tablet   No current facility-administered medications for this encounter.    Konrad Felix Ward, PA-C [facility] Pre-Surgical Testing [phone]

## 2021-10-29 DIAGNOSIS — M1711 Unilateral primary osteoarthritis, right knee: Secondary | ICD-10-CM | POA: Diagnosis not present

## 2021-10-30 ENCOUNTER — Other Ambulatory Visit: Payer: Self-pay

## 2021-10-30 ENCOUNTER — Encounter (HOSPITAL_COMMUNITY)
Admission: RE | Admit: 2021-10-30 | Discharge: 2021-10-30 | Disposition: A | Discharge status: Home / Self Care | Payer: Medicare Other | Source: Ambulatory Visit | Attending: Orthopedic Surgery | Admitting: Orthopedic Surgery

## 2021-10-30 DIAGNOSIS — Z01812 Encounter for preprocedural laboratory examination: Secondary | ICD-10-CM | POA: Diagnosis not present

## 2021-10-30 DIAGNOSIS — Z20822 Contact with and (suspected) exposure to covid-19: Secondary | ICD-10-CM | POA: Insufficient documentation

## 2021-10-30 LAB — SARS CORONAVIRUS 2 (TAT 6-24 HRS): SARS Coronavirus 2: NEGATIVE

## 2021-10-31 NOTE — Progress Notes (Signed)
Called patient and spoke with his wife, Aaron Whitaker, about time change for surgery on 11/03/21. To arrive 0800 for 1045 surgery. Complete his ERAS drink by 0745. Remember to take amlodipine, carvediol, gabapentin, and rosuvastatin the morning of surgery. His wife verbalizes understanding. ?

## 2021-11-02 NOTE — Anesthesia Preprocedure Evaluation (Addendum)
Anesthesia Evaluation  ?Patient identified by MRN, date of birth, ID band ?Patient awake ? ? ? ?Reviewed: ?Allergy & Precautions, NPO status , Patient's Chart, lab work & pertinent test results ? ?Airway ?Mallampati: II ? ?TM Distance: >3 FB ?Neck ROM: Full ? ? ? Dental ?no notable dental hx. ?(+) Dental Advisory Given, Upper Dentures, Lower Dentures ?  ?Pulmonary ?former smoker,  ?  ?Pulmonary exam normal ?breath sounds clear to auscultation ? ? ? ? ? ? Cardiovascular ?hypertension, + CAD, + Past MI and + CABG  ?Normal cardiovascular exam ?Rhythm:Regular Rate:Normal ? ?2017 LVEF 50-55% ?  ?Neuro/Psych ?CVA, Residual Symptoms   ? GI/Hepatic ?PUD,   ?Endo/Other  ?Hyperthyroidism  ? Renal/GU ?Renal InsufficiencyRenal diseaseLab Results ?     Component                Value               Date                 ?     CREATININE               2.22 (H)            10/21/2021         ?     K                        3.7                 10/21/2021           ?          ?  ? ?  ?Musculoskeletal ? ?(+) Arthritis ,  ? Abdominal ?  ?Peds ? Hematology ? ?(+) Blood dyscrasia, anemia , Lab Results ?     Component                Value               Date                 ?     HGB                      10.5 (L)            10/21/2021           ?     HCT                      32.9 (L)            10/21/2021            ?     PLT                      227                 10/21/2021           ?   ?Anesthesia Other Findings ? ? Reproductive/Obstetrics ? ?  ? ? ? ? ? ? ? ? ? ? ? ? ? ?  ?  ? ? ? ? ? ? ? ?Anesthesia Physical ?Anesthesia Plan ? ?ASA: 3 ? ?Anesthesia Plan: General  ? ?Post-op Pain Management: Minimal or no pain anticipated and Regional block*  ? ?Induction: Intravenous ? ?PONV Risk Score and Plan: 2 and Treatment may vary due  to age or medical condition and Ondansetron ? ?Airway Management Planned: LMA ? ?Additional Equipment: None ? ?Intra-op Plan:  ? ?Post-operative Plan:  ? ?Informed Consent: I have  reviewed the patients History and Physical, chart, labs and discussed the procedure including the risks, benefits and alternatives for the proposed anesthesia with the patient or authorized representative who has indicated his/her understanding and acceptance.  ? ? ? ?Dental advisory given ? ?Plan Discussed with:  ? ?Anesthesia Plan Comments: (GA w R Adductor  Pt last took plavix 10/31/21 no Neuroaxial anesthesia)  ? ? ? ? ?Anesthesia Quick Evaluation ? ?

## 2021-11-03 ENCOUNTER — Observation Stay (HOSPITAL_COMMUNITY)
Admission: RE | Admit: 2021-11-03 | Discharge: 2021-11-04 | Disposition: A | Discharge status: Home / Self Care | Payer: Medicare Other | Source: Ambulatory Visit | Attending: Orthopedic Surgery | Admitting: Orthopedic Surgery

## 2021-11-03 ENCOUNTER — Ambulatory Visit (HOSPITAL_BASED_OUTPATIENT_CLINIC_OR_DEPARTMENT_OTHER): Payer: Medicare Other | Admitting: Anesthesiology

## 2021-11-03 ENCOUNTER — Other Ambulatory Visit: Payer: Self-pay

## 2021-11-03 ENCOUNTER — Encounter (HOSPITAL_COMMUNITY): Payer: Self-pay | Admitting: Orthopedic Surgery

## 2021-11-03 ENCOUNTER — Ambulatory Visit (HOSPITAL_COMMUNITY): Payer: Medicare Other | Admitting: Physician Assistant

## 2021-11-03 ENCOUNTER — Encounter (HOSPITAL_COMMUNITY)
Admission: RE | Disposition: A | Discharge status: Home / Self Care | Payer: Self-pay | Source: Ambulatory Visit | Attending: Orthopedic Surgery

## 2021-11-03 DIAGNOSIS — Z01812 Encounter for preprocedural laboratory examination: Secondary | ICD-10-CM

## 2021-11-03 DIAGNOSIS — I10 Essential (primary) hypertension: Secondary | ICD-10-CM | POA: Diagnosis not present

## 2021-11-03 DIAGNOSIS — M25561 Pain in right knee: Secondary | ICD-10-CM | POA: Insufficient documentation

## 2021-11-03 DIAGNOSIS — Z85828 Personal history of other malignant neoplasm of skin: Secondary | ICD-10-CM | POA: Insufficient documentation

## 2021-11-03 DIAGNOSIS — M1711 Unilateral primary osteoarthritis, right knee: Principal | ICD-10-CM | POA: Insufficient documentation

## 2021-11-03 DIAGNOSIS — I2510 Atherosclerotic heart disease of native coronary artery without angina pectoris: Secondary | ICD-10-CM | POA: Diagnosis not present

## 2021-11-03 DIAGNOSIS — Z20822 Contact with and (suspected) exposure to covid-19: Secondary | ICD-10-CM | POA: Insufficient documentation

## 2021-11-03 DIAGNOSIS — G8918 Other acute postprocedural pain: Secondary | ICD-10-CM | POA: Diagnosis not present

## 2021-11-03 DIAGNOSIS — I252 Old myocardial infarction: Secondary | ICD-10-CM | POA: Diagnosis not present

## 2021-11-03 DIAGNOSIS — Z96659 Presence of unspecified artificial knee joint: Secondary | ICD-10-CM

## 2021-11-03 DIAGNOSIS — Z87891 Personal history of nicotine dependence: Secondary | ICD-10-CM | POA: Insufficient documentation

## 2021-11-03 DIAGNOSIS — Z8546 Personal history of malignant neoplasm of prostate: Secondary | ICD-10-CM | POA: Diagnosis not present

## 2021-11-03 DIAGNOSIS — Z951 Presence of aortocoronary bypass graft: Secondary | ICD-10-CM | POA: Diagnosis not present

## 2021-11-03 HISTORY — PX: TOTAL KNEE ARTHROPLASTY: SHX125

## 2021-11-03 SURGERY — ARTHROPLASTY, KNEE, TOTAL
Anesthesia: General | Site: Knee | Laterality: Right

## 2021-11-03 MED ORDER — OXYCODONE HCL 5 MG PO TABS
5.0000 mg | ORAL_TABLET | ORAL | Status: DC | PRN
  Administered 2021-11-04: 5 mg via ORAL
  Filled 2021-11-03: qty 1

## 2021-11-03 MED ORDER — HYDROMORPHONE HCL 1 MG/ML IJ SOLN: 0.5000 mg | INTRAMUSCULAR | Status: DC | PRN

## 2021-11-03 MED ORDER — FENTANYL CITRATE PF 50 MCG/ML IJ SOSY: 50.0000 ug | PREFILLED_SYRINGE | INTRAMUSCULAR | Status: DC

## 2021-11-03 MED ORDER — FENTANYL CITRATE PF 50 MCG/ML IJ SOSY: 25.0000 ug | PREFILLED_SYRINGE | INTRAMUSCULAR | Status: DC | PRN

## 2021-11-03 MED ORDER — DEXAMETHASONE SODIUM PHOSPHATE 10 MG/ML IJ SOLN: 8.0000 mg | Freq: Once | INTRAMUSCULAR | Status: AC

## 2021-11-03 MED ORDER — TRANEXAMIC ACID-NACL 1000-0.7 MG/100ML-% IV SOLN
1000.0000 mg | INTRAVENOUS | Status: AC
  Administered 2021-11-03: 1000 mg via INTRAVENOUS
  Filled 2021-11-03: qty 100

## 2021-11-03 MED ORDER — BUPIVACAINE LIPOSOME 1.3 % IJ SUSP: 20.0000 mL | Freq: Once | INTRAMUSCULAR | Status: AC

## 2021-11-03 MED ORDER — ACETAMINOPHEN 500 MG PO TABS
1000.0000 mg | ORAL_TABLET | Freq: Once | ORAL | Status: AC
  Administered 2021-11-03: 1000 mg via ORAL
  Filled 2021-11-03: qty 2

## 2021-11-03 MED ORDER — ONDANSETRON HCL 4 MG/2ML IJ SOLN
INTRAMUSCULAR | Status: DC | PRN
  Administered 2021-11-03: 4 mg via INTRAVENOUS

## 2021-11-03 MED ORDER — CARVEDILOL 3.125 MG PO TABS
3.1250 mg | ORAL_TABLET | Freq: Two times a day (BID) | ORAL | Status: DC
  Administered 2021-11-03 – 2021-11-04 (×2): 3.125 mg via ORAL
  Filled 2021-11-03 (×2): qty 1

## 2021-11-03 MED ORDER — MIDAZOLAM HCL 2 MG/2ML IJ SOLN
INTRAMUSCULAR | Status: AC
  Filled 2021-11-03: qty 2

## 2021-11-03 MED ORDER — DIPHENHYDRAMINE HCL 12.5 MG/5ML PO ELIX: 12.5000 mg | ORAL_SOLUTION | ORAL | Status: DC | PRN

## 2021-11-03 MED ORDER — LIDOCAINE HCL (CARDIAC) PF 100 MG/5ML IV SOSY
PREFILLED_SYRINGE | INTRAVENOUS | Status: DC | PRN
  Administered 2021-11-03: 50 mg via INTRAVENOUS

## 2021-11-03 MED ORDER — PHENOL 1.4 % MT LIQD: 1.0000 | OROMUCOSAL | Status: DC | PRN

## 2021-11-03 MED ORDER — DOCUSATE SODIUM 100 MG PO CAPS
100.0000 mg | ORAL_CAPSULE | Freq: Two times a day (BID) | ORAL | Status: DC
  Administered 2021-11-03 – 2021-11-04 (×2): 100 mg via ORAL
  Filled 2021-11-03 (×2): qty 1

## 2021-11-03 MED ORDER — ONDANSETRON HCL 4 MG/2ML IJ SOLN: 4.0000 mg | Freq: Once | INTRAMUSCULAR | Status: DC | PRN

## 2021-11-03 MED ORDER — METOCLOPRAMIDE HCL 5 MG/ML IJ SOLN: 5.0000 mg | Freq: Three times a day (TID) | INTRAMUSCULAR | Status: DC | PRN

## 2021-11-03 MED ORDER — METHOCARBAMOL 500 MG IVPB - SIMPLE MED
500.0000 mg | Freq: Four times a day (QID) | INTRAVENOUS | Status: DC | PRN
  Filled 2021-11-03: qty 50

## 2021-11-03 MED ORDER — PHENYLEPHRINE 40 MCG/ML (10ML) SYRINGE FOR IV PUSH (FOR BLOOD PRESSURE SUPPORT)
PREFILLED_SYRINGE | INTRAVENOUS | Status: AC
  Filled 2021-11-03: qty 10

## 2021-11-03 MED ORDER — ALUM & MAG HYDROXIDE-SIMETH 200-200-20 MG/5ML PO SUSP: 30.0000 mL | ORAL | Status: DC | PRN

## 2021-11-03 MED ORDER — ROSUVASTATIN CALCIUM 20 MG PO TABS
40.0000 mg | ORAL_TABLET | Freq: Every day | ORAL | Status: DC
  Administered 2021-11-04: 40 mg via ORAL
  Filled 2021-11-03: qty 2

## 2021-11-03 MED ORDER — LACTATED RINGERS IV SOLN: INTRAVENOUS | Status: DC

## 2021-11-03 MED ORDER — WATER FOR IRRIGATION, STERILE IR SOLN
Status: DC | PRN
  Administered 2021-11-03: 2000 mL

## 2021-11-03 MED ORDER — BUPIVACAINE LIPOSOME 1.3 % IJ SUSP
INTRAMUSCULAR | Status: AC
  Filled 2021-11-03: qty 20

## 2021-11-03 MED ORDER — ZOLPIDEM TARTRATE 5 MG PO TABS: 5.0000 mg | ORAL_TABLET | Freq: Every evening | ORAL | Status: DC | PRN

## 2021-11-03 MED ORDER — ACETAMINOPHEN 10 MG/ML IV SOLN: 1000.0000 mg | Freq: Once | INTRAVENOUS | Status: DC | PRN

## 2021-11-03 MED ORDER — SODIUM CHLORIDE 0.9% FLUSH
INTRAVENOUS | Status: DC | PRN
  Administered 2021-11-03: 20 mL via INTRAVENOUS

## 2021-11-03 MED ORDER — ONDANSETRON HCL 4 MG/2ML IJ SOLN
4.0000 mg | Freq: Four times a day (QID) | INTRAMUSCULAR | Status: DC | PRN
Start: 2021-11-03 — End: 2021-11-04

## 2021-11-03 MED ORDER — PROPOFOL 10 MG/ML IV BOLUS
INTRAVENOUS | Status: DC | PRN
  Administered 2021-11-03: 100 mg via INTRAVENOUS

## 2021-11-03 MED ORDER — DEXAMETHASONE SODIUM PHOSPHATE 4 MG/ML IJ SOLN
INTRAMUSCULAR | Status: DC | PRN
  Administered 2021-11-03: 4 mg via INTRAVENOUS

## 2021-11-03 MED ORDER — ONDANSETRON HCL 4 MG PO TABS: 4.0000 mg | ORAL_TABLET | Freq: Four times a day (QID) | ORAL | Status: DC | PRN

## 2021-11-03 MED ORDER — FENTANYL CITRATE PF 50 MCG/ML IJ SOSY
PREFILLED_SYRINGE | INTRAMUSCULAR | Status: AC
  Administered 2021-11-03: 25 ug via INTRAVENOUS
  Filled 2021-11-03: qty 2

## 2021-11-03 MED ORDER — FERROUS SULFATE 325 (65 FE) MG PO TABS
325.0000 mg | ORAL_TABLET | Freq: Three times a day (TID) | ORAL | Status: DC
  Administered 2021-11-03 – 2021-11-04 (×3): 325 mg via ORAL
  Filled 2021-11-03 (×3): qty 1

## 2021-11-03 MED ORDER — BUPIVACAINE LIPOSOME 1.3 % IJ SUSP
INTRAMUSCULAR | Status: DC | PRN
  Administered 2021-11-03: 20 mL

## 2021-11-03 MED ORDER — GABAPENTIN 300 MG PO CAPS
300.0000 mg | ORAL_CAPSULE | Freq: Two times a day (BID) | ORAL | Status: DC
  Administered 2021-11-03 – 2021-11-04 (×2): 300 mg via ORAL
  Filled 2021-11-03 (×2): qty 1

## 2021-11-03 MED ORDER — EPHEDRINE SULFATE (PRESSORS) 50 MG/ML IJ SOLN
INTRAMUSCULAR | Status: DC | PRN
  Administered 2021-11-03 (×2): 10 mg via INTRAVENOUS
  Administered 2021-11-03: 5 mg via INTRAVENOUS

## 2021-11-03 MED ORDER — CLOPIDOGREL BISULFATE 75 MG PO TABS
75.0000 mg | ORAL_TABLET | Freq: Every day | ORAL | Status: DC
  Administered 2021-11-04: 75 mg via ORAL
  Filled 2021-11-03: qty 1

## 2021-11-03 MED ORDER — POVIDONE-IODINE 10 % EX SWAB
2.0000 "application " | Freq: Once | CUTANEOUS | Status: AC
  Administered 2021-11-03: 2 via TOPICAL

## 2021-11-03 MED ORDER — FENTANYL CITRATE (PF) 100 MCG/2ML IJ SOLN
INTRAMUSCULAR | Status: DC | PRN
  Administered 2021-11-03 (×2): 25 ug via INTRAVENOUS
  Administered 2021-11-03: 50 ug via INTRAVENOUS

## 2021-11-03 MED ORDER — GLYCOPYRROLATE 0.2 MG/ML IJ SOLN
INTRAMUSCULAR | Status: DC | PRN
  Administered 2021-11-03: .2 mg via INTRAVENOUS

## 2021-11-03 MED ORDER — SODIUM CHLORIDE (PF) 0.9 % IJ SOLN
INTRAMUSCULAR | Status: AC
  Filled 2021-11-03: qty 20

## 2021-11-03 MED ORDER — SODIUM CHLORIDE 0.9 % IR SOLN
Status: DC | PRN
  Administered 2021-11-03: 1000 mL

## 2021-11-03 MED ORDER — MENTHOL 3 MG MT LOZG: 1.0000 | LOZENGE | OROMUCOSAL | Status: DC | PRN

## 2021-11-03 MED ORDER — METOCLOPRAMIDE HCL 5 MG PO TABS: 5.0000 mg | ORAL_TABLET | Freq: Three times a day (TID) | ORAL | Status: DC | PRN

## 2021-11-03 MED ORDER — DEXAMETHASONE SODIUM PHOSPHATE 10 MG/ML IJ SOLN
10.0000 mg | Freq: Once | INTRAMUSCULAR | Status: AC
  Administered 2021-11-04: 10 mg via INTRAVENOUS
  Filled 2021-11-03: qty 1

## 2021-11-03 MED ORDER — CHLORHEXIDINE GLUCONATE 0.12 % MT SOLN
15.0000 mL | Freq: Once | OROMUCOSAL | Status: AC
  Administered 2021-11-03: 15 mL via OROMUCOSAL

## 2021-11-03 MED ORDER — BUPIVACAINE-EPINEPHRINE (PF) 0.25% -1:200000 IJ SOLN
INTRAMUSCULAR | Status: AC
  Filled 2021-11-03: qty 30

## 2021-11-03 MED ORDER — ACETAMINOPHEN 500 MG PO TABS
1000.0000 mg | ORAL_TABLET | Freq: Four times a day (QID) | ORAL | Status: AC
  Administered 2021-11-03 – 2021-11-04 (×4): 1000 mg via ORAL
  Filled 2021-11-03 (×4): qty 2

## 2021-11-03 MED ORDER — CLONIDINE HCL (ANALGESIA) 100 MCG/ML EP SOLN
EPIDURAL | Status: DC | PRN
  Administered 2021-11-03: 100 ug

## 2021-11-03 MED ORDER — FENTANYL CITRATE (PF) 100 MCG/2ML IJ SOLN
INTRAMUSCULAR | Status: AC
  Filled 2021-11-03: qty 2

## 2021-11-03 MED ORDER — PHENYLEPHRINE HCL (PRESSORS) 10 MG/ML IV SOLN
INTRAVENOUS | Status: DC | PRN
  Administered 2021-11-03: 80 ug via INTRAVENOUS

## 2021-11-03 MED ORDER — GABAPENTIN 300 MG PO CAPS
300.0000 mg | ORAL_CAPSULE | Freq: Once | ORAL | Status: DC
  Filled 2021-11-03: qty 1

## 2021-11-03 MED ORDER — ASPIRIN 81 MG PO CHEW
81.0000 mg | CHEWABLE_TABLET | Freq: Every day | ORAL | Status: DC
  Administered 2021-11-04: 81 mg via ORAL
  Filled 2021-11-03: qty 1

## 2021-11-03 MED ORDER — CEFAZOLIN SODIUM-DEXTROSE 2-4 GM/100ML-% IV SOLN
2.0000 g | INTRAVENOUS | Status: AC
  Administered 2021-11-03: 2 g via INTRAVENOUS
  Filled 2021-11-03: qty 100

## 2021-11-03 MED ORDER — SENNOSIDES-DOCUSATE SODIUM 8.6-50 MG PO TABS: 1.0000 | ORAL_TABLET | Freq: Every evening | ORAL | Status: DC | PRN

## 2021-11-03 MED ORDER — SODIUM CHLORIDE 0.9 % IV SOLN: INTRAVENOUS | Status: DC

## 2021-11-03 MED ORDER — ORAL CARE MOUTH RINSE: 15.0000 mL | Freq: Once | OROMUCOSAL | Status: AC

## 2021-11-03 MED ORDER — AMLODIPINE BESYLATE 5 MG PO TABS
5.0000 mg | ORAL_TABLET | Freq: Every day | ORAL | Status: DC
  Administered 2021-11-04: 5 mg via ORAL
  Filled 2021-11-03: qty 1

## 2021-11-03 MED ORDER — FLEET ENEMA 7-19 GM/118ML RE ENEM: 1.0000 | ENEMA | Freq: Once | RECTAL | Status: DC | PRN

## 2021-11-03 MED ORDER — PANTOPRAZOLE SODIUM 40 MG PO TBEC
40.0000 mg | DELAYED_RELEASE_TABLET | Freq: Every day | ORAL | Status: DC
  Administered 2021-11-03 – 2021-11-04 (×2): 40 mg via ORAL
  Filled 2021-11-03 (×2): qty 1

## 2021-11-03 MED ORDER — METHOCARBAMOL 500 MG PO TABS
500.0000 mg | ORAL_TABLET | Freq: Four times a day (QID) | ORAL | Status: DC | PRN
  Administered 2021-11-04: 500 mg via ORAL
  Filled 2021-11-03: qty 1

## 2021-11-03 MED ORDER — BUPIVACAINE-EPINEPHRINE 0.25% -1:200000 IJ SOLN
INTRAMUSCULAR | Status: DC | PRN
  Administered 2021-11-03: 30 mL

## 2021-11-03 MED ORDER — BISACODYL 5 MG PO TBEC: 5.0000 mg | DELAYED_RELEASE_TABLET | Freq: Every day | ORAL | Status: DC | PRN

## 2021-11-03 MED ORDER — MIDAZOLAM HCL 2 MG/2ML IJ SOLN: 1.0000 mg | INTRAMUSCULAR | Status: DC

## 2021-11-03 SURGICAL SUPPLY — 59 items
BAG COUNTER SPONGE SURGICOUNT (BAG) IMPLANT
BAG SPEC THK2 15X12 ZIP CLS (MISCELLANEOUS) ×1
BAG SPNG CNTER NS LX DISP (BAG)
BAG ZIPLOCK 12X15 (MISCELLANEOUS) ×2 IMPLANT
BLADE SAGITTAL 13X1.27X60 (BLADE) ×2 IMPLANT
BLADE SAW SGTL 18X1.27X75 (BLADE) ×2 IMPLANT
BLADE SURG 15 STRL LF DISP TIS (BLADE) ×1 IMPLANT
BLADE SURG 15 STRL SS (BLADE) ×2
BLADE SURG SZ10 CARB STEEL (BLADE) ×4 IMPLANT
BNDG ELASTIC 6X5.8 VLCR STR LF (GAUZE/BANDAGES/DRESSINGS) ×2 IMPLANT
BOWL SMART MIX CTS (DISPOSABLE) ×2 IMPLANT
BSPLAT TIB 5D G CMNT STM RT (Knees) ×1 IMPLANT
CEMENT BONE SIMPLEX SPEEDSET (Cement) ×4 IMPLANT
CLSR STERI-STRIP ANTIMIC 1/2X4 (GAUZE/BANDAGES/DRESSINGS) ×1 IMPLANT
COVER SURGICAL LIGHT HANDLE (MISCELLANEOUS) ×2 IMPLANT
CUFF TOURN SGL QUICK 34 (TOURNIQUET CUFF) ×2
CUFF TRNQT CYL 34X4.125X (TOURNIQUET CUFF) ×1 IMPLANT
DRAPE INCISE IOBAN 66X45 STRL (DRAPES) ×4 IMPLANT
DRAPE U-SHAPE 47X51 STRL (DRAPES) ×2 IMPLANT
DRSG AQUACEL AG ADV 3.5X10 (GAUZE/BANDAGES/DRESSINGS) ×2 IMPLANT
DRSG TELFA 4X10 ISLAND STR (GAUZE/BANDAGES/DRESSINGS) ×1 IMPLANT
DURAPREP 26ML APPLICATOR (WOUND CARE) ×4 IMPLANT
ELECT REM PT RETURN 15FT ADLT (MISCELLANEOUS) ×2 IMPLANT
FEMUR  CMT CCR STD SZ9 R KNEE (Knees) ×2 IMPLANT
FEMUR CMT CCR STD SZ9 R KNEE (Knees) ×1 IMPLANT
FEMUR CMTD CCR STD SZ9 R KNEE (Knees) IMPLANT
GLOVE SURG ENC TEXT LTX SZ7.5 (GLOVE) IMPLANT
GLOVE SURG ENC TEXT LTX SZ8 (GLOVE) ×6 IMPLANT
GLOVE SURG UNDER POLY LF SZ7.5 (GLOVE) IMPLANT
GLOVE SURG UNDER POLY LF SZ8.5 (GLOVE) ×4 IMPLANT
GOWN STRL REUS W/ TWL XL LVL3 (GOWN DISPOSABLE) ×2 IMPLANT
GOWN STRL REUS W/TWL XL LVL3 (GOWN DISPOSABLE) ×4
HANDPIECE INTERPULSE COAX TIP (DISPOSABLE) ×2
HOLDER FOLEY CATH W/STRAP (MISCELLANEOUS) ×2 IMPLANT
HOOD PEEL AWAY FLYTE STAYCOOL (MISCELLANEOUS) ×6 IMPLANT
INSERT TIBIAL PLY R GH6-9X10 (Insert) ×1 IMPLANT
KIT TURNOVER KIT A (KITS) IMPLANT
MANIFOLD NEPTUNE II (INSTRUMENTS) ×2 IMPLANT
NDL HYPO 21X1.5 SAFETY (NEEDLE) ×1 IMPLANT
NEEDLE HYPO 21X1.5 SAFETY (NEEDLE) ×2 IMPLANT
NS IRRIG 1000ML POUR BTL (IV SOLUTION) ×2 IMPLANT
PACK TOTAL KNEE CUSTOM (KITS) ×2 IMPLANT
PROTECTOR NERVE ULNAR (MISCELLANEOUS) ×2 IMPLANT
SET HNDPC FAN SPRY TIP SCT (DISPOSABLE) ×1 IMPLANT
SPIKE FLUID TRANSFER (MISCELLANEOUS) ×4 IMPLANT
STEM POLY PAT PLY 38M KNEE (Knees) ×1 IMPLANT
STEM TIBIA 5 DEG SZ G R KNEE (Knees) IMPLANT
STRIP CLOSURE SKIN 1/2X4 (GAUZE/BANDAGES/DRESSINGS) ×2 IMPLANT
SUT BONE WAX W31G (SUTURE) ×2 IMPLANT
SUT MNCRL AB 3-0 PS2 18 (SUTURE) ×2 IMPLANT
SUT STRATAFIX 0 PDS 27 VIOLET (SUTURE) ×2 IMPLANT
SUT STRATAFIX PDS+ 0 24IN (SUTURE) ×2 IMPLANT
SUT VIC AB 1 CT1 36 (SUTURE) ×2 IMPLANT
SUTURE STRATFX 0 PDS 27 VIOLET (SUTURE) ×1 IMPLANT
SYR 30ML LL (SYRINGE) ×4 IMPLANT
TIBIA STEM 5 DEG SZ G R KNEE (Knees) ×2 IMPLANT
TRAY FOLEY MTR SLVR 16FR STAT (SET/KITS/TRAYS/PACK) ×2 IMPLANT
WATER STERILE IRR 1000ML POUR (IV SOLUTION) ×4 IMPLANT
WRAP KNEE MAXI GEL POST OP (GAUZE/BANDAGES/DRESSINGS) ×2 IMPLANT

## 2021-11-03 NOTE — Progress Notes (Signed)
Orthopedic Tech Progress Note ?Patient Details:  ?Aaron Whitaker ?Jan 26, 1941 ?[id number] ? ?Ortho Devices ?Type of Ortho Device: Bone foam zero knee ?Ortho Device/Splint Interventions: Application ?  ?Post Interventions ?Patient Tolerated: Well ?Instructions Provided: Care of device ? ?Aaron Whitaker ?11/03/2021, 1:29 PM ? ?

## 2021-11-03 NOTE — Op Note (Signed)
TOTAL KNEE REPLACEMENT OPERATIVE NOTE: ? ?11/03/2021 ? ?3:33 PM ? ?PATIENT:  Aaron Whitaker  81 y.o. male ? ?PRE-OPERATIVE DIAGNOSIS:  Osteoarthritis of right knee M17.11 ? ?POST-OPERATIVE DIAGNOSIS:  Osteoarthritis of right knee ? ?PROCEDURE:  Procedure(s): ?TOTAL KNEE ARTHROPLASTY ? ?SURGEON:  Surgeon(s): ?Vickey Huger, MD ?Willaim Sheng, MD ? ?PHYSICIAN ASSISTANT: Carlyon Shadow, PA-C  ? ?ANESTHESIA:   spinal ? ?SPECIMEN: None ? ?COUNTS:  Correct ? ?TOURNIQUET:   ?Total Tourniquet Time Documented: ?Thigh (Right) - 44 minutes ?Total: Thigh (Right) - 44 minutes ? ? ?DICTATION: ? ?Indication for procedure:   ? ?The patient is a 81 y.o. male who has failed conservative treatment for Osteoarthritis of right knee M17.11.  Informed consent was obtained prior to anesthesia. The risks versus benefits of the operation were explain and in a way the patient can, and did, understand.  ? ? ? ?Description of procedure:   ?  ?The patient was taken to the operating room and placed under anesthesia.  The patient was positioned in the usual fashion taking care that all body parts were adequately padded and/or protected.  A tourniquet was applied and the leg prepped and draped in the usual sterile fashion.  The extremity was exsanguinated with the esmarch and tourniquet inflated to 250 mmHg.  Pre-operative range of motion was normal.   ? ?A midline incision approximately 6-7 inches long was made with a #10 blade.  A new blade was used to make a parapatellar arthrotomy going 2-3 cm into the quadriceps tendon, over the patella, and alongside the medial aspect of the patellar tendon.  A synovectomy was then performed with the #10 blade and forceps. I then elevated the deep MCL off the medial tibial metaphysis subperiosteally around to the semimembranosus attachment.   ? ?I everted the patella and used calipers to measure patellar thickness.  I used the reamer to ream down to appropriate thickness to recreate the native  thickness.  I then removed excess bone with the rongeur and sagittal saw.  I used the appropriately sized template and drilled the three lug holes.  I then put the trial in place and measured the thickness with the calipers to ensure recreation of the native thickness.  The trial was then removed and the patella subluxed and the knee brought into flexion. ? ?A homan retractor was place to retract and protect the patella and lateral structures.  A Z-retractor was place medially to protect the medial structures.  The extra-medullary alignment system was used to make cut the tibial articular surface perpendicular to the anamotic axis of the tibia and in 3 degrees of posterior slope.  The cut surface and alignment jig was removed.  I then used the intramedullary alignment guide to make a 5 valgus cut on the distal femur. ? ?I then marked out the epicondylar axis on the distal femur.   I then used the anterior referencing sizer and measured the femur to be a size 9.  The 4-In-1 cutting block was screwed into place in external rotation matching the posterior condylar angle, making our cuts perpendicular to the epicondylar axis.  Anterior, posterior and chamfer cuts were made with the sagittal saw.  The cutting block and cut pieces were removed. ? ?A lamina spreader was placed in 90 degrees of flexion.  The ACL, PCL, menisci, and posterior condylar osteophytes were removed.  A 10 mm spacer blocked was found to offer good flexion and extension gap balance after minimal in degree releasing.  The scoop retractor was then placed and the femoral finishing block was pinned in place.  The small sagittal saw was used as well as the lug drill to finish the femur.  The block and cut surfaces were removed and the medullary canal hole filled with autograft bone from the cut pieces. ? ?The tibia was delivered forward in deep flexion and external rotation.  A size G tray was selected and pinned into place centered on the medial 1/3 of  the tibial tubercle.  The reamer and keel was used to prepare the tibia through the tray.   ? ?I then trialed with the size 9 femur, size G tibia, a 10 mm insert and the 38 patella.  I had excellent flexion/extension gap balance, excellent patella tracking.  Flexion was full and beyond 120 degrees; extension was zero.  These components were chosen and the staff opened them to me on the back table while the knee was lavaged copiously and the cement mixed. ? ?The soft tissue was infiltrated with 60cc of exparel 1.3% through a 21 gauge needle. ? ?I cemented in the components and removed all excess cement.  The polyethylene tibial component was snapped into place and the knee placed in extension while cement was hardening.  The capsule was infilltrated with a 60cc exparel/marcaine/saline mixture.   Once the cement was hard, the tourniquet was let down.  Hemostasis was obtained.  The arthrotomy was closed using a #1 stratofix running suture.  The deep soft tissues were closed with #0 vicryls and the subcuticular layer closed with #2-0 vicryl.  The skin was reapproximated and closed with 3.0 Monocryl.  The wound was covered with steristrips, aquacel dressing, and a TED stocking.   The patient was then awakened, extubated, and taken to the recovery room in stable condition. ? ?BLOOD LOSS:  300cc ?COMPLICATIONS:  None. ? ?PLAN OF CARE: Admit for overnight observation ? ?PATIENT DISPOSITION:  PACU - hemodynamically stable. ?  ?Delay start of Pharmacological VTE agent (>24hrs) due to surgical blood loss or risk of bleeding:  yes ? ?Please fax a copy of this op note to my office at [phone] (please only include page 1 and 2 of the Case Information op note) ? ? ? ? ? ? ? ? ? ? ? ?  ?

## 2021-11-03 NOTE — Anesthesia Procedure Notes (Addendum)
Anesthesia Regional Block: Adductor canal block  ? ?Pre-Anesthetic Checklist: , timeout performed,  Correct Patient, Correct Site, Correct Laterality,  Correct Procedure, Correct Position, site marked,  Risks and benefits discussed,  Surgical consent,  Pre-op evaluation,  At surgeon's request and post-op pain management ? ?Laterality: Right and Lower ? ?Prep: chloraprep     ?  ?Needles:  ?Injection technique: Single-shot ? ?Needle Type: Echogenic Needle   ? ? ?Needle Length: 9cm  ?Needle Gauge: 22  ? ? ? ?Additional Needles: ? ? ?Procedures:,,,, ultrasound used (permanent image in chart),,    ?Narrative:  ?Start time: 11/03/2021 10:23 AM ?End time: 11/03/2021 10:28 AM ?Injection made incrementally with aspirations every 5 mL. ? ?Performed by: Personally  ?Anesthesiologist: Barnet Glasgow, MD ? ?Additional Notes: ?Block assessed prior to surgery. Pt tolerated procedure well. ? ? ? ? ?

## 2021-11-03 NOTE — Progress Notes (Signed)
Orthopedic Tech Progress Note ?Patient Details:  ?Aaron Whitaker ?[birth year] ?[id number] ? ?CPM Right Knee ?CPM Right Knee: On ?Right Knee Flexion (Degrees): 90 ?Right Knee Extension (Degrees): 0 ? ?Post Interventions ?Patient Tolerated: Well ?Instructions Provided: Care of device, Adjustment of device ? ?[place] Pink ?11/03/2021, 2:15 PM ? ?

## 2021-11-03 NOTE — Progress Notes (Signed)
AssistedDr. Houser with right, ultrasound guided, adductor canal block. Side rails up, monitors on throughout procedure. See vital signs in flow sheet. Tolerated Procedure well.  

## 2021-11-03 NOTE — Anesthesia Postprocedure Evaluation (Signed)
Anesthesia Post Note ? ?Patient: Aaron Whitaker ? ?Procedure(s) Performed: TOTAL KNEE ARTHROPLASTY (Right: Knee) ? ?  ? ?Patient location during evaluation: PACU ?Anesthesia Type: General and Regional ?Level of consciousness: awake and alert ?Pain management: pain level controlled ?Vital Signs Assessment: post-procedure vital signs reviewed and stable ?Respiratory status: spontaneous breathing, nonlabored ventilation, respiratory function stable and patient connected to nasal cannula oxygen ?Cardiovascular status: blood pressure returned to baseline and stable ?Postop Assessment: no apparent nausea or vomiting ?Anesthetic complications: no ? ? ?No notable events documented. ? ?Last Vitals:  ?Vitals:  ? 11/03/21 1400 11/03/21 1500  ?BP: 131/83 137/82  ?Pulse: 64 68  ?Resp: 12 11  ?Temp:    ?SpO2: 100% 99%  ?  ?Last Pain:  ?Vitals:  ? 11/03/21 1400  ?TempSrc:   ?PainSc: 2   ? ? ?  ?  ?  ?  ?  ?  ? ?Barnet Glasgow ? ? ? ? ?

## 2021-11-03 NOTE — Transfer of Care (Signed)
Immediate Anesthesia Transfer of Care Note ? ?Patient: Aaron Whitaker ? ?Procedure(s) Performed: Procedure(s): ?TOTAL KNEE ARTHROPLASTY (Right) ? ?Patient Location: PACU ? ?Anesthesia Type:General ? ?Level of Consciousness:  sedated, patient cooperative and responds to stimulation ? ?Airway & Oxygen Therapy:Patient Spontanous Breathing and Patient connected to face mask oxgen ? ?Post-op Assessment:  Report given to PACU RN and Post -op Vital signs reviewed and stable ? ?Post vital signs:  Reviewed and stable ? ?Last Vitals:  ?Vitals:  ? 11/03/21 1024 11/03/21 1025  ?BP:  133/80  ?Pulse: (!) 55 (!) 54  ?Resp: 15 19  ?Temp:    ?SpO2: 91% 90%  ? ? ?Complications: No apparent anesthesia complications ? ?

## 2021-11-03 NOTE — H&P (Signed)
Aaron Whitaker ?MRN:  [id number] ?DOB/SEX:  10/27/1940/male ? ?CHIEF COMPLAINT:  Painful right Knee ? ?HISTORY: ?Patient is a 81 y.o. male presented with a history of pain in the right knee. Onset of symptoms was gradual starting a few years ago with gradually worsening course since that time. Patient has been treated conservatively with over-the-counter NSAIDs and activity modification. Patient currently rates pain in the knee at 10 out of 10 with activity. There is pain at night. ? ?PAST MEDICAL HISTORY: ?Patient Active Problem List  ? Diagnosis Date Noted  ? Residual ASD (atrial septal defect) following repair 09/10/2015  ? Coronary artery disease involving coronary bypass graft of native heart with unspecified angina pectoris   ? Cerebral thrombosis with cerebral infarction ([place]) 04/01/2015  ? Acute CVA (cerebrovascular accident) ([place]) 04/01/2015  ? Stroke [facility])   ? Dyslipidemia (high LDL; low HDL)   ? Cerebral infarction due to embolism of left middle cerebral artery ([place])   ? PFO (patent foramen ovale)   ? Essential hypertension   ? History of right MCA stroke   ? Aphasia 03/31/2015  ? CAD (coronary artery disease), native coronary artery 11/17/2011  ? Hemiparesis and alteration of sensations as late effects of stroke ([place]) 11/17/2011  ? Hyperlipidemia 11/17/2011  ? Essential hypertension, benign 11/17/2011  ? ASD (atrial septal defect) 11/17/2011  ? ?Past Medical History:  ?Diagnosis Date  ? Arthritis   ? Blood transfusion   ? Heart disease   ? History of kidney stones   ? Hx of CABG   ? Hyperlipidemia   ? Hypertension   ? Hyperthyroidism   ? Lower GI bleeding 1987  ? "bleeding ulcers; got 6 pints of blood"  ? 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 MI (myocardial infarction) ([place]) 03/2006  ? Pernicious anemia   ? Prostate cancer ([place]) ~2005  ? S/P radiation; seed implants  ? Skin cancer   ? Stroke [facility]) 2007; 12/2010, 03/2015  ? "light"; denies residuals, July 31,2016 most recent  ? Ulcerative colitis    ? ?Past Surgical History:  ?Procedure Laterality Date  ? CARDIAC CATHETERIZATION N/A 09/10/2015  ? Procedure: ASD/VSD Closure;  Surgeon: [name], MD;  Location: [place] CV LAB;  Service: Cardiovascular;  Laterality: N/A;  ? CORONARY ARTERY BYPASS GRAFT  02/2006  ? CABG X 5  ? EP IMPLANTABLE DEVICE N/A 04/03/2015  ? Procedure: Loop Recorder Insertion;  Surgeon: Evans Lance, MD;  Location: [place] CV LAB;  Service: Cardiovascular;  Laterality: N/A;  ? GASTRIC RESECTION  ~ 1987  ? "had ~ 90% of my stomach removed"  ? 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 KNEE ARTHROSCOPY    ? bilaterally; "long long time ago" (11/17/11)  ? PATENT FORAMEN OVALE CLOSURE  03/2015  ? PATENT FORAMEN OVALE CLOSURE N/A 11/17/2011  ? Procedure: PATENT FORAMEN OVALE CLOSURE;  Surgeon: Laverda Page, MD;  Location: [facility] CATH LAB;  Service: Cardiovascular;  Laterality: N/A;  ? TEE WITHOUT CARDIOVERSION N/A 04/03/2015  ? Procedure: TRANSESOPHAGEAL ECHOCARDIOGRAM (TEE);  Surgeon: Lelon Perla, MD;  Location: [facility] ENDOSCOPY;  Service: Cardiovascular;  Laterality: N/A;  ?  ? ?MEDICATIONS:   ?No medications prior to admission.  ? ? ?ALLERGIES:  No Known Allergies ? ?REVIEW OF SYSTEMS:  ?A comprehensive review of systems was negative except for: Musculoskeletal: positive for arthralgias and bone pain ? ? ?FAMILY HISTORY:   ?Family History  ?Problem Relation Age of Onset  ? Cancer Mother   ?  Other Father   ?     house fire  ? Alzheimer's disease Sister   ? Prostate cancer Brother   ? ? ?SOCIAL HISTORY:   ?Social History  ? ?Tobacco Use  ? Smoking status: Former  ?  Packs/day: 0.50  ?  Years: 8.00  ?  Pack years: 4.00  ?  Types: Cigarettes  ?  Quit date: 08/31/1965  ?  Years since quitting: 56.2  ? Smokeless tobacco: Current  ?  Types: Chew  ? Tobacco comments:  ?  2 pouches daily  ?Substance Use Topics  ? Alcohol use: No  ?  Alcohol/week: 0.0 standard drinks  ? ?  ?EXAMINATION: ? ?Vital signs in last 24 hours: ?  ? ?There were no vitals taken  for this visit. ? ?General Appearance:    Alert, cooperative, no distress, appears stated age  ?Head:    Normocephalic, without obvious abnormality, atraumatic  ?Eyes:    PERRL, conjunctiva/corneas clear, EOM's intact, fundi  ?  benign, both eyes       ?Ears:    Normal TM's and external ear canals, both ears  ?Nose:   Nares normal, septum midline, mucosa normal, no drainage    or sinus tenderness  ?Throat:   Lips, mucosa, and tongue normal; teeth and gums normal  ?Neck:   Supple, symmetrical, trachea midline, no adenopathy;     ?  thyroid:  No enlargement/tenderness/nodules; no carotid ?  bruit or JVD  ?Back:     Symmetric, no curvature, ROM normal, no CVA tenderness  ?Lungs:     Clear to auscultation bilaterally, respirations unlabored  ?Chest wall:    No tenderness or deformity  ?Heart:    Regular rate and rhythm, S1 and S2 normal, no murmur, rub   or gallop  ?Abdomen:     Soft, non-tender, bowel sounds active all four quadrants,  ?  no masses, no organomegaly  ?Genitalia:    Normal male without lesion, discharge or tenderness  ?Rectal:    Normal tone, normal prostate, no masses or tenderness; ?  guaiac negative stool  ?Extremities:   Extremities normal, atraumatic, no cyanosis or edema  ?Pulses:   2+ and symmetric all extremities  ?Skin:   Skin color, texture, turgor normal, no rashes or lesions  ?Lymph nodes:   Cervical, supraclavicular, and axillary nodes normal  ?Neurologic:   CNII-XII intact. Normal strength, sensation and reflexes    ?  throughout  ? ? ?Musculoskeletal:  ROM 0-120, Ligaments intact,  ?Imaging Review ?Plain radiographs demonstrate severe degenerative joint disease of the right knee. The overall alignment is neutral. The bone quality appears to be good for age and reported activity level. ? ?Assessment/Plan: ?Primary osteoarthritis, right knee  ? ?The patient history, physical examination and imaging studies are consistent with advanced degenerative joint disease of the right knee. The patient  has failed conservative treatment.  The clearance notes were reviewed.  After discussion with the patient it was felt that Total Knee Replacement was indicated. The procedure,  risks, and benefits of total knee arthroplasty were presented and reviewed. The risks including but not limited to aseptic loosening, infection, blood clots, vascular injury, stiffness, patella tracking problems complications among others were discussed. The patient acknowledged the explanation, agreed to proceed with the plan. ? ?Preoperative templating of the joint replacement has been completed, documented, and submitted to the Operating Room personnel in order to optimize intra-operative equipment management.  ? ? ?Patient's anticipated LOS is less than 2 midnights, meeting  these requirements: ?- Lives within 1 hour of care ?- Has a competent adult at home to recover with post-op recover ?- NO history of ? - Chronic pain requiring opiods ? - Diabetes ? - Coronary Artery Disease ? - Heart failure ? - Heart attack ? - Stroke ? - DVT/VTE ? - Cardiac arrhythmia ? - Respiratory Failure/COPD ? - Renal failure ? - Anemia ? - Advanced Liver disease ? ?  ? ?Donia Ast ?11/03/2021, 7:05 AM  ?

## 2021-11-03 NOTE — Anesthesia Procedure Notes (Signed)
Procedure Name: LMA Insertion ?Date/Time: 11/03/2021 11:56 AM ?Performed by: Lavina Hamman, CRNA ?Pre-anesthesia Checklist: Patient identified, Emergency Drugs available, Suction available and Patient being monitored ?Patient Re-evaluated:Patient Re-evaluated prior to induction ?Oxygen Delivery Method: Circle System Utilized ?Preoxygenation: Pre-oxygenation with 100% oxygen ?Induction Type: IV induction ?Ventilation: Mask ventilation without difficulty ?LMA: LMA with gastric port inserted ?LMA Size: 4.0 ?Number of attempts: 1 ?Airway Equipment and Method: Bite block ?Placement Confirmation: positive ETCO2 ?Tube secured with: Tape ?Dental Injury: Teeth and Oropharynx as per pre-operative assessment  ? ? ? ? ?

## 2021-11-04 ENCOUNTER — Encounter (HOSPITAL_COMMUNITY): Payer: Self-pay | Admitting: Orthopedic Surgery

## 2021-11-04 DIAGNOSIS — Z85828 Personal history of other malignant neoplasm of skin: Secondary | ICD-10-CM | POA: Diagnosis not present

## 2021-11-04 DIAGNOSIS — Z8546 Personal history of malignant neoplasm of prostate: Secondary | ICD-10-CM | POA: Diagnosis not present

## 2021-11-04 DIAGNOSIS — Z20822 Contact with and (suspected) exposure to covid-19: Secondary | ICD-10-CM | POA: Diagnosis not present

## 2021-11-04 DIAGNOSIS — I10 Essential (primary) hypertension: Secondary | ICD-10-CM | POA: Diagnosis not present

## 2021-11-04 DIAGNOSIS — M25561 Pain in right knee: Secondary | ICD-10-CM | POA: Diagnosis not present

## 2021-11-04 DIAGNOSIS — M1711 Unilateral primary osteoarthritis, right knee: Secondary | ICD-10-CM | POA: Diagnosis not present

## 2021-11-04 LAB — CBC
HCT: 27.2 % — ABNORMAL LOW (ref 39.0–52.0)
Hemoglobin: 8.6 g/dL — ABNORMAL LOW (ref 13.0–17.0)
MCH: 30.6 pg (ref 26.0–34.0)
MCHC: 31.6 g/dL (ref 30.0–36.0)
MCV: 96.8 fL (ref 80.0–100.0)
Platelets: 279 10*3/uL (ref 150–400)
RBC: 2.81 MIL/uL — ABNORMAL LOW (ref 4.22–5.81)
RDW: 13.1 % (ref 11.5–15.5)
WBC: 10.7 10*3/uL — ABNORMAL HIGH (ref 4.0–10.5)
nRBC: 0 % (ref 0.0–0.2)

## 2021-11-04 LAB — BASIC METABOLIC PANEL
Anion gap: 7 (ref 5–15)
BUN: 25 mg/dL — ABNORMAL HIGH (ref 8–23)
CO2: 24 mmol/L (ref 22–32)
Calcium: 8.4 mg/dL — ABNORMAL LOW (ref 8.9–10.3)
Chloride: 104 mmol/L (ref 98–111)
Creatinine, Ser: 1.76 mg/dL — ABNORMAL HIGH (ref 0.61–1.24)
GFR, Estimated: 38 mL/min — ABNORMAL LOW (ref >60)
Glucose, Bld: 168 mg/dL — ABNORMAL HIGH (ref 70–99)
Potassium: 4.4 mmol/L (ref 3.5–5.1)
Sodium: 135 mmol/L (ref 135–145)

## 2021-11-04 MED ORDER — METHOCARBAMOL 500 MG PO TABS: 500.0000 mg | ORAL_TABLET | Freq: Four times a day (QID) | ORAL | 0 refills | Status: DC | PRN

## 2021-11-04 MED ORDER — OXYCODONE HCL 5 MG PO TABS: 5.0000 mg | ORAL_TABLET | ORAL | 0 refills | Status: DC | PRN

## 2021-11-04 NOTE — TOC Transition Note (Signed)
Transition of Care (TOC) - CM/SW Discharge Note ? ? ?Patient Details  ?Name: Aaron Whitaker ?MRN: [id number] ?Date of Birth: 1941-06-05 ? ?Transition of Care (TOC) CM/SW Contact:  ?Avagail Whittlesey, LCSW ?Phone Number: ?11/04/2021, 11:42 AM ? ? ?Clinical Narrative:    ?Met with pt and confirming need for 3n1 commode - no agency pref - ordered via [place] for delivery to room.  Plan for OPPT already arranged in [facility].  No further TOC needs. ? ? ?Final next level of care: OP Rehab ?Barriers to Discharge: No Barriers Identified ? ? ?Patient Goals and CMS Choice ?Patient states their goals for this hospitalization and ongoing recovery are:: return home ?  ?  ? ?Discharge Placement ?  ?           ?  ?  ?  ?  ? ?Discharge Plan and Services ?  ?  ?           ?DME Arranged: 3-N-1 ?DME Agency: [employer] ?Date DME Agency Contacted: 11/04/21 ?Time DME Agency Contacted: [phone] ?Representative spoke with at DME Agency: Andee Poles ?  ?  ?  ?  ?  ? ?Social Determinants of Health (SDOH) Interventions ?  ? ? ?Readmission Risk Interventions ?No flowsheet data found. ? ? ? ? ?

## 2021-11-04 NOTE — Plan of Care (Signed)
Plan of care reviewed and discussed with the patient. 

## 2021-11-04 NOTE — Progress Notes (Signed)
SPORTS MEDICINE AND JOINT REPLACEMENT ? ?Lara Mulch, MD    Carlyon Shadow, PA-C ?[place], [place], [place]  [address] ?                            [phone] ? ? PROGRESS NOTE ? ?Subjective: ? negative for Chest Pain ? negative for Shortness of Breath ? negative for Nausea/Vomiting  ? negative for Calf Pain ? negative for Bowel Movement ? ? Tolerating Diet: yes   ?    ?  Patient reports pain as 4 on 0-10 scale.   ? ?Objective: ?Vital signs in last 24 hours:  ? Patient Vitals for the past 24 hrs: ? BP Temp Temp src Pulse Resp SpO2  ?11/04/21 0935 (!) 148/74 98.1 ?F (36.7 ?C) Oral 63 18 93 %  ?11/04/21 0821 135/71 98.5 ?F (36.9 ?C) Oral 67 15 93 %  ?11/04/21 0506 (!) 146/72 98.5 ?F (36.9 ?C) -- 70 16 92 %  ?11/04/21 0119 130/72 98.2 ?F (36.8 ?C) -- 78 16 92 %  ?11/03/21 2025 132/83 98.2 ?F (36.8 ?C) -- 91 16 94 %  ?11/03/21 1730 (!) 146/75 (!) 97.5 ?F (36.4 ?C) Axillary 63 18 95 %  ?11/03/21 1600 137/78 -- -- (!) 58 20 98 %  ?11/03/21 1500 137/82 -- -- 68 11 99 %  ?11/03/21 1400 131/83 -- -- 64 12 100 %  ?11/03/21 1345 131/71 -- -- (!) 58 13 92 %  ?11/03/21 1330 129/73 -- -- 62 14 99 %  ?11/03/21 1317 133/80 97.6 ?F (36.4 ?C) -- 62 16 99 %  ?  ?'@flow'$ {1959:LAST@ ?  ?Intake/Output from previous day:  ? 03/06 0701 - 03/07 0700 ?In: 2954.7 [P.O.:900; I.V.:2054.7] ?Out: 925 [Urine:925] ?  ?Intake/Output this shift:  ? 03/07 0701 - 03/07 1900 ?In: 405.9 [P.O.:360; I.V.:45.9] ?Out: 700 [Urine:700]  ? Intake/Output   ?   03/06 0701 ?03/07 0700 03/07 0701 ?03/08 0700  ? P.O. 900 360  ? I.V. (mL/kg) 2054.7 (28.3) 45.9 (0.6)  ? IV Piggyback 0   ? Total Intake(mL/kg) 2954.7 (40.6) 405.9 (5.6)  ? Urine (mL/kg/hr) 925 700 (1.7)  ? Total Output 925 700  ? Net +2029.7 -294.1  ?     ?   ? ?LABORATORY DATA: ?Recent Labs  ?  11/04/21 ?0306  ?WBC 10.7*  ?HGB 8.6*  ?HCT 27.2*  ?PLT 279  ? ?Recent Labs  ?  11/04/21 ?0306  ?NA 135  ?K 4.4  ?CL 104  ?CO2 24  ?BUN 25*  ?CREATININE 1.76*  ?GLUCOSE 168*  ?CALCIUM 8.4*  ? ?Lab  Results  ?Component Value Date  ? INR 1.10 03/31/2015  ? INR 1.05 01/16/2011  ? ? ?Examination: ? ?General appearance: alert, cooperative, and no distress ?Extremities: extremities normal, atraumatic, no cyanosis or edema ? ?Wound Exam: clean, dry, intact  ? ?Drainage:  None: wound tissue dry ? ?Motor Exam: Quadriceps and Hamstrings Intact ? ?Sensory Exam: Superficial Peroneal, Deep Peroneal, and Tibial normal ? ? ?Assessment:   ? ?1 Day Post-Op  Procedure(s) (LRB): ?TOTAL KNEE ARTHROPLASTY (Right) ? ?ADDITIONAL DIAGNOSIS:  ?Principal Problem: ?  S/P total knee replacement ? ? ? ? ?Plan: ?Physical Therapy as ordered Weight Bearing as Tolerated (WBAT) ? ?DVT Prophylaxis:  Aspirin ? ?DISCHARGE PLAN: Home ? ?Patient doing well and ready for D/C home, orders in ? ?    ? ?Patient's anticipated LOS is less than 2 midnights, meeting these requirements: ?- Lives within  1 hour of care ?- Has a competent adult at home to recover with post-op recover ?- NO history of ? - Chronic pain requiring opiods ? - Diabetes ? - Coronary Artery Disease ? - Heart failure ? - Heart attack ? - Stroke ? - DVT/VTE ? - Cardiac arrhythmia ? - Respiratory Failure/COPD ? - Renal failure ? - Anemia ? - Advanced Liver disease ? ?   ? ?Donia Ast ?11/04/2021, 12:45 PM  ?

## 2021-11-04 NOTE — Progress Notes (Signed)
Physical Therapy Treatment ?Patient Details ?Name: Aaron Whitaker ?MRN: [id number] ?DOB: [birth year] ?Today's Date: 11/04/2021 ? ? ?History of Present Illness Pt is 81 yo male s/p R TKA on 11/03/21.  Pt with hx including but not limited to CABG, prior CVAs, HLD, arthritis, MI, ? ?  ?PT Comments  ? ? Pt is POD # 1 and is progressing well. Overall pt requiring min guard for transfers, ambulated 100', and performed stairs similar to home set up.  Pt is HOH and required multiple multimodal cues but also provided education to family and provided written handout.  Pt had taken pain meds and with improved pain control this afternoon. Pt demonstrates safe gait & transfers in order to return home from PT perspective once discharged by MD.  While in hospital, will continue to benefit from PT for skilled therapy to advance mobility and exercises.   ?   ?Recommendations for follow up therapy are one component of a multi-disciplinary discharge planning process, led by the attending physician.  Recommendations may be updated based on patient status, additional functional criteria and insurance authorization. ? ?Follow Up Recommendations ? Follow physician's recommendations for discharge plan and follow up therapies ?  ?  ?Assistance Recommended at Discharge Frequent or constant Supervision/Assistance  ?Patient can return home with the following A little help with walking and/or transfers;A little help with bathing/dressing/bathroom;Help with stairs or ramp for entrance;Assistance with cooking/housework ?  ?Equipment Recommendations ? BSC/3in1  ?  ?Recommendations for Other Services   ? ? ?  ?Precautions / Restrictions Precautions ?Precautions: Fall ?Restrictions ?Weight Bearing Restrictions: Yes ?RLE Weight Bearing: Weight bearing as tolerated  ?  ? ?Mobility ? Bed Mobility ?Overal bed mobility: Needs Assistance ?Bed Mobility: Supine to Sit ?  ?  ?Supine to sit: Min assist ?  ?  ?General bed mobility comments: Min A for R LE ?   ? ?Transfers ?Overall transfer level: Needs assistance ?Equipment used: Rolling walker (2 wheels) ?Transfers: Sit to/from Stand ?Sit to Stand: Min guard ?  ?  ?  ?  ?  ?General transfer comment: Min guard for safety; educated family on gait belt ?  ? ?Ambulation/Gait ?Ambulation/Gait assistance: Min guard ?Gait Distance (Feet): 100 Feet ?Assistive device: Rolling walker (2 wheels) ?Gait Pattern/deviations: Step-to pattern, Decreased stride length, Decreased weight shift to right ?Gait velocity: decreased ?  ?  ?General Gait Details: Min cues for posture ? ? ?Stairs ?Stairs: Yes ?Stairs assistance: Min guard ?Stair Management: Step to pattern ?Number of Stairs: 6 ?General stair comments: Started with 2 rails and step to pattern progressed to sideways with rail on R going up.  Provided multimodal cues.  Also, provided pt and family with written instruction on sequencing for sideways ? ? ?Wheelchair Mobility ?  ? ?Modified Rankin (Stroke Patients Only) ?  ? ? ?  ?Balance Overall balance assessment: Needs assistance ?Sitting-balance support: No upper extremity supported, Feet supported ?Sitting balance-Leahy Scale: Good ?  ?  ?Standing balance support: Bilateral upper extremity supported, Reliant on assistive device for balance ?Standing balance-Leahy Scale: Fair ?Standing balance comment: Requring RW but stable with RW; could static stand without AD ?  ?  ?  ?  ?  ?  ?  ?  ?  ?  ?  ?  ? ?  ?Cognition Arousal/Alertness: Awake/alert ?Behavior During Therapy: [facility] for tasks assessed/performed ?Overall Cognitive Status: Within Functional Limits for tasks assessed ?  ?  ?  ?  ?  ?  ?  ?  ?  ?  ?  ?  ?  ?  ?  ?  ?  General Comments: Does require repetition and multi-modal cues - suspect due to [facility] ?  ?  ? ?  ?Exercises Total Joint Exercises ?Ankle Circles/Pumps: AROM, Both, Supine, 10 reps ?Quad Sets: AROM, Both, Supine, 10 reps ?Hip ABduction/ADduction: AROM, Right, 10 reps, Supine ?Long Arc Quad: Right, AAROM, Seated, 10  reps ?Knee Flexion: AAROM, Right, Seated, 10 reps ?Goniometric ROM: R knee 5 to 80 ?Other Exercises ?Other Exercises: Educated on AAROM techniques as needed for exercises ? ?  ?General Comments General comments (skin integrity, edema, etc.): Pt's wife and daughter in law present throughout ?Educated on safe ice use, no pivots, car transfers, resting with leg straight, stair training, HEP - all written and covered with pt and family. Also, encouraged walking every 1-2 hours during day.  Encouraged to perform quad sets and ankle pumps frequently for blood flow and to promote full knee extension. ?  ?  ? ?Pertinent Vitals/Pain Pain Assessment ?Pain Assessment: 0-10 ?Pain Score: 3  ?Pain Location: R knee ?Pain Descriptors / Indicators: Discomfort, Grimacing ?Pain Intervention(s): Limited activity within patient's tolerance, Monitored during session, Premedicated before session  ? ? ?Home Living Family/patient expects to be discharged to:: Private residence ?Living Arrangements: Spouse/significant other ?Available Help at Discharge: Family;Available 24 hours/day ?Type of Home: House ?Home Access: Stairs to enter ?Entrance Stairs-Rails: Right;Left ?Entrance Stairs-Number of Steps: 4 ?  ?Home Layout: One level ?Home Equipment: Advice worker (2 wheels);Cane - single point ?   ?  ?Prior Function    ?  ?  ?   ? ?PT Goals (current goals can now be found in the care plan section) Acute Rehab PT Goals ?Patient Stated Goal: return home ?PT Goal Formulation: With patient/family ?Time For Goal Achievement: 11/18/21 ?Potential to Achieve Goals: Good ?Progress towards PT goals: Progressing toward goals ? ?  ?Frequency ? ? ? 7X/week ? ? ? ?  ?PT Plan Current plan remains appropriate  ? ? ?Co-evaluation   ?  ?  ?  ?  ? ?  ?AM-PAC PT "6 Clicks" Mobility   ?Outcome Measure ? Help needed turning from your back to your side while in a flat bed without using bedrails?: A Little ?Help needed moving from lying on your back to  sitting on the side of a flat bed without using bedrails?: A Little ?Help needed moving to and from a bed to a chair (including a wheelchair)?: A Little ?Help needed standing up from a chair using your arms (e.g., wheelchair or bedside chair)?: A Little ?Help needed to walk in hospital room?: A Little ?Help needed climbing 3-5 steps with a railing? : A Little ?6 Click Score: 18 ? ?  ?End of Session Equipment Utilized During Treatment: Gait belt ?Activity Tolerance: Patient tolerated treatment well ?Patient left: with chair alarm set;in chair;with call bell/phone within reach;with family/visitor present ?Nurse Communication: Mobility status ?PT Visit Diagnosis: Other abnormalities of gait and mobility (R26.89);Muscle weakness (generalized) (M62.81) ?  ? ? ?Time: 4967-5916 ?PT Time Calculation (min) (ACUTE ONLY): 34 min ? ?Charges:  $Gait Training: 8-22 mins ?$Therapeutic Exercise: 8-22 mins          ?          ? ?Abran Richard, PT ?Acute Rehab Services ?Pager [phone] ?[facility] [phone] ? ? ? ?Aaron Whitaker ?11/04/2021, 1:14 PM ? ?

## 2021-11-04 NOTE — Progress Notes (Signed)
Provided discharge education/instructions, all questions and concerns addressed, BSC delivered to room. Pt not in acute distress, discharged home with belongings accompanied by family. ?

## 2021-11-04 NOTE — Plan of Care (Signed)
?  Problem: Health Behavior/Discharge Planning: ?Goal: Ability to manage health-related needs will improve ?Outcome: Progressing ?  ?Problem: Clinical Measurements: ?Goal: Ability to maintain clinical measurements within normal limits will improve ?Outcome: Progressing ?  ?Problem: Coping: ?Goal: Level of anxiety will decrease ?Outcome: Progressing ?  ?Problem: Elimination: ?Goal: Will not experience complications related to bowel motility ?Outcome: Progressing ?  ?Problem: Pain Managment: ?Goal: General experience of comfort will improve ?Outcome: Progressing ?  ?Problem: Safety: ?Goal: Ability to remain free from injury will improve ?Outcome: Progressing ?  ?

## 2021-11-04 NOTE — Discharge Summary (Signed)
SPORTS MEDICINE & JOINT REPLACEMENT   Aaron Mulch, MD   Aaron Shadow, PA-C [facility], [place], [place]  [address]                             [phone]  PATIENT ID: Aaron Whitaker        MRN:  [id number]          DOB/AGE: [birth year] / 81 y.o.    DISCHARGE SUMMARY  ADMISSION DATE:    11/03/2021 DISCHARGE DATE:   11/04/2021   ADMISSION DIAGNOSIS: S/P total knee replacement [Z96.659]    DISCHARGE DIAGNOSIS:  Osteoarthritis of right knee M17.11    ADDITIONAL DIAGNOSIS: Principal Problem:   S/P total knee replacement  Past Medical History:  Diagnosis Date   Arthritis    Blood transfusion    Heart disease    History of kidney stones    Hx of CABG    Hyperlipidemia    Hypertension    Hyperthyroidism    Lower GI bleeding 1987   "bleeding ulcers; got 6 pints of blood"   Macular degeneration    bilateral   MI (myocardial infarction) ([place]) 03/2006   Pernicious anemia    Prostate cancer ([place]) ~2005   S/P radiation; seed implants   Skin cancer    Stroke ([place]) 2007; 12/2010, 03/2015   "light"; denies residuals, July 31,2016 most recent   Ulcerative colitis     PROCEDURE: Procedure(s): TOTAL KNEE ARTHROPLASTY on 11/03/2021  CONSULTS:    HISTORY:  See H&P in chart  HOSPITAL COURSE:  Aaron Whitaker is a 81 y.o. admitted on 11/03/2021 and found to have a diagnosis of Osteoarthritis of right knee M17.11.  After appropriate laboratory studies were obtained  they were taken to the operating room on 11/03/2021 and underwent Procedure(s): TOTAL KNEE ARTHROPLASTY.   They were given perioperative antibiotics:  Anti-infectives (From admission, onward)    Start     Dose/Rate Route Frequency Ordered Stop   11/03/21 0815  ceFAZolin (ANCEF) IVPB 2g/100 mL premix        2 g 200 mL/hr over 30 Minutes Intravenous On call to O.R. 11/03/21 0805 11/03/21 1151     .  Patient given tranexamic acid IV or topical and exparel intra-operatively.  Tolerated the  procedure well.    POD# 1: Vital signs were stable.  Patient denied Chest pain, shortness of breath, or calf pain.  Patient was started on Aspirin twice daily at 8am.  Consults to PT, OT, and care management were made.  The patient was weight bearing as tolerated.  CPM was placed on the operative leg 0-90 degrees for 6-8 hours a day. When out of the CPM, patient was placed in the foam block to achieve full extension. Incentive spirometry was taught.  Dressing was changed.       POD #2, Continued  PT for ambulation and exercise program.  IV saline locked.  O2 discontinued.    The remainder of the hospital course was dedicated to ambulation and strengthening.   The patient was discharged on 1 Day Post-Op in  Good condition.  Blood products given:none  DIAGNOSTIC STUDIES: Recent vital signs: Patient Vitals for the past 24 hrs:  BP Temp Temp src Pulse Resp SpO2  11/04/21 0935 (!) 148/74 98.1 F (36.7 C) Oral 63 18 93 %  11/04/21 0821 135/71 98.5 F (36.9 C) Oral 67 15 93 %  11/04/21 0506 (!) 146/72 98.5  F (36.9 C) -- 70 16 92 %  11/04/21 0119 130/72 98.2 F (36.8 C) -- 78 16 92 %  11/03/21 2025 132/83 98.2 F (36.8 C) -- 91 16 94 %  11/03/21 1730 (!) 146/75 (!) 97.5 F (36.4 C) Axillary 63 18 95 %  11/03/21 1600 137/78 -- -- (!) 58 20 98 %  11/03/21 1500 137/82 -- -- 68 11 99 %  11/03/21 1400 131/83 -- -- 64 12 100 %  11/03/21 1345 131/71 -- -- (!) 58 13 92 %  11/03/21 1330 129/73 -- -- 62 14 99 %  11/03/21 1317 133/80 97.6 F (36.4 C) -- 62 16 99 %       Recent laboratory studies: Recent Labs    11/04/21 0306  WBC 10.7*  HGB 8.6*  HCT 27.2*  PLT 279   Recent Labs    11/04/21 0306  NA 135  K 4.4  CL 104  CO2 24  BUN 25*  CREATININE 1.76*  GLUCOSE 168*  CALCIUM 8.4*   Lab Results  Component Value Date   INR 1.10 03/31/2015   INR 1.05 01/16/2011     Recent Radiographic Studies :  No results found.  DISCHARGE INSTRUCTIONS: Discharge Instructions     Call  MD / Call 911   Complete by: As directed    If you experience chest pain or shortness of breath, CALL 911 and be transported to the hospital emergency room.  If you develope a fever above 101 F, pus (white drainage) or increased drainage or redness at the wound, or calf pain, call your surgeon's office.   Constipation Prevention   Complete by: As directed    Drink plenty of fluids.  Prune juice may be helpful.  You may use a stool softener, such as Colace (over the counter) 100 mg twice a day.  Use MiraLax (over the counter) for constipation as needed.   Diet - low sodium heart healthy   Complete by: As directed    Discharge instructions   Complete by: As directed    INSTRUCTIONS AFTER JOINT REPLACEMENT   Remove items at home which could result in a fall. This includes throw rugs or furniture in walking pathways ICE to the affected joint every three hours while awake for 30 minutes at a time, for at least the first 3-5 days, and then as needed for pain and swelling.  Continue to use ice for pain and swelling. You may notice swelling that will progress down to the foot and ankle.  This is normal after surgery.  Elevate your leg when you are not up walking on it.   Continue to use the breathing machine you got in the hospital (incentive spirometer) which will help keep your temperature down.  It is common for your temperature to cycle up and down following surgery, especially at night when you are not up moving around and exerting yourself.  The breathing machine keeps your lungs expanded and your temperature down.   DIET:  As you were doing prior to hospitalization, we recommend a well-balanced diet.  DRESSING / WOUND CARE / SHOWERING  Keep the surgical dressing until follow up.  The dressing is water proof, so you can shower without any extra covering.  IF THE DRESSING FALLS OFF or the wound gets wet inside, change the dressing with sterile gauze.  Please use good hand washing techniques before  changing the dressing.  Do not use any lotions or creams on the incision until instructed by  your surgeon.    ACTIVITY  Increase activity slowly as tolerated, but follow the weight bearing instructions below.   No driving for 6 weeks or until further direction given by your physician.  You cannot drive while taking narcotics.  No lifting or carrying greater than 10 lbs. until further directed by your surgeon. Avoid periods of inactivity such as sitting longer than an hour when not asleep. This helps prevent blood clots.  You may return to work once you are authorized by your doctor.     WEIGHT BEARING   Weight bearing as tolerated with assist device (walker, cane, etc) as directed, use it as long as suggested by your surgeon or therapist, typically at least 4-6 weeks.   EXERCISES  Results after joint replacement surgery are often greatly improved when you follow the exercise, range of motion and muscle strengthening exercises prescribed by your doctor. Safety measures are also important to protect the joint from further injury. Any time any of these exercises cause you to have increased pain or swelling, decrease what you are doing until you are comfortable again and then slowly increase them. If you have problems or questions, call your caregiver or physical therapist for advice.   Rehabilitation is important following a joint replacement. After just a few days of immobilization, the muscles of the leg can become weakened and shrink (atrophy).  These exercises are designed to build up the tone and strength of the thigh and leg muscles and to improve motion. Often times heat used for twenty to thirty minutes before working out will loosen up your tissues and help with improving the range of motion but do not use heat for the first two weeks following surgery (sometimes heat can increase post-operative swelling).   These exercises can be done on a training (exercise) mat, on the floor, on a  table or on a bed. Use whatever works the best and is most comfortable for you.    Use music or television while you are exercising so that the exercises are a pleasant break in your day. This will make your life better with the exercises acting as a break in your routine that you can look forward to.   Perform all exercises about fifteen times, three times per day or as directed.  You should exercise both the operative leg and the other leg as well.  Exercises include:   Quad Sets - Tighten up the muscle on the front of the thigh (Quad) and hold for 5-10 seconds.   Straight Leg Raises - With your knee straight (if you were given a brace, keep it on), lift the leg to 60 degrees, hold for 3 seconds, and slowly lower the leg.  Perform this exercise against resistance later as your leg gets stronger.  Leg Slides: Lying on your back, slowly slide your foot toward your buttocks, bending your knee up off the floor (only go as far as is comfortable). Then slowly slide your foot back down until your leg is flat on the floor again.  Angel Wings: Lying on your back spread your legs to the side as far apart as you can without causing discomfort.  Hamstring Strength:  Lying on your back, push your heel against the floor with your leg straight by tightening up the muscles of your buttocks.  Repeat, but this time bend your knee to a comfortable angle, and push your heel against the floor.  You may put a pillow under the heel to make it  more comfortable if necessary.   A rehabilitation program following joint replacement surgery can speed recovery and prevent re-injury in the future due to weakened muscles. Contact your doctor or a physical therapist for more information on knee rehabilitation.    CONSTIPATION  Constipation is defined medically as fewer than three stools per week and severe constipation as less than one stool per week.  Even if you have a regular bowel pattern at home, your normal regimen is likely  to be disrupted due to multiple reasons following surgery.  Combination of anesthesia, postoperative narcotics, change in appetite and fluid intake all can affect your bowels.   YOU MUST use at least one of the following options; they are listed in order of increasing strength to get the job done.  They are all available over the counter, and you may need to use some, POSSIBLY even all of these options:    Drink plenty of fluids (prune juice may be helpful) and high fiber foods Colace 100 mg by mouth twice a day  Senokot for constipation as directed and as needed Dulcolax (bisacodyl), take with full glass of water  Miralax (polyethylene glycol) once or twice a day as needed.  If you have tried all these things and are unable to have a bowel movement in the first 3-4 days after surgery call either your surgeon or your primary doctor.    If you experience loose stools or diarrhea, hold the medications until you stool forms back up.  If your symptoms do not get better within 1 week or if they get worse, check with your doctor.  If you experience "the worst abdominal pain ever" or develop nausea or vomiting, please contact the office immediately for further recommendations for treatment.   ITCHING:  If you experience itching with your medications, try taking only a single pain pill, or even half a pain pill at a time.  You can also use Benadryl over the counter for itching or also to help with sleep.   TED HOSE STOCKINGS:  Use stockings on both legs until for at least 2 weeks or as directed by physician office. They may be removed at night for sleeping.  MEDICATIONS:  See your medication summary on the "After Visit Summary" that nursing will review with you.  You may have some home medications which will be placed on hold until you complete the course of blood thinner medication.  It is important for you to complete the blood thinner medication as prescribed.  PRECAUTIONS:  If you experience chest  pain or shortness of breath - call 911 immediately for transfer to the hospital emergency department.   If you develop a fever greater that 101 F, purulent drainage from wound, increased redness or drainage from wound, foul odor from the wound/dressing, or calf pain - CONTACT YOUR SURGEON.                                                   FOLLOW-UP APPOINTMENTS:  If you do not already have a post-op appointment, please call the office for an appointment to be seen by your surgeon.  Guidelines for how soon to be seen are listed in your "After Visit Summary", but are typically between 1-4 weeks after surgery.  OTHER INSTRUCTIONS:   Knee Replacement:  Do not place pillow under knee, focus on  keeping the knee straight while resting. CPM instructions: 0-90 degrees, 2 hours in the morning, 2 hours in the afternoon, and 2 hours in the evening. Place foam block, curve side up under heel at all times except when in CPM or when walking.  DO NOT modify, tear, cut, or change the foam block in any way.  POST-OPERATIVE OPIOID TAPER INSTRUCTIONS: It is important to wean off of your opioid medication as soon as possible. If you do not need pain medication after your surgery it is ok to stop day one. Opioids include: Codeine, Hydrocodone(Norco, Vicodin), Oxycodone(Percocet, oxycontin) and hydromorphone amongst others.  Long term and even short term use of opiods can cause: Increased pain response Dependence Constipation Depression Respiratory depression And more.  Withdrawal symptoms can include Flu like symptoms Nausea, vomiting And more Techniques to manage these symptoms Hydrate well Eat regular healthy meals Stay active Use relaxation techniques(deep breathing, meditating, yoga) Do Not substitute Alcohol to help with tapering If you have been on opioids for less than two weeks and do not have pain than it is ok to stop all together.  Plan to wean off of opioids This plan should start within one  week post op of your joint replacement. Maintain the same interval or time between taking each dose and first decrease the dose.  Cut the total daily intake of opioids by one tablet each day Next start to increase the time between doses. The last dose that should be eliminated is the evening dose.     MAKE SURE YOU:  Understand these instructions.  Get help right away if you are not doing well or get worse.    Thank you for letting us be a part of your medical care team.  It is a privilege we respect greatly.  We hope these instructions will help you stay on track for a fast and full recovery!   Increase activity slowly as tolerated   Complete by: As directed    Post-operative opioid taper instructions:   Complete by: As directed    POST-OPERATIVE OPIOID TAPER INSTRUCTIONS: It is important to wean off of your opioid medication as soon as possible. If you do not need pain medication after your surgery it is ok to stop day one. Opioids include: Codeine, Hydrocodone(Norco, Vicodin), Oxycodone(Percocet, oxycontin) and hydromorphone amongst others.  Long term and even short term use of opiods can cause: Increased pain response Dependence Constipation Depression Respiratory depression And more.  Withdrawal symptoms can include Flu like symptoms Nausea, vomiting And more Techniques to manage these symptoms Hydrate well Eat regular healthy meals Stay active Use relaxation techniques(deep breathing, meditating, yoga) Do Not substitute Alcohol to help with tapering If you have been on opioids for less than two weeks and do not have pain than it is ok to stop all together.  Plan to wean off of opioids This plan should start within one week post op of your joint replacement. Maintain the same interval or time between taking each dose and first decrease the dose.  Cut the total daily intake of opioids by one tablet each day Next start to increase the time between doses. The last dose  that should be eliminated is the evening dose.          DISCHARGE MEDICATIONS:   Allergies as of 11/04/2021   No Known Allergies      Medication List     TAKE these medications    amLODipine 5 MG tablet Commonly known as: NORVASC Take  5 mg by mouth daily.   aspirin 81 MG chewable tablet Commonly known as: Aspirin Childrens Chew 1 tablet (81 mg total) by mouth daily.   B-12 2500 MCG Tabs Take 2,500 mcg by mouth daily.   carvedilol 3.125 MG tablet Commonly known as: COREG TAKE 1 TABLET TWICE DAILY   clopidogrel 75 MG tablet Commonly known as: PLAVIX Take 75 mg by mouth daily.   gabapentin 100 MG capsule Commonly known as: NEURONTIN Take 300 mg by mouth 2 (two) times daily.   methocarbamol 500 MG tablet Commonly known as: ROBAXIN Take 1-2 tablets (500-1,000 mg total) by mouth every 6 (six) hours as needed for muscle spasms.   oxyCODONE 5 MG immediate release tablet Commonly known as: Oxy IR/ROXICODONE Take 1 tablet (5 mg total) by mouth every 4 (four) hours as needed for moderate pain (pain score 4-6).   PRESERVISION AREDS 2 PO Take by mouth 2 (two) times daily before a meal.   rosuvastatin 40 MG tablet Commonly known as: CRESTOR TAKE 1 TABLET EVERY DAY   vitamin C 500 MG tablet Commonly known as: ASCORBIC ACID Take 500 mg by mouth daily.               Durable Medical Equipment  (From admission, onward)           Start     Ordered   11/03/21 1550  DME Walker rolling  Once       Question:  Patient needs a walker to treat with the following condition  Answer:  S/P total knee replacement   11/03/21 1549   11/03/21 1550  DME 3 n 1  Once        11/03/21 1549   11/03/21 1550  DME Bedside commode  Once       Question:  Patient needs a bedside commode to treat with the following condition  Answer:  S/P total knee replacement   11/03/21 1549            FOLLOW UP VISIT:    DISPOSITION: HOME VS. SNF  Dental Antibiotics:  In most cases  prophylactic antibiotics for Dental procdeures after total joint surgery are not necessary.  Exceptions are as follows:  1. History of prior total joint infection  2. Severely immunocompromised (Organ Transplant, cancer chemotherapy, Rheumatoid biologic meds such as Cheval)  3. Poorly controlled diabetes (A1C &gt; 8.0, blood glucose over 200)  If you have one of these conditions, contact your surgeon for an antibiotic prescription, prior to your dental procedure.   CONDITION:  Good   Donia Ast 11/04/2021, 12:47 PM

## 2021-11-04 NOTE — Evaluation (Signed)
Physical Therapy Evaluation ?Patient Details ?Name: Aaron Whitaker ?MRN: [id number] ?DOB: 20-Nov-1940 ?Today's Date: 11/04/2021 ? ?History of Present Illness ? Pt is 81 yo male s/p R TKA on 11/03/21.  Pt with hx including but not limited to CABG, prior CVAs, HLD, arthritis, MI,  ?Clinical Impression ? Pt is s/p TKA resulting in the deficits listed below (see PT Problem List). At baseline, pt active and independent.  He has support from family at d/c.  At evaluation, pt ambulating 50' with RW and min guard, min A transfers.  He had good ROM and quad activation.  Pt had been trying to go without pain meds and had increased pain after session - encouraged pain meds as needed for pain control.  Will f/u later today for further therapy. Pt will benefit from skilled PT to increase their independence and safety with mobility to allow discharge to the venue listed below.  ?   ?   ? ?Recommendations for follow up therapy are one component of a multi-disciplinary discharge planning process, led by the attending physician.  Recommendations may be updated based on patient status, additional functional criteria and insurance authorization. ? ?Follow Up Recommendations Follow physician's recommendations for discharge plan and follow up therapies ? ?  ?Assistance Recommended at Discharge Frequent or constant Supervision/Assistance  ?Patient can return home with the following ? A little help with walking and/or transfers;A little help with bathing/dressing/bathroom;Help with stairs or ramp for entrance;Assistance with cooking/housework ? ?  ?Equipment Recommendations BSC/3in1  ?Recommendations for Other Services ?    ?  ?Functional Status Assessment Patient has had a recent decline in their functional status and demonstrates the ability to make significant improvements in function in a reasonable and predictable amount of time.  ? ?  ?Precautions / Restrictions Precautions ?Precautions: Fall ?Restrictions ?Weight Bearing  Restrictions: Yes ?RLE Weight Bearing: Weight bearing as tolerated  ? ?  ? ?Mobility ? Bed Mobility ?Overal bed mobility: Needs Assistance ?Bed Mobility: Supine to Sit ?  ?  ?Supine to sit: Min assist ?  ?  ?General bed mobility comments: Min A for R LE ?  ? ?Transfers ?Overall transfer level: Needs assistance ?Equipment used: Rolling walker (2 wheels) ?Transfers: Sit to/from Stand ?Sit to Stand: Min assist ?  ?  ?  ?  ?  ?General transfer comment: Cues for hand placement and R LE management with light min A to rise from low surface ?  ? ?Ambulation/Gait ?Ambulation/Gait assistance: Min guard ?Gait Distance (Feet): 60 Feet ?Assistive device: Rolling walker (2 wheels) ?Gait Pattern/deviations: Step-to pattern, Decreased stride length, Decreased weight shift to right, Antalgic ?Gait velocity: decreased ?  ?  ?General Gait Details: Cues for sequencing and RW proximity; RW height set lower than expected due to kyphotic posture and longer arms ? ?Stairs ?  ?  ?  ?  ?  ? ?Wheelchair Mobility ?  ? ?Modified Rankin (Stroke Patients Only) ?  ? ?  ? ?Balance Overall balance assessment: Needs assistance ?Sitting-balance support: No upper extremity supported, Feet supported ?Sitting balance-Leahy Scale: Good ?  ?  ?Standing balance support: Bilateral upper extremity supported, Reliant on assistive device for balance ?Standing balance-Leahy Scale: Poor ?Standing balance comment: Requring RW but stable with RW ?  ?  ?  ?  ?  ?  ?  ?  ?  ?  ?  ?   ? ? ? ?Pertinent Vitals/Pain Pain Assessment ?Pain Assessment: 0-10 ?Pain Score: 6  ?Pain Location: R knee ?Pain Descriptors /  Indicators: Discomfort, Grimacing ?Pain Intervention(s): Limited activity within patient's tolerance, Monitored during session, Patient requesting pain meds-RN notified, Ice applied (Pt initially reporting no pain , RN reports he has declined meds, after walking pt with increased pain - requested meds)  ? ? ?Home Living Family/patient expects to be discharged  to:: Private residence ?Living Arrangements: Spouse/significant other ?Available Help at Discharge: Family;Available 24 hours/day ?Type of Home: House ?Home Access: Stairs to enter ?Entrance Stairs-Rails: Right;Left ?Entrance Stairs-Number of Steps: 4 ?  ?Home Layout: One level ?Home Equipment: Advice worker (2 wheels);Cane - single point ?   ?  ?Prior Function Prior Level of Function : Independent/Modified Independent;Driving ?  ?  ?  ?  ?  ?  ?Mobility Comments: Ambulated in community without AD ?ADLs Comments: Independent with ADLs and IADLs; could push mow yard ?  ? ? ?Hand Dominance  ?   ? ?  ?Extremity/Trunk Assessment  ? Upper Extremity Assessment ?Upper Extremity Assessment: Overall WFL for tasks assessed ?  ? ?Lower Extremity Assessment ?Lower Extremity Assessment: LLE deficits/detail;RLE deficits/detail ?RLE Deficits / Details: ROM: anke and hip WFL, knee 5 to 80 degrees; MMT: ankle 5/5, knee 2/5, hip 3/5; able to SLR ?LLE Deficits / Details: very strong, ROM WFL; MMT 5/5 ?  ? ?Cervical / Trunk Assessment ?Cervical / Trunk Assessment: Kyphotic  ?Communication  ? 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  ?  ?  ?  ?  ?  ?  ?  ?  ?  ?  ?  ?  ?  ?  ?  ?  ?  ?  ? ?  ?General Comments General comments (skin integrity, edema, etc.): VSS; Educated pt on resting with leg straight, bone foam, no pivots, PT role/POC, pain control (staying on top on pain), and therapy will f/u in afternoon ? ?  ?Exercises Total Joint Exercises ?Ankle Circles/Pumps: AROM, Both, 5 reps, Supine ?Quad Sets: AROM, Both, 5 reps, Supine ?Long Arc Quad: 5 reps, Right, AAROM, Seated ?Knee Flexion: AAROM, Right, 5 reps, Seated ?Goniometric ROM: R knee 5 to 80  ? ?Assessment/Plan  ?  ?PT Assessment Patient needs continued PT services  ?PT Problem  List Decreased strength;Decreased mobility;Decreased safety awareness;Decreased range of motion;Decreased activity tolerance;Decreased balance;Decreased knowledge of use of DME;Pain ? ?   ?  ?PT Treatment Interventions DME instruction;Therapeutic activities;Modalities;Gait training;Therapeutic exercise;Patient/family education;Stair training;Functional mobility training;Balance training   ? ?PT Goals (Current goals can be found in the Care Plan section)  ?Acute Rehab PT Goals ?Patient Stated Goal: return home ?PT Goal Formulation: With patient/family ?Time For Goal Achievement: 11/18/21 ?Potential to Achieve Goals: Good ? ?  ?Frequency 7X/week ?  ? ? ?Co-evaluation   ?  ?  ?  ?  ? ? ?  ?AM-PAC PT "6 Clicks" Mobility  ?Outcome Measure Help needed turning from your back to your side while in a flat bed without using bedrails?: A Little ?Help needed moving from lying on your back to sitting on the side of a flat bed without using bedrails?: A Little ?Help needed moving to and from a bed to a chair (including a wheelchair)?: A Little ?Help needed standing up from a chair using your arms (e.g., wheelchair or bedside chair)?: A Little ?Help needed to walk in hospital room?: A Little ?Help needed climbing 3-5 steps with a railing? : A Lot ?6  Click Score: 17 ? ?  ?End of Session Equipment Utilized During Treatment: Gait belt ?Activity Tolerance: Patient tolerated treatment well ?Patient left: with chair alarm set;in chair;with call bell/phone within reach;with family/visitor present ?Nurse Communication: Mobility status ?PT Visit Diagnosis: Other abnormalities of gait and mobility (R26.89);Muscle weakness (generalized) (M62.81) ?  ? ?Time: 0160-1093 ?PT Time Calculation (min) (ACUTE ONLY): 36 min ? ? ?Charges:   PT Evaluation ?$PT Eval Low Complexity: 1 Low ?PT Treatments ?$Gait Training: 8-22 mins ?  ?   ? ? ?Abran Richard, PT ?Acute Rehab Services ?Pager [phone] ?[facility] [phone] ? ? ?Mikael Spray Tamaria Dunleavy ?11/04/2021,  12:01 PM ? ?

## 2021-11-13 DIAGNOSIS — Z96651 Presence of right artificial knee joint: Secondary | ICD-10-CM | POA: Diagnosis not present

## 2021-11-14 DIAGNOSIS — M25561 Pain in right knee: Secondary | ICD-10-CM | POA: Diagnosis not present

## 2021-11-14 DIAGNOSIS — Z96651 Presence of right artificial knee joint: Secondary | ICD-10-CM | POA: Diagnosis not present

## 2021-11-14 DIAGNOSIS — M1711 Unilateral primary osteoarthritis, right knee: Secondary | ICD-10-CM | POA: Diagnosis not present

## 2021-11-21 DIAGNOSIS — M25561 Pain in right knee: Secondary | ICD-10-CM | POA: Diagnosis not present

## 2021-11-21 DIAGNOSIS — Z96651 Presence of right artificial knee joint: Secondary | ICD-10-CM | POA: Diagnosis not present

## 2021-11-21 DIAGNOSIS — M1711 Unilateral primary osteoarthritis, right knee: Secondary | ICD-10-CM | POA: Diagnosis not present

## 2021-11-24 DIAGNOSIS — M1711 Unilateral primary osteoarthritis, right knee: Secondary | ICD-10-CM | POA: Diagnosis not present

## 2021-11-24 DIAGNOSIS — M25561 Pain in right knee: Secondary | ICD-10-CM | POA: Diagnosis not present

## 2021-11-24 DIAGNOSIS — Z96651 Presence of right artificial knee joint: Secondary | ICD-10-CM | POA: Diagnosis not present

## 2021-11-26 DIAGNOSIS — M25561 Pain in right knee: Secondary | ICD-10-CM | POA: Diagnosis not present

## 2021-11-26 DIAGNOSIS — Z96651 Presence of right artificial knee joint: Secondary | ICD-10-CM | POA: Diagnosis not present

## 2021-11-26 DIAGNOSIS — M1711 Unilateral primary osteoarthritis, right knee: Secondary | ICD-10-CM | POA: Diagnosis not present

## 2021-12-01 DIAGNOSIS — M25561 Pain in right knee: Secondary | ICD-10-CM | POA: Diagnosis not present

## 2021-12-01 DIAGNOSIS — M1711 Unilateral primary osteoarthritis, right knee: Secondary | ICD-10-CM | POA: Diagnosis not present

## 2021-12-01 DIAGNOSIS — Z96651 Presence of right artificial knee joint: Secondary | ICD-10-CM | POA: Diagnosis not present

## 2021-12-03 DIAGNOSIS — M1711 Unilateral primary osteoarthritis, right knee: Secondary | ICD-10-CM | POA: Diagnosis not present

## 2021-12-03 DIAGNOSIS — M25561 Pain in right knee: Secondary | ICD-10-CM | POA: Diagnosis not present

## 2021-12-03 DIAGNOSIS — Z96651 Presence of right artificial knee joint: Secondary | ICD-10-CM | POA: Diagnosis not present

## 2021-12-08 DIAGNOSIS — M1711 Unilateral primary osteoarthritis, right knee: Secondary | ICD-10-CM | POA: Diagnosis not present

## 2021-12-08 DIAGNOSIS — Z96651 Presence of right artificial knee joint: Secondary | ICD-10-CM | POA: Diagnosis not present

## 2021-12-08 DIAGNOSIS — M25561 Pain in right knee: Secondary | ICD-10-CM | POA: Diagnosis not present

## 2021-12-15 DIAGNOSIS — M25561 Pain in right knee: Secondary | ICD-10-CM | POA: Diagnosis not present

## 2021-12-15 DIAGNOSIS — Z96651 Presence of right artificial knee joint: Secondary | ICD-10-CM | POA: Diagnosis not present

## 2021-12-15 DIAGNOSIS — M1711 Unilateral primary osteoarthritis, right knee: Secondary | ICD-10-CM | POA: Diagnosis not present

## 2021-12-18 DIAGNOSIS — M25561 Pain in right knee: Secondary | ICD-10-CM | POA: Diagnosis not present

## 2021-12-18 DIAGNOSIS — M1711 Unilateral primary osteoarthritis, right knee: Secondary | ICD-10-CM | POA: Diagnosis not present

## 2021-12-18 DIAGNOSIS — Z96651 Presence of right artificial knee joint: Secondary | ICD-10-CM | POA: Diagnosis not present

## 2021-12-24 DIAGNOSIS — M1711 Unilateral primary osteoarthritis, right knee: Secondary | ICD-10-CM | POA: Diagnosis not present

## 2021-12-24 DIAGNOSIS — M25561 Pain in right knee: Secondary | ICD-10-CM | POA: Diagnosis not present

## 2021-12-24 DIAGNOSIS — Z96651 Presence of right artificial knee joint: Secondary | ICD-10-CM | POA: Diagnosis not present

## 2021-12-26 DIAGNOSIS — M1711 Unilateral primary osteoarthritis, right knee: Secondary | ICD-10-CM | POA: Diagnosis not present

## 2021-12-26 DIAGNOSIS — M25561 Pain in right knee: Secondary | ICD-10-CM | POA: Diagnosis not present

## 2021-12-26 DIAGNOSIS — Z471 Aftercare following joint replacement surgery: Secondary | ICD-10-CM | POA: Diagnosis not present

## 2021-12-26 DIAGNOSIS — M79604 Pain in right leg: Secondary | ICD-10-CM | POA: Diagnosis not present

## 2021-12-26 DIAGNOSIS — Z96651 Presence of right artificial knee joint: Secondary | ICD-10-CM | POA: Diagnosis not present

## 2021-12-29 DIAGNOSIS — Z96651 Presence of right artificial knee joint: Secondary | ICD-10-CM | POA: Diagnosis not present

## 2021-12-29 DIAGNOSIS — W19XXXA Unspecified fall, initial encounter: Secondary | ICD-10-CM | POA: Diagnosis not present

## 2021-12-30 DIAGNOSIS — M1711 Unilateral primary osteoarthritis, right knee: Secondary | ICD-10-CM | POA: Diagnosis not present

## 2021-12-30 DIAGNOSIS — Z96651 Presence of right artificial knee joint: Secondary | ICD-10-CM | POA: Diagnosis not present

## 2021-12-30 DIAGNOSIS — M25561 Pain in right knee: Secondary | ICD-10-CM | POA: Diagnosis not present

## 2022-01-02 DIAGNOSIS — M25561 Pain in right knee: Secondary | ICD-10-CM | POA: Diagnosis not present

## 2022-01-02 DIAGNOSIS — Z96651 Presence of right artificial knee joint: Secondary | ICD-10-CM | POA: Diagnosis not present

## 2022-01-02 DIAGNOSIS — M1711 Unilateral primary osteoarthritis, right knee: Secondary | ICD-10-CM | POA: Diagnosis not present

## 2022-01-06 DIAGNOSIS — Z96651 Presence of right artificial knee joint: Secondary | ICD-10-CM | POA: Diagnosis not present

## 2022-01-06 DIAGNOSIS — M1711 Unilateral primary osteoarthritis, right knee: Secondary | ICD-10-CM | POA: Diagnosis not present

## 2022-01-06 DIAGNOSIS — M25561 Pain in right knee: Secondary | ICD-10-CM | POA: Diagnosis not present

## 2022-01-09 DIAGNOSIS — R2689 Other abnormalities of gait and mobility: Secondary | ICD-10-CM | POA: Diagnosis not present

## 2022-01-09 DIAGNOSIS — M1711 Unilateral primary osteoarthritis, right knee: Secondary | ICD-10-CM | POA: Diagnosis not present

## 2022-01-09 DIAGNOSIS — Z96651 Presence of right artificial knee joint: Secondary | ICD-10-CM | POA: Diagnosis not present

## 2022-01-09 DIAGNOSIS — M25561 Pain in right knee: Secondary | ICD-10-CM | POA: Diagnosis not present

## 2022-01-13 DIAGNOSIS — R2689 Other abnormalities of gait and mobility: Secondary | ICD-10-CM | POA: Diagnosis not present

## 2022-01-13 DIAGNOSIS — M25561 Pain in right knee: Secondary | ICD-10-CM | POA: Diagnosis not present

## 2022-01-13 DIAGNOSIS — Z96651 Presence of right artificial knee joint: Secondary | ICD-10-CM | POA: Diagnosis not present

## 2022-01-13 DIAGNOSIS — M1711 Unilateral primary osteoarthritis, right knee: Secondary | ICD-10-CM | POA: Diagnosis not present

## 2022-01-16 DIAGNOSIS — M1711 Unilateral primary osteoarthritis, right knee: Secondary | ICD-10-CM | POA: Diagnosis not present

## 2022-01-16 DIAGNOSIS — Z96651 Presence of right artificial knee joint: Secondary | ICD-10-CM | POA: Diagnosis not present

## 2022-01-16 DIAGNOSIS — M25561 Pain in right knee: Secondary | ICD-10-CM | POA: Diagnosis not present

## 2022-01-16 DIAGNOSIS — R2689 Other abnormalities of gait and mobility: Secondary | ICD-10-CM | POA: Diagnosis not present

## 2022-01-19 DIAGNOSIS — R2689 Other abnormalities of gait and mobility: Secondary | ICD-10-CM | POA: Diagnosis not present

## 2022-01-19 DIAGNOSIS — Z96651 Presence of right artificial knee joint: Secondary | ICD-10-CM | POA: Diagnosis not present

## 2022-01-19 DIAGNOSIS — M1711 Unilateral primary osteoarthritis, right knee: Secondary | ICD-10-CM | POA: Diagnosis not present

## 2022-01-19 DIAGNOSIS — M25561 Pain in right knee: Secondary | ICD-10-CM | POA: Diagnosis not present

## 2022-01-23 DIAGNOSIS — M1711 Unilateral primary osteoarthritis, right knee: Secondary | ICD-10-CM | POA: Diagnosis not present

## 2022-01-23 DIAGNOSIS — M25561 Pain in right knee: Secondary | ICD-10-CM | POA: Diagnosis not present

## 2022-01-23 DIAGNOSIS — Z96651 Presence of right artificial knee joint: Secondary | ICD-10-CM | POA: Diagnosis not present

## 2022-01-23 DIAGNOSIS — R2689 Other abnormalities of gait and mobility: Secondary | ICD-10-CM | POA: Diagnosis not present

## 2022-01-28 DIAGNOSIS — Z96651 Presence of right artificial knee joint: Secondary | ICD-10-CM | POA: Diagnosis not present

## 2022-01-28 DIAGNOSIS — M25561 Pain in right knee: Secondary | ICD-10-CM | POA: Diagnosis not present

## 2022-01-28 DIAGNOSIS — R2689 Other abnormalities of gait and mobility: Secondary | ICD-10-CM | POA: Diagnosis not present

## 2022-01-28 DIAGNOSIS — M1711 Unilateral primary osteoarthritis, right knee: Secondary | ICD-10-CM | POA: Diagnosis not present

## 2022-02-06 DIAGNOSIS — M1711 Unilateral primary osteoarthritis, right knee: Secondary | ICD-10-CM | POA: Diagnosis not present

## 2022-02-06 DIAGNOSIS — R2689 Other abnormalities of gait and mobility: Secondary | ICD-10-CM | POA: Diagnosis not present

## 2022-02-06 DIAGNOSIS — M25561 Pain in right knee: Secondary | ICD-10-CM | POA: Diagnosis not present

## 2022-02-06 DIAGNOSIS — Z96651 Presence of right artificial knee joint: Secondary | ICD-10-CM | POA: Diagnosis not present

## 2022-02-11 DIAGNOSIS — M25561 Pain in right knee: Secondary | ICD-10-CM | POA: Diagnosis not present

## 2022-02-11 DIAGNOSIS — M1711 Unilateral primary osteoarthritis, right knee: Secondary | ICD-10-CM | POA: Diagnosis not present

## 2022-02-11 DIAGNOSIS — Z96651 Presence of right artificial knee joint: Secondary | ICD-10-CM | POA: Diagnosis not present

## 2022-02-11 DIAGNOSIS — R2689 Other abnormalities of gait and mobility: Secondary | ICD-10-CM | POA: Diagnosis not present

## 2022-02-18 DIAGNOSIS — M25561 Pain in right knee: Secondary | ICD-10-CM | POA: Diagnosis not present

## 2022-02-18 DIAGNOSIS — R2689 Other abnormalities of gait and mobility: Secondary | ICD-10-CM | POA: Diagnosis not present

## 2022-02-18 DIAGNOSIS — M1711 Unilateral primary osteoarthritis, right knee: Secondary | ICD-10-CM | POA: Diagnosis not present

## 2022-02-18 DIAGNOSIS — Z96651 Presence of right artificial knee joint: Secondary | ICD-10-CM | POA: Diagnosis not present

## 2022-02-24 DIAGNOSIS — Z96651 Presence of right artificial knee joint: Secondary | ICD-10-CM | POA: Diagnosis not present

## 2022-02-24 DIAGNOSIS — R2689 Other abnormalities of gait and mobility: Secondary | ICD-10-CM | POA: Diagnosis not present

## 2022-02-24 DIAGNOSIS — M1711 Unilateral primary osteoarthritis, right knee: Secondary | ICD-10-CM | POA: Diagnosis not present

## 2022-02-24 DIAGNOSIS — M25561 Pain in right knee: Secondary | ICD-10-CM | POA: Diagnosis not present

## 2022-02-26 DIAGNOSIS — Z96651 Presence of right artificial knee joint: Secondary | ICD-10-CM | POA: Diagnosis not present

## 2022-03-20 ENCOUNTER — Encounter: Payer: Self-pay | Admitting: Cardiology

## 2022-03-20 ENCOUNTER — Ambulatory Visit: Payer: Medicare Other | Admitting: Cardiology

## 2022-03-20 VITALS — BP 140/73 | HR 64 | Temp 98.1°F | Resp 17 | Ht 70.5 in | Wt 165.6 lb

## 2022-03-20 DIAGNOSIS — E7800 Pure hypercholesterolemia, unspecified: Secondary | ICD-10-CM | POA: Diagnosis not present

## 2022-03-20 DIAGNOSIS — I2510 Atherosclerotic heart disease of native coronary artery without angina pectoris: Secondary | ICD-10-CM | POA: Diagnosis not present

## 2022-03-20 DIAGNOSIS — I10 Essential (primary) hypertension: Secondary | ICD-10-CM | POA: Diagnosis not present

## 2022-03-20 DIAGNOSIS — Z8774 Personal history of (corrected) congenital malformations of heart and circulatory system: Secondary | ICD-10-CM | POA: Diagnosis not present

## 2022-03-20 NOTE — Progress Notes (Signed)
Primary Physician/Referring:  Cyndi Bender, PA-C  Patient ID: Aaron Whitaker, male    DOB: [birth year], 81 y.o.   MRN: [id number]  Chief Complaint  Patient presents with   Coronary Artery Disease    1 YEAR   Hypertension   Hyperlipidemia   HPI:    HPI: Aaron Whitaker  is a 81 y.o. Caucasian male with history of hypertension, hyperlipidemia, and history of PFO closure in 2012 for secondary prevention of stroke, as well as CAD with history of MI/CABG (bypass x5) on 03/29/06 with LIMA to LAD, SVG to OM1 and OM 2, SVG to PDA and PLA during which he suffered a perioperative stroke.  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 A 30 mm Amplatzer PFO septal occluder was deployed, with successful repair of the residual ASD. Double contrast study revealed no residual shunting. Loop recorder evaluated for 2 years did not reveal A. Fib.  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  He now presents for annual visit, remains asymptomatic from CAD standpoint however he has slowed down significantly over the past 1 year.  He is much slower to respond.  His wife is also concerned about gradual decline in overall health.  Past Medical History:  Diagnosis Date   Arthritis    Blood transfusion    Heart disease    History of kidney stones    Hx of CABG    Hyperlipidemia    Hypertension    Hyperthyroidism    Lower GI bleeding 1987   "bleeding ulcers; got 6 pints of blood"   Macular degeneration    bilateral   MI (myocardial infarction) ([place]) 03/2006   Pernicious anemia    Prostate cancer ([place]) ~2005   S/P radiation; seed implants   Skin cancer    Stroke ([place]) 2007; 12/2010, 03/2015   "light"; denies residuals, July 31,2016 most recent   Ulcerative colitis    Past Surgical  History:  Procedure Laterality Date   CARDIAC CATHETERIZATION N/A 09/10/2015   Procedure: ASD/VSD Closure;  Surgeon: [name], MD;  Location: [facility];  Service: Cardiovascular;  Laterality: N/A;   CORONARY ARTERY BYPASS GRAFT  02/2006   CABG X 5   EP IMPLANTABLE DEVICE N/A 04/03/2015   Procedure: Loop Recorder Insertion;  Surgeon: Evans Lance, MD;  Location: [facility];  Service: Cardiovascular;  Laterality: N/A;   GASTRIC RESECTION  ~ 1987   "had ~ 90% of my stomach removed"   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   KNEE ARTHROSCOPY     bilaterally; "long long time ago" (11/17/11)   PATENT FORAMEN OVALE CLOSURE  03/2015   PATENT FORAMEN OVALE CLOSURE N/A 11/17/2011   Procedure: PATENT FORAMEN OVALE CLOSURE;  Surgeon: Laverda Page, MD;  Location: [facility];  Service: Cardiovascular;  Laterality: N/A;   TEE WITHOUT CARDIOVERSION N/A 04/03/2015   Procedure: TRANSESOPHAGEAL ECHOCARDIOGRAM (TEE);  Surgeon: Lelon Perla, MD;  Location: [facility];  Service: Cardiovascular;  Laterality: N/A;   TOTAL KNEE ARTHROPLASTY Right 11/03/2021   Procedure: TOTAL KNEE ARTHROPLASTY;  Surgeon: Vickey Huger, MD;  Location: [facility];  Service: Orthopedics;  Laterality: Right;   Social History   Tobacco Use   Smoking status: Former    Packs/day: 0.50  Years: 8.00    Total pack years: 4.00    Types: Cigarettes    Quit date: 08/31/1965    Years since quitting: 56.5   Smokeless tobacco: Current    Types: Chew   Tobacco comments:    2 pouches daily  Substance Use Topics   Alcohol use: No    Alcohol/week: 0.0 standard drinks of alcohol  Marital Status: Married   ROS  Review of Systems  Cardiovascular:  Negative for chest pain, dyspnea on exertion and leg swelling.  Musculoskeletal:  Positive for joint pain.  Gastrointestinal:  Negative for melena.   Objective  Blood pressure 140/73, pulse 64, temperature 98.1 F (36.7 C), temperature source Temporal, resp. rate 17, height  5' 10.5" (1.791 m), weight 165 lb 9.6 oz (75.1 kg), SpO2 96 %. Body mass index is 23.43 kg/m.    03/20/2022    2:24 PM 03/20/2022    2:22 PM 11/04/2021    9:35 AM  Vitals with BMI  Height  5' 10.5"   Weight  165 lbs 10 oz   BMI  16.10   Systolic 960 454 098  Diastolic 73 75 74  Pulse 64 55 63    Physical Exam Constitutional:      General: He is not in acute distress.    Appearance: Normal appearance. He is well-developed.  Neck:     Thyroid: No thyromegaly.     Vascular: No JVD.  Cardiovascular:     Rate and Rhythm: Normal rate and regular rhythm.     Pulses: Intact distal pulses.     Heart sounds: Normal heart sounds. No murmur heard.    No gallop.  Pulmonary:     Effort: Pulmonary effort is normal.     Breath sounds: Normal breath sounds.  Abdominal:     General: Bowel sounds are normal.     Palpations: Abdomen is soft.  Musculoskeletal:     Right lower leg: Edema (1-2+ ankle pitting) present.     Left lower leg: Edema (1-2+ ankle pitting) present.    Radiology: No results found.  Laboratory examination:    External labs:  Labs 09/03/2020:  Total cholesterol 120, triglycerides 110, HDL 36, LDL 64.  Serum glucose 85 mg, BUN 20, creatinine 1.88, EGFR 33 mL, sodium 141, potassium 4.6.  CMP otherwise normal.  Hb 13.5/HCT 40.2, platelets 224.  Creatinine, Serum 1.760 mg/ 03/05/2020   Medications   Current Outpatient Medications:    amLODipine (NORVASC) 5 MG tablet, Take 5 mg by mouth daily., Disp: , Rfl:    aspirin (ASPIRIN CHILDRENS) 81 MG chewable tablet, Chew 1 tablet (81 mg total) by mouth daily., Disp: , Rfl:    Bioflavonoid Products (ESTER C PO), Take by mouth., Disp: , Rfl:    carvedilol (COREG) 3.125 MG tablet, TAKE 1 TABLET TWICE DAILY, Disp: 180 tablet, Rfl: 3   clopidogrel (PLAVIX) 75 MG tablet, Take 75 mg by mouth daily., Disp: , Rfl:    Cyanocobalamin (B-12) 2500 MCG TABS, Take 2,500 mcg by mouth daily., Disp: , Rfl:    gabapentin (NEURONTIN) 100 MG  capsule, Take 300 mg by mouth 2 (two) times daily., Disp: , Rfl:    Multiple Vitamins-Minerals (PRESERVISION AREDS 2 PO), Take by mouth 2 (two) times daily before a meal., Disp: , Rfl:    Omega-3 Fatty Acids (FISH OIL) 1000 MG CAPS, Take 1 capsule by mouth daily., Disp: , Rfl:    rosuvastatin (CRESTOR) 40 MG tablet, TAKE 1 TABLET EVERY DAY, Disp: 90  tablet, Rfl: 3   vitamin C (ASCORBIC ACID) 500 MG tablet, Take 500 mg by mouth daily., Disp: , Rfl:    Cardiac Studies:   Echocardiogram 09/11/2015 :   Left ventricle: The cavity size was normal. There was mild focal   basal hypertrophy of the septum. Systolic function was normal.   The estimated ejection fraction was in the range of 50% to 55%.   Regional wall motion abnormalities cannot be excluded. The study   is not technically sufficient to allow evaluation of LV diastolic   function. - Aortic valve: There was mild regurgitation. - Right ventricle: The cavity size was mildly dilated. 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 No residual shunting by color Doppler evaluation. The occluder   appears to be in good position. the right atrial disk appears   slightly splayed.  EKG 03/20/2022: Sinus rhythm with first-degree block at rate of 61 bpm, inferior infarct old.  Nonspecific T abnormality.  Single PVC.  No significant change from 03/20/2021.   Assessment     ICD-10-CM   1. Coronary artery disease involving native coronary artery of native heart without angina pectoris  I25.10     2. Primary hypertension  I10 EKG 12-Lead    3. H/O congenital atrial septal defect (ASD) repair  Z87.74     4. Hypercholesteremia  E78.00       Medications Discontinued During This Encounter  Medication Reason   oxyCODONE (OXY IR/ROXICODONE) 5 MG immediate release tablet No longer needed (for PRN medications)   methocarbamol (ROBAXIN) 500 MG tablet No longer needed (for PRN medications)     No orders of the defined types were placed in this  encounter.   Recommendations:   Alanson Hausmann  is a 81 y.o. Caucasian male with history of hypertension, hyperlipidemia, and CAD with history of MI/CABG (bypass x5) on 03/29/06 with LIMA to LAD, SVG to OM1 and OM 2, SVG to PDA and PLA during which he suffered a perioperative stroke,  history of PFO closure in 2012 for secondary prevention of stroke. 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 A 30 mm Amplatzer PFO septal occluder.  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  He now presents for annual visit, remains asymptomatic from CAD standpoint however he has slowed down significantly over the past 1 year.  He is much slower to respond.  Advised the patient's wife to look into dementia work-up as well.  From a hypertension standpoint and coronary artery disease standpoint he is doing well.  EKG no changes.  He has not had recurrence of TIA-like symptoms since ASD repair.  No changes in physical exam.  I do not have his recent labs with regard to lipid management but this is being done by his PCP.  He is presently on a high intensity statin and tolerating this well.  I will see him back on an annual basis.    [name], MD, [facility] 03/20/2022, 2:54 PM Office: [phone]

## 2022-05-12 DIAGNOSIS — G609 Hereditary and idiopathic neuropathy, unspecified: Secondary | ICD-10-CM | POA: Diagnosis not present

## 2022-05-12 DIAGNOSIS — Z1331 Encounter for screening for depression: Secondary | ICD-10-CM | POA: Diagnosis not present

## 2022-05-12 DIAGNOSIS — D469 Myelodysplastic syndrome, unspecified: Secondary | ICD-10-CM | POA: Diagnosis not present

## 2022-05-12 DIAGNOSIS — I69359 Hemiplegia and hemiparesis following cerebral infarction affecting unspecified side: Secondary | ICD-10-CM | POA: Diagnosis not present

## 2022-05-12 DIAGNOSIS — N184 Chronic kidney disease, stage 4 (severe): Secondary | ICD-10-CM | POA: Diagnosis not present

## 2022-05-12 DIAGNOSIS — I10 Essential (primary) hypertension: Secondary | ICD-10-CM | POA: Diagnosis not present

## 2022-05-12 DIAGNOSIS — E538 Deficiency of other specified B group vitamins: Secondary | ICD-10-CM | POA: Diagnosis not present

## 2022-05-12 DIAGNOSIS — I2510 Atherosclerotic heart disease of native coronary artery without angina pectoris: Secondary | ICD-10-CM | POA: Diagnosis not present

## 2022-05-16 DIAGNOSIS — L821 Other seborrheic keratosis: Secondary | ICD-10-CM | POA: Diagnosis not present

## 2022-05-16 DIAGNOSIS — D225 Melanocytic nevi of trunk: Secondary | ICD-10-CM | POA: Diagnosis not present

## 2022-05-16 DIAGNOSIS — L570 Actinic keratosis: Secondary | ICD-10-CM | POA: Diagnosis not present

## 2022-05-16 DIAGNOSIS — D485 Neoplasm of uncertain behavior of skin: Secondary | ICD-10-CM | POA: Diagnosis not present

## 2022-05-16 DIAGNOSIS — D2239 Melanocytic nevi of other parts of face: Secondary | ICD-10-CM | POA: Diagnosis not present

## 2022-06-09 DIAGNOSIS — H3530 Unspecified macular degeneration: Secondary | ICD-10-CM | POA: Diagnosis not present

## 2022-06-09 DIAGNOSIS — H2703 Aphakia, bilateral: Secondary | ICD-10-CM | POA: Diagnosis not present

## 2022-07-02 DIAGNOSIS — N184 Chronic kidney disease, stage 4 (severe): Secondary | ICD-10-CM | POA: Diagnosis not present

## 2022-07-02 DIAGNOSIS — N189 Chronic kidney disease, unspecified: Secondary | ICD-10-CM | POA: Diagnosis not present

## 2022-07-02 DIAGNOSIS — I129 Hypertensive chronic kidney disease with stage 1 through stage 4 chronic kidney disease, or unspecified chronic kidney disease: Secondary | ICD-10-CM | POA: Diagnosis not present

## 2022-07-02 DIAGNOSIS — D631 Anemia in chronic kidney disease: Secondary | ICD-10-CM | POA: Diagnosis not present

## 2022-07-02 DIAGNOSIS — N2581 Secondary hyperparathyroidism of renal origin: Secondary | ICD-10-CM | POA: Diagnosis not present

## 2022-07-07 DIAGNOSIS — D469 Myelodysplastic syndrome, unspecified: Secondary | ICD-10-CM | POA: Diagnosis not present

## 2022-07-07 DIAGNOSIS — I10 Essential (primary) hypertension: Secondary | ICD-10-CM | POA: Diagnosis not present

## 2022-07-07 DIAGNOSIS — R269 Unspecified abnormalities of gait and mobility: Secondary | ICD-10-CM | POA: Diagnosis not present

## 2022-07-07 DIAGNOSIS — Z111 Encounter for screening for respiratory tuberculosis: Secondary | ICD-10-CM | POA: Diagnosis not present

## 2022-07-07 DIAGNOSIS — I69359 Hemiplegia and hemiparesis following cerebral infarction affecting unspecified side: Secondary | ICD-10-CM | POA: Diagnosis not present

## 2022-07-07 DIAGNOSIS — G609 Hereditary and idiopathic neuropathy, unspecified: Secondary | ICD-10-CM | POA: Diagnosis not present

## 2022-07-07 DIAGNOSIS — I2510 Atherosclerotic heart disease of native coronary artery without angina pectoris: Secondary | ICD-10-CM | POA: Diagnosis not present

## 2022-07-07 DIAGNOSIS — N184 Chronic kidney disease, stage 4 (severe): Secondary | ICD-10-CM | POA: Diagnosis not present

## 2022-07-07 DIAGNOSIS — E538 Deficiency of other specified B group vitamins: Secondary | ICD-10-CM | POA: Diagnosis not present

## 2022-07-24 DIAGNOSIS — Z8711 Personal history of peptic ulcer disease: Secondary | ICD-10-CM | POA: Diagnosis not present

## 2022-07-24 DIAGNOSIS — I252 Old myocardial infarction: Secondary | ICD-10-CM | POA: Diagnosis not present

## 2022-07-24 DIAGNOSIS — Z79899 Other long term (current) drug therapy: Secondary | ICD-10-CM | POA: Diagnosis not present

## 2022-07-24 DIAGNOSIS — G039 Meningitis, unspecified: Secondary | ICD-10-CM | POA: Diagnosis not present

## 2022-07-24 DIAGNOSIS — I4891 Unspecified atrial fibrillation: Secondary | ICD-10-CM | POA: Diagnosis not present

## 2022-07-24 DIAGNOSIS — Z1152 Encounter for screening for COVID-19: Secondary | ICD-10-CM | POA: Diagnosis not present

## 2022-07-24 DIAGNOSIS — Z87891 Personal history of nicotine dependence: Secondary | ICD-10-CM | POA: Diagnosis not present

## 2022-07-24 DIAGNOSIS — N179 Acute kidney failure, unspecified: Secondary | ICD-10-CM | POA: Diagnosis not present

## 2022-07-24 DIAGNOSIS — I700 Atherosclerosis of aorta: Secondary | ICD-10-CM | POA: Diagnosis not present

## 2022-07-24 DIAGNOSIS — I493 Ventricular premature depolarization: Secondary | ICD-10-CM | POA: Diagnosis not present

## 2022-07-24 DIAGNOSIS — M4854XA Collapsed vertebra, not elsewhere classified, thoracic region, initial encounter for fracture: Secondary | ICD-10-CM | POA: Diagnosis not present

## 2022-07-24 DIAGNOSIS — E7800 Pure hypercholesterolemia, unspecified: Secondary | ICD-10-CM | POA: Diagnosis not present

## 2022-07-24 DIAGNOSIS — R5381 Other malaise: Secondary | ICD-10-CM | POA: Diagnosis not present

## 2022-07-24 DIAGNOSIS — R4182 Altered mental status, unspecified: Secondary | ICD-10-CM | POA: Diagnosis not present

## 2022-07-24 DIAGNOSIS — G9341 Metabolic encephalopathy: Secondary | ICD-10-CM | POA: Diagnosis not present

## 2022-07-24 DIAGNOSIS — Z8673 Personal history of transient ischemic attack (TIA), and cerebral infarction without residual deficits: Secondary | ICD-10-CM | POA: Diagnosis not present

## 2022-07-24 DIAGNOSIS — I129 Hypertensive chronic kidney disease with stage 1 through stage 4 chronic kidney disease, or unspecified chronic kidney disease: Secondary | ICD-10-CM | POA: Diagnosis not present

## 2022-07-24 DIAGNOSIS — D469 Myelodysplastic syndrome, unspecified: Secondary | ICD-10-CM | POA: Diagnosis present

## 2022-07-24 DIAGNOSIS — R000 Tachycardia, unspecified: Secondary | ICD-10-CM | POA: Diagnosis not present

## 2022-07-24 DIAGNOSIS — R410 Disorientation, unspecified: Secondary | ICD-10-CM | POA: Diagnosis not present

## 2022-07-24 DIAGNOSIS — N200 Calculus of kidney: Secondary | ICD-10-CM | POA: Diagnosis not present

## 2022-07-24 DIAGNOSIS — J8410 Pulmonary fibrosis, unspecified: Secondary | ICD-10-CM | POA: Diagnosis not present

## 2022-07-24 DIAGNOSIS — I714 Abdominal aortic aneurysm, without rupture, unspecified: Secondary | ICD-10-CM | POA: Diagnosis not present

## 2022-07-24 DIAGNOSIS — K5730 Diverticulosis of large intestine without perforation or abscess without bleeding: Secondary | ICD-10-CM | POA: Diagnosis not present

## 2022-07-24 DIAGNOSIS — R509 Fever, unspecified: Secondary | ICD-10-CM | POA: Diagnosis not present

## 2022-07-24 DIAGNOSIS — Z7982 Long term (current) use of aspirin: Secondary | ICD-10-CM | POA: Diagnosis not present

## 2022-07-24 DIAGNOSIS — Z7902 Long term (current) use of antithrombotics/antiplatelets: Secondary | ICD-10-CM | POA: Diagnosis not present

## 2022-07-24 DIAGNOSIS — K8689 Other specified diseases of pancreas: Secondary | ICD-10-CM | POA: Diagnosis not present

## 2022-07-24 DIAGNOSIS — N189 Chronic kidney disease, unspecified: Secondary | ICD-10-CM | POA: Diagnosis present

## 2022-07-24 DIAGNOSIS — M1990 Unspecified osteoarthritis, unspecified site: Secondary | ICD-10-CM | POA: Diagnosis present

## 2022-07-24 DIAGNOSIS — I2510 Atherosclerotic heart disease of native coronary artery without angina pectoris: Secondary | ICD-10-CM | POA: Diagnosis not present

## 2022-07-24 DIAGNOSIS — R6510 Systemic inflammatory response syndrome (SIRS) of non-infectious origin without acute organ dysfunction: Secondary | ICD-10-CM | POA: Diagnosis not present

## 2022-07-24 DIAGNOSIS — Z7401 Bed confinement status: Secondary | ICD-10-CM | POA: Diagnosis not present

## 2022-07-24 DIAGNOSIS — R402431 Glasgow coma scale score 3-8, in the field [EMT or ambulance]: Secondary | ICD-10-CM | POA: Diagnosis not present

## 2022-07-24 DIAGNOSIS — F0390 Unspecified dementia without behavioral disturbance: Secondary | ICD-10-CM | POA: Diagnosis present

## 2022-07-24 DIAGNOSIS — Z951 Presence of aortocoronary bypass graft: Secondary | ICD-10-CM | POA: Diagnosis not present

## 2022-08-03 DIAGNOSIS — D469 Myelodysplastic syndrome, unspecified: Secondary | ICD-10-CM | POA: Diagnosis not present

## 2022-08-03 DIAGNOSIS — Z8673 Personal history of transient ischemic attack (TIA), and cerebral infarction without residual deficits: Secondary | ICD-10-CM | POA: Diagnosis not present

## 2022-08-03 DIAGNOSIS — Z7982 Long term (current) use of aspirin: Secondary | ICD-10-CM | POA: Diagnosis not present

## 2022-08-03 DIAGNOSIS — Z951 Presence of aortocoronary bypass graft: Secondary | ICD-10-CM | POA: Diagnosis not present

## 2022-08-03 DIAGNOSIS — R6510 Systemic inflammatory response syndrome (SIRS) of non-infectious origin without acute organ dysfunction: Secondary | ICD-10-CM | POA: Diagnosis not present

## 2022-08-03 DIAGNOSIS — I2510 Atherosclerotic heart disease of native coronary artery without angina pectoris: Secondary | ICD-10-CM | POA: Diagnosis not present

## 2022-08-03 DIAGNOSIS — Z7902 Long term (current) use of antithrombotics/antiplatelets: Secondary | ICD-10-CM | POA: Diagnosis not present

## 2022-08-03 DIAGNOSIS — I4891 Unspecified atrial fibrillation: Secondary | ICD-10-CM | POA: Diagnosis not present

## 2022-08-03 DIAGNOSIS — E7800 Pure hypercholesterolemia, unspecified: Secondary | ICD-10-CM | POA: Diagnosis not present

## 2022-08-03 DIAGNOSIS — B954 Other streptococcus as the cause of diseases classified elsewhere: Secondary | ICD-10-CM | POA: Diagnosis not present

## 2022-08-03 DIAGNOSIS — I129 Hypertensive chronic kidney disease with stage 1 through stage 4 chronic kidney disease, or unspecified chronic kidney disease: Secondary | ICD-10-CM | POA: Diagnosis not present

## 2022-08-03 DIAGNOSIS — N179 Acute kidney failure, unspecified: Secondary | ICD-10-CM | POA: Diagnosis not present

## 2022-08-03 DIAGNOSIS — G9341 Metabolic encephalopathy: Secondary | ICD-10-CM | POA: Diagnosis not present

## 2022-08-03 DIAGNOSIS — I252 Old myocardial infarction: Secondary | ICD-10-CM | POA: Diagnosis not present

## 2022-08-03 DIAGNOSIS — Z87891 Personal history of nicotine dependence: Secondary | ICD-10-CM | POA: Diagnosis not present

## 2022-08-03 DIAGNOSIS — N189 Chronic kidney disease, unspecified: Secondary | ICD-10-CM | POA: Diagnosis not present

## 2022-08-03 DIAGNOSIS — Z8711 Personal history of peptic ulcer disease: Secondary | ICD-10-CM | POA: Diagnosis not present

## 2022-08-03 DIAGNOSIS — Z9181 History of falling: Secondary | ICD-10-CM | POA: Diagnosis not present

## 2022-08-03 DIAGNOSIS — Z604 Social exclusion and rejection: Secondary | ICD-10-CM | POA: Diagnosis not present

## 2022-08-03 DIAGNOSIS — M1990 Unspecified osteoarthritis, unspecified site: Secondary | ICD-10-CM | POA: Diagnosis not present

## 2022-08-04 DIAGNOSIS — I129 Hypertensive chronic kidney disease with stage 1 through stage 4 chronic kidney disease, or unspecified chronic kidney disease: Secondary | ICD-10-CM | POA: Diagnosis not present

## 2022-08-04 DIAGNOSIS — N179 Acute kidney failure, unspecified: Secondary | ICD-10-CM | POA: Diagnosis not present

## 2022-08-04 DIAGNOSIS — R6510 Systemic inflammatory response syndrome (SIRS) of non-infectious origin without acute organ dysfunction: Secondary | ICD-10-CM | POA: Diagnosis not present

## 2022-08-04 DIAGNOSIS — B954 Other streptococcus as the cause of diseases classified elsewhere: Secondary | ICD-10-CM | POA: Diagnosis not present

## 2022-08-04 DIAGNOSIS — G9341 Metabolic encephalopathy: Secondary | ICD-10-CM | POA: Diagnosis not present

## 2022-08-04 DIAGNOSIS — D469 Myelodysplastic syndrome, unspecified: Secondary | ICD-10-CM | POA: Diagnosis not present

## 2022-08-06 DIAGNOSIS — F0390 Unspecified dementia without behavioral disturbance: Secondary | ICD-10-CM | POA: Diagnosis not present

## 2022-08-06 DIAGNOSIS — E538 Deficiency of other specified B group vitamins: Secondary | ICD-10-CM | POA: Diagnosis not present

## 2022-08-06 DIAGNOSIS — N189 Chronic kidney disease, unspecified: Secondary | ICD-10-CM | POA: Diagnosis not present

## 2022-08-06 DIAGNOSIS — D46Z Other myelodysplastic syndromes: Secondary | ICD-10-CM | POA: Diagnosis not present

## 2022-08-06 DIAGNOSIS — I2510 Atherosclerotic heart disease of native coronary artery without angina pectoris: Secondary | ICD-10-CM | POA: Diagnosis not present

## 2022-08-06 DIAGNOSIS — E785 Hyperlipidemia, unspecified: Secondary | ICD-10-CM | POA: Diagnosis not present

## 2022-08-06 DIAGNOSIS — I10 Essential (primary) hypertension: Secondary | ICD-10-CM | POA: Diagnosis not present

## 2022-08-06 DIAGNOSIS — G609 Hereditary and idiopathic neuropathy, unspecified: Secondary | ICD-10-CM | POA: Diagnosis not present

## 2022-08-07 DIAGNOSIS — N179 Acute kidney failure, unspecified: Secondary | ICD-10-CM | POA: Diagnosis not present

## 2022-08-07 DIAGNOSIS — B954 Other streptococcus as the cause of diseases classified elsewhere: Secondary | ICD-10-CM | POA: Diagnosis not present

## 2022-08-07 DIAGNOSIS — D469 Myelodysplastic syndrome, unspecified: Secondary | ICD-10-CM | POA: Diagnosis not present

## 2022-08-07 DIAGNOSIS — R6510 Systemic inflammatory response syndrome (SIRS) of non-infectious origin without acute organ dysfunction: Secondary | ICD-10-CM | POA: Diagnosis not present

## 2022-08-07 DIAGNOSIS — I129 Hypertensive chronic kidney disease with stage 1 through stage 4 chronic kidney disease, or unspecified chronic kidney disease: Secondary | ICD-10-CM | POA: Diagnosis not present

## 2022-08-07 DIAGNOSIS — G9341 Metabolic encephalopathy: Secondary | ICD-10-CM | POA: Diagnosis not present

## 2022-08-10 DIAGNOSIS — N179 Acute kidney failure, unspecified: Secondary | ICD-10-CM | POA: Diagnosis not present

## 2022-08-10 DIAGNOSIS — B954 Other streptococcus as the cause of diseases classified elsewhere: Secondary | ICD-10-CM | POA: Diagnosis not present

## 2022-08-10 DIAGNOSIS — G9341 Metabolic encephalopathy: Secondary | ICD-10-CM | POA: Diagnosis not present

## 2022-08-10 DIAGNOSIS — R6510 Systemic inflammatory response syndrome (SIRS) of non-infectious origin without acute organ dysfunction: Secondary | ICD-10-CM | POA: Diagnosis not present

## 2022-08-10 DIAGNOSIS — D469 Myelodysplastic syndrome, unspecified: Secondary | ICD-10-CM | POA: Diagnosis not present

## 2022-08-10 DIAGNOSIS — I129 Hypertensive chronic kidney disease with stage 1 through stage 4 chronic kidney disease, or unspecified chronic kidney disease: Secondary | ICD-10-CM | POA: Diagnosis not present

## 2022-08-12 DIAGNOSIS — I129 Hypertensive chronic kidney disease with stage 1 through stage 4 chronic kidney disease, or unspecified chronic kidney disease: Secondary | ICD-10-CM | POA: Diagnosis not present

## 2022-08-12 DIAGNOSIS — D469 Myelodysplastic syndrome, unspecified: Secondary | ICD-10-CM | POA: Diagnosis not present

## 2022-08-12 DIAGNOSIS — N179 Acute kidney failure, unspecified: Secondary | ICD-10-CM | POA: Diagnosis not present

## 2022-08-12 DIAGNOSIS — G9341 Metabolic encephalopathy: Secondary | ICD-10-CM | POA: Diagnosis not present

## 2022-08-12 DIAGNOSIS — B954 Other streptococcus as the cause of diseases classified elsewhere: Secondary | ICD-10-CM | POA: Diagnosis not present

## 2022-08-12 DIAGNOSIS — R6510 Systemic inflammatory response syndrome (SIRS) of non-infectious origin without acute organ dysfunction: Secondary | ICD-10-CM | POA: Diagnosis not present

## 2022-08-14 DIAGNOSIS — B954 Other streptococcus as the cause of diseases classified elsewhere: Secondary | ICD-10-CM | POA: Diagnosis not present

## 2022-08-14 DIAGNOSIS — D469 Myelodysplastic syndrome, unspecified: Secondary | ICD-10-CM | POA: Diagnosis not present

## 2022-08-14 DIAGNOSIS — D2239 Melanocytic nevi of other parts of face: Secondary | ICD-10-CM | POA: Diagnosis not present

## 2022-08-14 DIAGNOSIS — G9341 Metabolic encephalopathy: Secondary | ICD-10-CM | POA: Diagnosis not present

## 2022-08-14 DIAGNOSIS — L570 Actinic keratosis: Secondary | ICD-10-CM | POA: Diagnosis not present

## 2022-08-14 DIAGNOSIS — R6510 Systemic inflammatory response syndrome (SIRS) of non-infectious origin without acute organ dysfunction: Secondary | ICD-10-CM | POA: Diagnosis not present

## 2022-08-14 DIAGNOSIS — F01A0 Vascular dementia, mild, without behavioral disturbance, psychotic disturbance, mood disturbance, and anxiety: Secondary | ICD-10-CM | POA: Diagnosis not present

## 2022-08-14 DIAGNOSIS — L853 Xerosis cutis: Secondary | ICD-10-CM | POA: Diagnosis not present

## 2022-08-14 DIAGNOSIS — N179 Acute kidney failure, unspecified: Secondary | ICD-10-CM | POA: Diagnosis not present

## 2022-08-14 DIAGNOSIS — I129 Hypertensive chronic kidney disease with stage 1 through stage 4 chronic kidney disease, or unspecified chronic kidney disease: Secondary | ICD-10-CM | POA: Diagnosis not present

## 2022-08-14 DIAGNOSIS — D485 Neoplasm of uncertain behavior of skin: Secondary | ICD-10-CM | POA: Diagnosis not present

## 2022-08-14 DIAGNOSIS — L820 Inflamed seborrheic keratosis: Secondary | ICD-10-CM | POA: Diagnosis not present

## 2022-08-14 DIAGNOSIS — D225 Melanocytic nevi of trunk: Secondary | ICD-10-CM | POA: Diagnosis not present

## 2022-08-18 DIAGNOSIS — B954 Other streptococcus as the cause of diseases classified elsewhere: Secondary | ICD-10-CM | POA: Diagnosis not present

## 2022-08-18 DIAGNOSIS — D469 Myelodysplastic syndrome, unspecified: Secondary | ICD-10-CM | POA: Diagnosis not present

## 2022-08-18 DIAGNOSIS — I129 Hypertensive chronic kidney disease with stage 1 through stage 4 chronic kidney disease, or unspecified chronic kidney disease: Secondary | ICD-10-CM | POA: Diagnosis not present

## 2022-08-18 DIAGNOSIS — G9341 Metabolic encephalopathy: Secondary | ICD-10-CM | POA: Diagnosis not present

## 2022-08-18 DIAGNOSIS — R6510 Systemic inflammatory response syndrome (SIRS) of non-infectious origin without acute organ dysfunction: Secondary | ICD-10-CM | POA: Diagnosis not present

## 2022-08-18 DIAGNOSIS — N179 Acute kidney failure, unspecified: Secondary | ICD-10-CM | POA: Diagnosis not present

## 2022-08-19 DIAGNOSIS — D469 Myelodysplastic syndrome, unspecified: Secondary | ICD-10-CM | POA: Diagnosis not present

## 2022-08-19 DIAGNOSIS — C4359 Malignant melanoma of other part of trunk: Secondary | ICD-10-CM | POA: Diagnosis not present

## 2022-08-19 DIAGNOSIS — B954 Other streptococcus as the cause of diseases classified elsewhere: Secondary | ICD-10-CM | POA: Diagnosis not present

## 2022-08-19 DIAGNOSIS — R6510 Systemic inflammatory response syndrome (SIRS) of non-infectious origin without acute organ dysfunction: Secondary | ICD-10-CM | POA: Diagnosis not present

## 2022-08-19 DIAGNOSIS — N179 Acute kidney failure, unspecified: Secondary | ICD-10-CM | POA: Diagnosis not present

## 2022-08-19 DIAGNOSIS — G9341 Metabolic encephalopathy: Secondary | ICD-10-CM | POA: Diagnosis not present

## 2022-08-19 DIAGNOSIS — I129 Hypertensive chronic kidney disease with stage 1 through stage 4 chronic kidney disease, or unspecified chronic kidney disease: Secondary | ICD-10-CM | POA: Diagnosis not present

## 2022-08-21 DIAGNOSIS — D469 Myelodysplastic syndrome, unspecified: Secondary | ICD-10-CM | POA: Diagnosis not present

## 2022-08-21 DIAGNOSIS — G9341 Metabolic encephalopathy: Secondary | ICD-10-CM | POA: Diagnosis not present

## 2022-08-21 DIAGNOSIS — N179 Acute kidney failure, unspecified: Secondary | ICD-10-CM | POA: Diagnosis not present

## 2022-08-21 DIAGNOSIS — I129 Hypertensive chronic kidney disease with stage 1 through stage 4 chronic kidney disease, or unspecified chronic kidney disease: Secondary | ICD-10-CM | POA: Diagnosis not present

## 2022-08-21 DIAGNOSIS — R6510 Systemic inflammatory response syndrome (SIRS) of non-infectious origin without acute organ dysfunction: Secondary | ICD-10-CM | POA: Diagnosis not present

## 2022-08-21 DIAGNOSIS — B954 Other streptococcus as the cause of diseases classified elsewhere: Secondary | ICD-10-CM | POA: Diagnosis not present

## 2022-08-27 DIAGNOSIS — D469 Myelodysplastic syndrome, unspecified: Secondary | ICD-10-CM | POA: Diagnosis not present

## 2022-08-27 DIAGNOSIS — B954 Other streptococcus as the cause of diseases classified elsewhere: Secondary | ICD-10-CM | POA: Diagnosis not present

## 2022-08-27 DIAGNOSIS — N179 Acute kidney failure, unspecified: Secondary | ICD-10-CM | POA: Diagnosis not present

## 2022-08-27 DIAGNOSIS — R6510 Systemic inflammatory response syndrome (SIRS) of non-infectious origin without acute organ dysfunction: Secondary | ICD-10-CM | POA: Diagnosis not present

## 2022-08-27 DIAGNOSIS — G9341 Metabolic encephalopathy: Secondary | ICD-10-CM | POA: Diagnosis not present

## 2022-08-27 DIAGNOSIS — I129 Hypertensive chronic kidney disease with stage 1 through stage 4 chronic kidney disease, or unspecified chronic kidney disease: Secondary | ICD-10-CM | POA: Diagnosis not present

## 2022-08-28 DIAGNOSIS — R6510 Systemic inflammatory response syndrome (SIRS) of non-infectious origin without acute organ dysfunction: Secondary | ICD-10-CM | POA: Diagnosis not present

## 2022-08-28 DIAGNOSIS — N179 Acute kidney failure, unspecified: Secondary | ICD-10-CM | POA: Diagnosis not present

## 2022-08-28 DIAGNOSIS — D469 Myelodysplastic syndrome, unspecified: Secondary | ICD-10-CM | POA: Diagnosis not present

## 2022-08-28 DIAGNOSIS — G9341 Metabolic encephalopathy: Secondary | ICD-10-CM | POA: Diagnosis not present

## 2022-08-28 DIAGNOSIS — I129 Hypertensive chronic kidney disease with stage 1 through stage 4 chronic kidney disease, or unspecified chronic kidney disease: Secondary | ICD-10-CM | POA: Diagnosis not present

## 2022-08-28 DIAGNOSIS — B954 Other streptococcus as the cause of diseases classified elsewhere: Secondary | ICD-10-CM | POA: Diagnosis not present

## 2022-08-30 DIAGNOSIS — N189 Chronic kidney disease, unspecified: Secondary | ICD-10-CM | POA: Diagnosis not present

## 2022-08-30 DIAGNOSIS — I10 Essential (primary) hypertension: Secondary | ICD-10-CM | POA: Diagnosis not present

## 2022-08-30 DIAGNOSIS — I2510 Atherosclerotic heart disease of native coronary artery without angina pectoris: Secondary | ICD-10-CM | POA: Diagnosis not present

## 2022-09-01 DIAGNOSIS — D469 Myelodysplastic syndrome, unspecified: Secondary | ICD-10-CM | POA: Diagnosis not present

## 2022-09-01 DIAGNOSIS — R011 Cardiac murmur, unspecified: Secondary | ICD-10-CM | POA: Diagnosis not present

## 2022-09-01 DIAGNOSIS — N179 Acute kidney failure, unspecified: Secondary | ICD-10-CM | POA: Diagnosis not present

## 2022-09-01 DIAGNOSIS — G9341 Metabolic encephalopathy: Secondary | ICD-10-CM | POA: Diagnosis not present

## 2022-09-01 DIAGNOSIS — R6510 Systemic inflammatory response syndrome (SIRS) of non-infectious origin without acute organ dysfunction: Secondary | ICD-10-CM | POA: Diagnosis not present

## 2022-09-01 DIAGNOSIS — I129 Hypertensive chronic kidney disease with stage 1 through stage 4 chronic kidney disease, or unspecified chronic kidney disease: Secondary | ICD-10-CM | POA: Diagnosis not present

## 2022-09-01 DIAGNOSIS — B954 Other streptococcus as the cause of diseases classified elsewhere: Secondary | ICD-10-CM | POA: Diagnosis not present

## 2022-09-02 DIAGNOSIS — N189 Chronic kidney disease, unspecified: Secondary | ICD-10-CM | POA: Diagnosis not present

## 2022-09-02 DIAGNOSIS — R6510 Systemic inflammatory response syndrome (SIRS) of non-infectious origin without acute organ dysfunction: Secondary | ICD-10-CM | POA: Diagnosis not present

## 2022-09-02 DIAGNOSIS — G9341 Metabolic encephalopathy: Secondary | ICD-10-CM | POA: Diagnosis not present

## 2022-09-02 DIAGNOSIS — I4891 Unspecified atrial fibrillation: Secondary | ICD-10-CM | POA: Diagnosis not present

## 2022-09-02 DIAGNOSIS — E7800 Pure hypercholesterolemia, unspecified: Secondary | ICD-10-CM | POA: Diagnosis not present

## 2022-09-02 DIAGNOSIS — I129 Hypertensive chronic kidney disease with stage 1 through stage 4 chronic kidney disease, or unspecified chronic kidney disease: Secondary | ICD-10-CM | POA: Diagnosis not present

## 2022-09-02 DIAGNOSIS — I252 Old myocardial infarction: Secondary | ICD-10-CM | POA: Diagnosis not present

## 2022-09-02 DIAGNOSIS — Z9181 History of falling: Secondary | ICD-10-CM | POA: Diagnosis not present

## 2022-09-02 DIAGNOSIS — D469 Myelodysplastic syndrome, unspecified: Secondary | ICD-10-CM | POA: Diagnosis not present

## 2022-09-02 DIAGNOSIS — Z7902 Long term (current) use of antithrombotics/antiplatelets: Secondary | ICD-10-CM | POA: Diagnosis not present

## 2022-09-02 DIAGNOSIS — Z604 Social exclusion and rejection: Secondary | ICD-10-CM | POA: Diagnosis not present

## 2022-09-02 DIAGNOSIS — Z8711 Personal history of peptic ulcer disease: Secondary | ICD-10-CM | POA: Diagnosis not present

## 2022-09-02 DIAGNOSIS — Z7982 Long term (current) use of aspirin: Secondary | ICD-10-CM | POA: Diagnosis not present

## 2022-09-02 DIAGNOSIS — Z8673 Personal history of transient ischemic attack (TIA), and cerebral infarction without residual deficits: Secondary | ICD-10-CM | POA: Diagnosis not present

## 2022-09-02 DIAGNOSIS — Z951 Presence of aortocoronary bypass graft: Secondary | ICD-10-CM | POA: Diagnosis not present

## 2022-09-02 DIAGNOSIS — M1990 Unspecified osteoarthritis, unspecified site: Secondary | ICD-10-CM | POA: Diagnosis not present

## 2022-09-02 DIAGNOSIS — I2510 Atherosclerotic heart disease of native coronary artery without angina pectoris: Secondary | ICD-10-CM | POA: Diagnosis not present

## 2022-09-02 DIAGNOSIS — B954 Other streptococcus as the cause of diseases classified elsewhere: Secondary | ICD-10-CM | POA: Diagnosis not present

## 2022-09-02 DIAGNOSIS — N179 Acute kidney failure, unspecified: Secondary | ICD-10-CM | POA: Diagnosis not present

## 2022-09-02 DIAGNOSIS — Z87891 Personal history of nicotine dependence: Secondary | ICD-10-CM | POA: Diagnosis not present

## 2022-09-03 DIAGNOSIS — R0989 Other specified symptoms and signs involving the circulatory and respiratory systems: Secondary | ICD-10-CM | POA: Diagnosis not present

## 2022-09-03 DIAGNOSIS — I6523 Occlusion and stenosis of bilateral carotid arteries: Secondary | ICD-10-CM | POA: Diagnosis not present

## 2022-09-04 DIAGNOSIS — N189 Chronic kidney disease, unspecified: Secondary | ICD-10-CM | POA: Diagnosis not present

## 2022-09-04 DIAGNOSIS — I10 Essential (primary) hypertension: Secondary | ICD-10-CM | POA: Diagnosis not present

## 2022-09-04 DIAGNOSIS — I2510 Atherosclerotic heart disease of native coronary artery without angina pectoris: Secondary | ICD-10-CM | POA: Diagnosis not present

## 2022-09-09 DIAGNOSIS — R590 Localized enlarged lymph nodes: Secondary | ICD-10-CM | POA: Diagnosis not present

## 2022-09-09 DIAGNOSIS — F01A0 Vascular dementia, mild, without behavioral disturbance, psychotic disturbance, mood disturbance, and anxiety: Secondary | ICD-10-CM | POA: Diagnosis not present

## 2022-09-10 DIAGNOSIS — B954 Other streptococcus as the cause of diseases classified elsewhere: Secondary | ICD-10-CM | POA: Diagnosis not present

## 2022-09-10 DIAGNOSIS — I129 Hypertensive chronic kidney disease with stage 1 through stage 4 chronic kidney disease, or unspecified chronic kidney disease: Secondary | ICD-10-CM | POA: Diagnosis not present

## 2022-09-10 DIAGNOSIS — N179 Acute kidney failure, unspecified: Secondary | ICD-10-CM | POA: Diagnosis not present

## 2022-09-10 DIAGNOSIS — D469 Myelodysplastic syndrome, unspecified: Secondary | ICD-10-CM | POA: Diagnosis not present

## 2022-09-10 DIAGNOSIS — R6510 Systemic inflammatory response syndrome (SIRS) of non-infectious origin without acute organ dysfunction: Secondary | ICD-10-CM | POA: Diagnosis not present

## 2022-09-10 DIAGNOSIS — I77811 Abdominal aortic ectasia: Secondary | ICD-10-CM | POA: Diagnosis not present

## 2022-09-10 DIAGNOSIS — G9341 Metabolic encephalopathy: Secondary | ICD-10-CM | POA: Diagnosis not present

## 2022-09-15 DIAGNOSIS — D469 Myelodysplastic syndrome, unspecified: Secondary | ICD-10-CM | POA: Diagnosis not present

## 2022-09-15 DIAGNOSIS — I129 Hypertensive chronic kidney disease with stage 1 through stage 4 chronic kidney disease, or unspecified chronic kidney disease: Secondary | ICD-10-CM | POA: Diagnosis not present

## 2022-09-15 DIAGNOSIS — B954 Other streptococcus as the cause of diseases classified elsewhere: Secondary | ICD-10-CM | POA: Diagnosis not present

## 2022-09-15 DIAGNOSIS — G9341 Metabolic encephalopathy: Secondary | ICD-10-CM | POA: Diagnosis not present

## 2022-09-15 DIAGNOSIS — N179 Acute kidney failure, unspecified: Secondary | ICD-10-CM | POA: Diagnosis not present

## 2022-09-15 DIAGNOSIS — R6510 Systemic inflammatory response syndrome (SIRS) of non-infectious origin without acute organ dysfunction: Secondary | ICD-10-CM | POA: Diagnosis not present

## 2022-09-16 DIAGNOSIS — I70223 Atherosclerosis of native arteries of extremities with rest pain, bilateral legs: Secondary | ICD-10-CM | POA: Diagnosis not present

## 2022-09-17 DIAGNOSIS — R221 Localized swelling, mass and lump, neck: Secondary | ICD-10-CM | POA: Diagnosis not present

## 2022-09-17 DIAGNOSIS — E042 Nontoxic multinodular goiter: Secondary | ICD-10-CM | POA: Diagnosis not present

## 2022-09-18 DIAGNOSIS — F0390 Unspecified dementia without behavioral disturbance: Secondary | ICD-10-CM | POA: Diagnosis not present

## 2022-09-18 DIAGNOSIS — I2510 Atherosclerotic heart disease of native coronary artery without angina pectoris: Secondary | ICD-10-CM | POA: Diagnosis not present

## 2022-09-18 DIAGNOSIS — I10 Essential (primary) hypertension: Secondary | ICD-10-CM | POA: Diagnosis not present

## 2022-09-18 DIAGNOSIS — E785 Hyperlipidemia, unspecified: Secondary | ICD-10-CM | POA: Diagnosis not present

## 2022-09-18 DIAGNOSIS — N189 Chronic kidney disease, unspecified: Secondary | ICD-10-CM | POA: Diagnosis not present

## 2022-09-18 DIAGNOSIS — M1990 Unspecified osteoarthritis, unspecified site: Secondary | ICD-10-CM | POA: Diagnosis not present

## 2022-09-22 DIAGNOSIS — D469 Myelodysplastic syndrome, unspecified: Secondary | ICD-10-CM | POA: Diagnosis not present

## 2022-09-22 DIAGNOSIS — I129 Hypertensive chronic kidney disease with stage 1 through stage 4 chronic kidney disease, or unspecified chronic kidney disease: Secondary | ICD-10-CM | POA: Diagnosis not present

## 2022-09-22 DIAGNOSIS — G9341 Metabolic encephalopathy: Secondary | ICD-10-CM | POA: Diagnosis not present

## 2022-09-22 DIAGNOSIS — B954 Other streptococcus as the cause of diseases classified elsewhere: Secondary | ICD-10-CM | POA: Diagnosis not present

## 2022-09-22 DIAGNOSIS — R6510 Systemic inflammatory response syndrome (SIRS) of non-infectious origin without acute organ dysfunction: Secondary | ICD-10-CM | POA: Diagnosis not present

## 2022-09-22 DIAGNOSIS — N179 Acute kidney failure, unspecified: Secondary | ICD-10-CM | POA: Diagnosis not present

## 2022-09-24 DIAGNOSIS — D469 Myelodysplastic syndrome, unspecified: Secondary | ICD-10-CM | POA: Diagnosis not present

## 2022-09-24 DIAGNOSIS — B954 Other streptococcus as the cause of diseases classified elsewhere: Secondary | ICD-10-CM | POA: Diagnosis not present

## 2022-09-24 DIAGNOSIS — I129 Hypertensive chronic kidney disease with stage 1 through stage 4 chronic kidney disease, or unspecified chronic kidney disease: Secondary | ICD-10-CM | POA: Diagnosis not present

## 2022-09-24 DIAGNOSIS — G9341 Metabolic encephalopathy: Secondary | ICD-10-CM | POA: Diagnosis not present

## 2022-09-24 DIAGNOSIS — R2232 Localized swelling, mass and lump, left upper limb: Secondary | ICD-10-CM | POA: Diagnosis not present

## 2022-09-24 DIAGNOSIS — R6510 Systemic inflammatory response syndrome (SIRS) of non-infectious origin without acute organ dysfunction: Secondary | ICD-10-CM | POA: Diagnosis not present

## 2022-09-24 DIAGNOSIS — N179 Acute kidney failure, unspecified: Secondary | ICD-10-CM | POA: Diagnosis not present

## 2022-09-28 DIAGNOSIS — G9341 Metabolic encephalopathy: Secondary | ICD-10-CM | POA: Diagnosis not present

## 2022-09-28 DIAGNOSIS — B954 Other streptococcus as the cause of diseases classified elsewhere: Secondary | ICD-10-CM | POA: Diagnosis not present

## 2022-09-28 DIAGNOSIS — N179 Acute kidney failure, unspecified: Secondary | ICD-10-CM | POA: Diagnosis not present

## 2022-09-28 DIAGNOSIS — D469 Myelodysplastic syndrome, unspecified: Secondary | ICD-10-CM | POA: Diagnosis not present

## 2022-09-28 DIAGNOSIS — I129 Hypertensive chronic kidney disease with stage 1 through stage 4 chronic kidney disease, or unspecified chronic kidney disease: Secondary | ICD-10-CM | POA: Diagnosis not present

## 2022-09-28 DIAGNOSIS — R6510 Systemic inflammatory response syndrome (SIRS) of non-infectious origin without acute organ dysfunction: Secondary | ICD-10-CM | POA: Diagnosis not present

## 2022-10-05 ENCOUNTER — Telehealth: Payer: Self-pay

## 2022-10-05 DIAGNOSIS — N189 Chronic kidney disease, unspecified: Secondary | ICD-10-CM | POA: Diagnosis not present

## 2022-10-05 DIAGNOSIS — D045 Carcinoma in situ of skin of trunk: Secondary | ICD-10-CM | POA: Diagnosis not present

## 2022-10-05 DIAGNOSIS — C44519 Basal cell carcinoma of skin of other part of trunk: Secondary | ICD-10-CM | POA: Diagnosis not present

## 2022-10-05 DIAGNOSIS — I10 Essential (primary) hypertension: Secondary | ICD-10-CM | POA: Diagnosis not present

## 2022-10-05 DIAGNOSIS — C44529 Squamous cell carcinoma of skin of other part of trunk: Secondary | ICD-10-CM | POA: Diagnosis not present

## 2022-10-05 DIAGNOSIS — C4359 Malignant melanoma of other part of trunk: Secondary | ICD-10-CM | POA: Diagnosis not present

## 2022-10-05 DIAGNOSIS — I2510 Atherosclerotic heart disease of native coronary artery without angina pectoris: Secondary | ICD-10-CM | POA: Diagnosis not present

## 2022-10-05 NOTE — Patient Outreach (Signed)
  Care Coordination   Initial Visit Note   10/05/2022 Name: Aaron Whitaker MRN: [id number] DOB: 05/16/1941  Aaron Whitaker is a 82 y.o. year old male who sees Aaron Whitaker, [place] for primary care. I spoke with  Aaron Whitaker by phone today.  What matters to the patients health and wellness today?  Placed call to patient today to review and offer [facility] care coordination program.  Patient reports that he is in a nursing home.      SDOH assessments and interventions completed:  No     Care Coordination Interventions:  No, not indicated   Follow up plan: No further intervention required.   Encounter Outcome:  Pt. Visit Completed   Aaron Rand, RN, BSN, CEN [place] Coordinator [phone]

## 2022-10-07 DIAGNOSIS — F01A0 Vascular dementia, mild, without behavioral disturbance, psychotic disturbance, mood disturbance, and anxiety: Secondary | ICD-10-CM | POA: Diagnosis not present

## 2022-10-14 DIAGNOSIS — C44311 Basal cell carcinoma of skin of nose: Secondary | ICD-10-CM | POA: Diagnosis not present

## 2022-10-14 DIAGNOSIS — L820 Inflamed seborrheic keratosis: Secondary | ICD-10-CM | POA: Diagnosis not present

## 2022-10-21 DIAGNOSIS — F01A0 Vascular dementia, mild, without behavioral disturbance, psychotic disturbance, mood disturbance, and anxiety: Secondary | ICD-10-CM | POA: Diagnosis not present

## 2022-10-25 DIAGNOSIS — I10 Essential (primary) hypertension: Secondary | ICD-10-CM | POA: Diagnosis not present

## 2022-11-02 DIAGNOSIS — E538 Deficiency of other specified B group vitamins: Secondary | ICD-10-CM | POA: Diagnosis not present

## 2022-11-02 DIAGNOSIS — I10 Essential (primary) hypertension: Secondary | ICD-10-CM | POA: Diagnosis not present

## 2022-11-02 DIAGNOSIS — I2510 Atherosclerotic heart disease of native coronary artery without angina pectoris: Secondary | ICD-10-CM | POA: Diagnosis not present

## 2022-11-02 DIAGNOSIS — G609 Hereditary and idiopathic neuropathy, unspecified: Secondary | ICD-10-CM | POA: Diagnosis not present

## 2022-11-02 DIAGNOSIS — N189 Chronic kidney disease, unspecified: Secondary | ICD-10-CM | POA: Diagnosis not present

## 2022-11-02 DIAGNOSIS — M1990 Unspecified osteoarthritis, unspecified site: Secondary | ICD-10-CM | POA: Diagnosis not present

## 2022-11-02 DIAGNOSIS — F0390 Unspecified dementia without behavioral disturbance: Secondary | ICD-10-CM | POA: Diagnosis not present

## 2022-11-02 DIAGNOSIS — E785 Hyperlipidemia, unspecified: Secondary | ICD-10-CM | POA: Diagnosis not present

## 2022-11-03 DIAGNOSIS — N184 Chronic kidney disease, stage 4 (severe): Secondary | ICD-10-CM | POA: Diagnosis not present

## 2022-11-03 DIAGNOSIS — N189 Chronic kidney disease, unspecified: Secondary | ICD-10-CM | POA: Diagnosis not present

## 2022-11-05 DIAGNOSIS — N184 Chronic kidney disease, stage 4 (severe): Secondary | ICD-10-CM | POA: Diagnosis not present

## 2022-11-05 DIAGNOSIS — E559 Vitamin D deficiency, unspecified: Secondary | ICD-10-CM | POA: Diagnosis not present

## 2022-11-05 DIAGNOSIS — N2581 Secondary hyperparathyroidism of renal origin: Secondary | ICD-10-CM | POA: Diagnosis not present

## 2022-11-05 DIAGNOSIS — D631 Anemia in chronic kidney disease: Secondary | ICD-10-CM | POA: Diagnosis not present

## 2022-11-05 DIAGNOSIS — I129 Hypertensive chronic kidney disease with stage 1 through stage 4 chronic kidney disease, or unspecified chronic kidney disease: Secondary | ICD-10-CM | POA: Diagnosis not present

## 2022-11-06 DIAGNOSIS — E038 Other specified hypothyroidism: Secondary | ICD-10-CM | POA: Diagnosis not present

## 2022-11-06 DIAGNOSIS — E782 Mixed hyperlipidemia: Secondary | ICD-10-CM | POA: Diagnosis not present

## 2022-11-06 DIAGNOSIS — I70223 Atherosclerosis of native arteries of extremities with rest pain, bilateral legs: Secondary | ICD-10-CM | POA: Diagnosis not present

## 2022-11-06 DIAGNOSIS — N189 Chronic kidney disease, unspecified: Secondary | ICD-10-CM | POA: Diagnosis not present

## 2022-11-06 DIAGNOSIS — I10 Essential (primary) hypertension: Secondary | ICD-10-CM | POA: Diagnosis not present

## 2022-11-06 DIAGNOSIS — D518 Other vitamin B12 deficiency anemias: Secondary | ICD-10-CM | POA: Diagnosis not present

## 2022-11-06 DIAGNOSIS — I2510 Atherosclerotic heart disease of native coronary artery without angina pectoris: Secondary | ICD-10-CM | POA: Diagnosis not present

## 2022-11-06 DIAGNOSIS — E119 Type 2 diabetes mellitus without complications: Secondary | ICD-10-CM | POA: Diagnosis not present

## 2022-11-10 DIAGNOSIS — E782 Mixed hyperlipidemia: Secondary | ICD-10-CM | POA: Diagnosis not present

## 2022-11-10 DIAGNOSIS — Z79899 Other long term (current) drug therapy: Secondary | ICD-10-CM | POA: Diagnosis not present

## 2022-11-10 DIAGNOSIS — E038 Other specified hypothyroidism: Secondary | ICD-10-CM | POA: Diagnosis not present

## 2022-11-10 DIAGNOSIS — D518 Other vitamin B12 deficiency anemias: Secondary | ICD-10-CM | POA: Diagnosis not present

## 2022-11-10 DIAGNOSIS — E119 Type 2 diabetes mellitus without complications: Secondary | ICD-10-CM | POA: Diagnosis not present

## 2022-11-13 DIAGNOSIS — R600 Localized edema: Secondary | ICD-10-CM | POA: Diagnosis not present

## 2022-11-20 DIAGNOSIS — N189 Chronic kidney disease, unspecified: Secondary | ICD-10-CM | POA: Diagnosis not present

## 2022-11-20 DIAGNOSIS — M1990 Unspecified osteoarthritis, unspecified site: Secondary | ICD-10-CM | POA: Diagnosis not present

## 2022-11-20 DIAGNOSIS — G609 Hereditary and idiopathic neuropathy, unspecified: Secondary | ICD-10-CM | POA: Diagnosis not present

## 2022-11-20 DIAGNOSIS — D46Z Other myelodysplastic syndromes: Secondary | ICD-10-CM | POA: Diagnosis not present

## 2022-11-20 DIAGNOSIS — I2510 Atherosclerotic heart disease of native coronary artery without angina pectoris: Secondary | ICD-10-CM | POA: Diagnosis not present

## 2022-11-20 DIAGNOSIS — F0390 Unspecified dementia without behavioral disturbance: Secondary | ICD-10-CM | POA: Diagnosis not present

## 2022-11-20 DIAGNOSIS — I10 Essential (primary) hypertension: Secondary | ICD-10-CM | POA: Diagnosis not present

## 2022-11-26 DIAGNOSIS — Z96651 Presence of right artificial knee joint: Secondary | ICD-10-CM | POA: Diagnosis not present

## 2022-11-26 DIAGNOSIS — R296 Repeated falls: Secondary | ICD-10-CM | POA: Diagnosis not present

## 2022-11-26 DIAGNOSIS — Z471 Aftercare following joint replacement surgery: Secondary | ICD-10-CM | POA: Diagnosis not present

## 2022-11-26 DIAGNOSIS — M7989 Other specified soft tissue disorders: Secondary | ICD-10-CM | POA: Diagnosis not present

## 2022-11-26 DIAGNOSIS — M25562 Pain in left knee: Secondary | ICD-10-CM | POA: Diagnosis not present

## 2022-11-26 DIAGNOSIS — M170 Bilateral primary osteoarthritis of knee: Secondary | ICD-10-CM | POA: Diagnosis not present

## 2022-12-04 DIAGNOSIS — I10 Essential (primary) hypertension: Secondary | ICD-10-CM | POA: Diagnosis not present

## 2022-12-04 DIAGNOSIS — D518 Other vitamin B12 deficiency anemias: Secondary | ICD-10-CM | POA: Diagnosis not present

## 2022-12-04 DIAGNOSIS — I70223 Atherosclerosis of native arteries of extremities with rest pain, bilateral legs: Secondary | ICD-10-CM | POA: Diagnosis not present

## 2022-12-04 DIAGNOSIS — E038 Other specified hypothyroidism: Secondary | ICD-10-CM | POA: Diagnosis not present

## 2022-12-04 DIAGNOSIS — E119 Type 2 diabetes mellitus without complications: Secondary | ICD-10-CM | POA: Diagnosis not present

## 2022-12-04 DIAGNOSIS — N189 Chronic kidney disease, unspecified: Secondary | ICD-10-CM | POA: Diagnosis not present

## 2022-12-04 DIAGNOSIS — I2510 Atherosclerotic heart disease of native coronary artery without angina pectoris: Secondary | ICD-10-CM | POA: Diagnosis not present

## 2022-12-04 DIAGNOSIS — E782 Mixed hyperlipidemia: Secondary | ICD-10-CM | POA: Diagnosis not present

## 2022-12-09 DIAGNOSIS — D225 Melanocytic nevi of trunk: Secondary | ICD-10-CM | POA: Diagnosis not present

## 2022-12-09 DIAGNOSIS — F01A0 Vascular dementia, mild, without behavioral disturbance, psychotic disturbance, mood disturbance, and anxiety: Secondary | ICD-10-CM | POA: Diagnosis not present

## 2022-12-09 DIAGNOSIS — C44529 Squamous cell carcinoma of skin of other part of trunk: Secondary | ICD-10-CM | POA: Diagnosis not present

## 2022-12-09 DIAGNOSIS — L821 Other seborrheic keratosis: Secondary | ICD-10-CM | POA: Diagnosis not present

## 2022-12-09 DIAGNOSIS — C44212 Basal cell carcinoma of skin of right ear and external auricular canal: Secondary | ICD-10-CM | POA: Diagnosis not present

## 2022-12-09 DIAGNOSIS — Z85820 Personal history of malignant melanoma of skin: Secondary | ICD-10-CM | POA: Diagnosis not present

## 2022-12-18 DIAGNOSIS — D46Z Other myelodysplastic syndromes: Secondary | ICD-10-CM | POA: Diagnosis not present

## 2022-12-18 DIAGNOSIS — F0390 Unspecified dementia without behavioral disturbance: Secondary | ICD-10-CM | POA: Diagnosis not present

## 2022-12-18 DIAGNOSIS — I2510 Atherosclerotic heart disease of native coronary artery without angina pectoris: Secondary | ICD-10-CM | POA: Diagnosis not present

## 2022-12-18 DIAGNOSIS — N189 Chronic kidney disease, unspecified: Secondary | ICD-10-CM | POA: Diagnosis not present

## 2022-12-18 DIAGNOSIS — G609 Hereditary and idiopathic neuropathy, unspecified: Secondary | ICD-10-CM | POA: Diagnosis not present

## 2022-12-18 DIAGNOSIS — E538 Deficiency of other specified B group vitamins: Secondary | ICD-10-CM | POA: Diagnosis not present

## 2022-12-18 DIAGNOSIS — E785 Hyperlipidemia, unspecified: Secondary | ICD-10-CM | POA: Diagnosis not present

## 2022-12-31 DIAGNOSIS — E119 Type 2 diabetes mellitus without complications: Secondary | ICD-10-CM | POA: Diagnosis not present

## 2022-12-31 DIAGNOSIS — E782 Mixed hyperlipidemia: Secondary | ICD-10-CM | POA: Diagnosis not present

## 2022-12-31 DIAGNOSIS — D518 Other vitamin B12 deficiency anemias: Secondary | ICD-10-CM | POA: Diagnosis not present

## 2022-12-31 DIAGNOSIS — N189 Chronic kidney disease, unspecified: Secondary | ICD-10-CM | POA: Diagnosis not present

## 2022-12-31 DIAGNOSIS — I10 Essential (primary) hypertension: Secondary | ICD-10-CM | POA: Diagnosis not present

## 2022-12-31 DIAGNOSIS — E038 Other specified hypothyroidism: Secondary | ICD-10-CM | POA: Diagnosis not present

## 2022-12-31 DIAGNOSIS — I70223 Atherosclerosis of native arteries of extremities with rest pain, bilateral legs: Secondary | ICD-10-CM | POA: Diagnosis not present

## 2022-12-31 DIAGNOSIS — I2510 Atherosclerotic heart disease of native coronary artery without angina pectoris: Secondary | ICD-10-CM | POA: Diagnosis not present

## 2023-01-15 DIAGNOSIS — D46Z Other myelodysplastic syndromes: Secondary | ICD-10-CM | POA: Diagnosis not present

## 2023-01-15 DIAGNOSIS — I10 Essential (primary) hypertension: Secondary | ICD-10-CM | POA: Diagnosis not present

## 2023-01-15 DIAGNOSIS — E538 Deficiency of other specified B group vitamins: Secondary | ICD-10-CM | POA: Diagnosis not present

## 2023-01-15 DIAGNOSIS — C44519 Basal cell carcinoma of skin of other part of trunk: Secondary | ICD-10-CM | POA: Diagnosis not present

## 2023-01-15 DIAGNOSIS — M1990 Unspecified osteoarthritis, unspecified site: Secondary | ICD-10-CM | POA: Diagnosis not present

## 2023-01-15 DIAGNOSIS — G609 Hereditary and idiopathic neuropathy, unspecified: Secondary | ICD-10-CM | POA: Diagnosis not present

## 2023-01-15 DIAGNOSIS — N189 Chronic kidney disease, unspecified: Secondary | ICD-10-CM | POA: Diagnosis not present

## 2023-01-15 DIAGNOSIS — I2510 Atherosclerotic heart disease of native coronary artery without angina pectoris: Secondary | ICD-10-CM | POA: Diagnosis not present

## 2023-01-15 DIAGNOSIS — F0390 Unspecified dementia without behavioral disturbance: Secondary | ICD-10-CM | POA: Diagnosis not present

## 2023-01-15 DIAGNOSIS — E785 Hyperlipidemia, unspecified: Secondary | ICD-10-CM | POA: Diagnosis not present

## 2023-01-18 DIAGNOSIS — D485 Neoplasm of uncertain behavior of skin: Secondary | ICD-10-CM | POA: Diagnosis not present

## 2023-01-18 DIAGNOSIS — L720 Epidermal cyst: Secondary | ICD-10-CM | POA: Diagnosis not present

## 2023-01-18 DIAGNOSIS — C44529 Squamous cell carcinoma of skin of other part of trunk: Secondary | ICD-10-CM | POA: Diagnosis not present

## 2023-01-23 DIAGNOSIS — I10 Essential (primary) hypertension: Secondary | ICD-10-CM | POA: Diagnosis not present

## 2023-01-26 DIAGNOSIS — K591 Functional diarrhea: Secondary | ICD-10-CM | POA: Diagnosis not present

## 2023-01-30 DIAGNOSIS — A0472 Enterocolitis due to Clostridium difficile, not specified as recurrent: Secondary | ICD-10-CM | POA: Diagnosis not present

## 2023-02-02 DIAGNOSIS — I70223 Atherosclerosis of native arteries of extremities with rest pain, bilateral legs: Secondary | ICD-10-CM | POA: Diagnosis not present

## 2023-02-02 DIAGNOSIS — E782 Mixed hyperlipidemia: Secondary | ICD-10-CM | POA: Diagnosis not present

## 2023-02-02 DIAGNOSIS — E038 Other specified hypothyroidism: Secondary | ICD-10-CM | POA: Diagnosis not present

## 2023-02-02 DIAGNOSIS — N189 Chronic kidney disease, unspecified: Secondary | ICD-10-CM | POA: Diagnosis not present

## 2023-02-02 DIAGNOSIS — I2510 Atherosclerotic heart disease of native coronary artery without angina pectoris: Secondary | ICD-10-CM | POA: Diagnosis not present

## 2023-02-02 DIAGNOSIS — E119 Type 2 diabetes mellitus without complications: Secondary | ICD-10-CM | POA: Diagnosis not present

## 2023-02-02 DIAGNOSIS — I10 Essential (primary) hypertension: Secondary | ICD-10-CM | POA: Diagnosis not present

## 2023-02-02 DIAGNOSIS — D518 Other vitamin B12 deficiency anemias: Secondary | ICD-10-CM | POA: Diagnosis not present

## 2023-02-09 DIAGNOSIS — E782 Mixed hyperlipidemia: Secondary | ICD-10-CM | POA: Diagnosis not present

## 2023-02-09 DIAGNOSIS — E038 Other specified hypothyroidism: Secondary | ICD-10-CM | POA: Diagnosis not present

## 2023-02-09 DIAGNOSIS — E119 Type 2 diabetes mellitus without complications: Secondary | ICD-10-CM | POA: Diagnosis not present

## 2023-02-09 DIAGNOSIS — Z79899 Other long term (current) drug therapy: Secondary | ICD-10-CM | POA: Diagnosis not present

## 2023-02-09 DIAGNOSIS — D518 Other vitamin B12 deficiency anemias: Secondary | ICD-10-CM | POA: Diagnosis not present

## 2023-02-10 DIAGNOSIS — C44212 Basal cell carcinoma of skin of right ear and external auricular canal: Secondary | ICD-10-CM | POA: Diagnosis not present

## 2023-02-17 DIAGNOSIS — H3530 Unspecified macular degeneration: Secondary | ICD-10-CM | POA: Diagnosis not present

## 2023-02-17 DIAGNOSIS — H2703 Aphakia, bilateral: Secondary | ICD-10-CM | POA: Diagnosis not present

## 2023-02-23 DIAGNOSIS — I10 Essential (primary) hypertension: Secondary | ICD-10-CM | POA: Diagnosis not present

## 2023-02-26 DIAGNOSIS — I2510 Atherosclerotic heart disease of native coronary artery without angina pectoris: Secondary | ICD-10-CM | POA: Diagnosis not present

## 2023-02-26 DIAGNOSIS — E785 Hyperlipidemia, unspecified: Secondary | ICD-10-CM | POA: Diagnosis not present

## 2023-02-26 DIAGNOSIS — E538 Deficiency of other specified B group vitamins: Secondary | ICD-10-CM | POA: Diagnosis not present

## 2023-02-26 DIAGNOSIS — F0390 Unspecified dementia without behavioral disturbance: Secondary | ICD-10-CM | POA: Diagnosis not present

## 2023-02-26 DIAGNOSIS — N189 Chronic kidney disease, unspecified: Secondary | ICD-10-CM | POA: Diagnosis not present

## 2023-02-26 DIAGNOSIS — I10 Essential (primary) hypertension: Secondary | ICD-10-CM | POA: Diagnosis not present

## 2023-02-26 DIAGNOSIS — M1990 Unspecified osteoarthritis, unspecified site: Secondary | ICD-10-CM | POA: Diagnosis not present

## 2023-03-05 DIAGNOSIS — E038 Other specified hypothyroidism: Secondary | ICD-10-CM | POA: Diagnosis not present

## 2023-03-05 DIAGNOSIS — E119 Type 2 diabetes mellitus without complications: Secondary | ICD-10-CM | POA: Diagnosis not present

## 2023-03-05 DIAGNOSIS — D518 Other vitamin B12 deficiency anemias: Secondary | ICD-10-CM | POA: Diagnosis not present

## 2023-03-05 DIAGNOSIS — E782 Mixed hyperlipidemia: Secondary | ICD-10-CM | POA: Diagnosis not present

## 2023-03-05 DIAGNOSIS — I70223 Atherosclerosis of native arteries of extremities with rest pain, bilateral legs: Secondary | ICD-10-CM | POA: Diagnosis not present

## 2023-03-05 DIAGNOSIS — I2510 Atherosclerotic heart disease of native coronary artery without angina pectoris: Secondary | ICD-10-CM | POA: Diagnosis not present

## 2023-03-05 DIAGNOSIS — I10 Essential (primary) hypertension: Secondary | ICD-10-CM | POA: Diagnosis not present

## 2023-03-05 DIAGNOSIS — N189 Chronic kidney disease, unspecified: Secondary | ICD-10-CM | POA: Diagnosis not present

## 2023-03-17 DIAGNOSIS — F01A0 Vascular dementia, mild, without behavioral disturbance, psychotic disturbance, mood disturbance, and anxiety: Secondary | ICD-10-CM | POA: Diagnosis not present

## 2023-03-22 ENCOUNTER — Ambulatory Visit: Payer: Medicare Other | Admitting: Cardiology

## 2023-03-25 DIAGNOSIS — I10 Essential (primary) hypertension: Secondary | ICD-10-CM | POA: Diagnosis not present

## 2023-04-01 DIAGNOSIS — R197 Diarrhea, unspecified: Secondary | ICD-10-CM | POA: Diagnosis not present

## 2023-04-03 DIAGNOSIS — E038 Other specified hypothyroidism: Secondary | ICD-10-CM | POA: Diagnosis not present

## 2023-04-03 DIAGNOSIS — I70223 Atherosclerosis of native arteries of extremities with rest pain, bilateral legs: Secondary | ICD-10-CM | POA: Diagnosis not present

## 2023-04-03 DIAGNOSIS — E119 Type 2 diabetes mellitus without complications: Secondary | ICD-10-CM | POA: Diagnosis not present

## 2023-04-03 DIAGNOSIS — I10 Essential (primary) hypertension: Secondary | ICD-10-CM | POA: Diagnosis not present

## 2023-04-03 DIAGNOSIS — I2510 Atherosclerotic heart disease of native coronary artery without angina pectoris: Secondary | ICD-10-CM | POA: Diagnosis not present

## 2023-04-03 DIAGNOSIS — E782 Mixed hyperlipidemia: Secondary | ICD-10-CM | POA: Diagnosis not present

## 2023-04-03 DIAGNOSIS — D518 Other vitamin B12 deficiency anemias: Secondary | ICD-10-CM | POA: Diagnosis not present

## 2023-04-03 DIAGNOSIS — N189 Chronic kidney disease, unspecified: Secondary | ICD-10-CM | POA: Diagnosis not present

## 2023-04-08 DIAGNOSIS — A0472 Enterocolitis due to Clostridium difficile, not specified as recurrent: Secondary | ICD-10-CM | POA: Diagnosis not present

## 2023-04-09 DIAGNOSIS — E538 Deficiency of other specified B group vitamins: Secondary | ICD-10-CM | POA: Diagnosis not present

## 2023-04-09 DIAGNOSIS — G609 Hereditary and idiopathic neuropathy, unspecified: Secondary | ICD-10-CM | POA: Diagnosis not present

## 2023-04-09 DIAGNOSIS — M1990 Unspecified osteoarthritis, unspecified site: Secondary | ICD-10-CM | POA: Diagnosis not present

## 2023-04-09 DIAGNOSIS — D46Z Other myelodysplastic syndromes: Secondary | ICD-10-CM | POA: Diagnosis not present

## 2023-04-09 DIAGNOSIS — I10 Essential (primary) hypertension: Secondary | ICD-10-CM | POA: Diagnosis not present

## 2023-04-09 DIAGNOSIS — N189 Chronic kidney disease, unspecified: Secondary | ICD-10-CM | POA: Diagnosis not present

## 2023-04-09 DIAGNOSIS — F0390 Unspecified dementia without behavioral disturbance: Secondary | ICD-10-CM | POA: Diagnosis not present

## 2023-04-09 DIAGNOSIS — I2510 Atherosclerotic heart disease of native coronary artery without angina pectoris: Secondary | ICD-10-CM | POA: Diagnosis not present

## 2023-04-25 DIAGNOSIS — I10 Essential (primary) hypertension: Secondary | ICD-10-CM | POA: Diagnosis not present

## 2023-05-07 DIAGNOSIS — E538 Deficiency of other specified B group vitamins: Secondary | ICD-10-CM | POA: Diagnosis not present

## 2023-05-07 DIAGNOSIS — M1990 Unspecified osteoarthritis, unspecified site: Secondary | ICD-10-CM | POA: Diagnosis not present

## 2023-05-07 DIAGNOSIS — E785 Hyperlipidemia, unspecified: Secondary | ICD-10-CM | POA: Diagnosis not present

## 2023-05-07 DIAGNOSIS — N189 Chronic kidney disease, unspecified: Secondary | ICD-10-CM | POA: Diagnosis not present

## 2023-05-07 DIAGNOSIS — I10 Essential (primary) hypertension: Secondary | ICD-10-CM | POA: Diagnosis not present

## 2023-05-07 DIAGNOSIS — I2510 Atherosclerotic heart disease of native coronary artery without angina pectoris: Secondary | ICD-10-CM | POA: Diagnosis not present

## 2023-05-07 DIAGNOSIS — M25561 Pain in right knee: Secondary | ICD-10-CM | POA: Diagnosis not present

## 2023-05-07 DIAGNOSIS — D46Z Other myelodysplastic syndromes: Secondary | ICD-10-CM | POA: Diagnosis not present

## 2023-05-07 DIAGNOSIS — M549 Dorsalgia, unspecified: Secondary | ICD-10-CM | POA: Diagnosis not present

## 2023-05-10 DIAGNOSIS — I10 Essential (primary) hypertension: Secondary | ICD-10-CM | POA: Diagnosis not present

## 2023-05-10 DIAGNOSIS — I2510 Atherosclerotic heart disease of native coronary artery without angina pectoris: Secondary | ICD-10-CM | POA: Diagnosis not present

## 2023-05-10 DIAGNOSIS — E119 Type 2 diabetes mellitus without complications: Secondary | ICD-10-CM | POA: Diagnosis not present

## 2023-05-10 DIAGNOSIS — E782 Mixed hyperlipidemia: Secondary | ICD-10-CM | POA: Diagnosis not present

## 2023-05-10 DIAGNOSIS — E559 Vitamin D deficiency, unspecified: Secondary | ICD-10-CM | POA: Diagnosis not present

## 2023-05-10 DIAGNOSIS — I70223 Atherosclerosis of native arteries of extremities with rest pain, bilateral legs: Secondary | ICD-10-CM | POA: Diagnosis not present

## 2023-05-10 DIAGNOSIS — D518 Other vitamin B12 deficiency anemias: Secondary | ICD-10-CM | POA: Diagnosis not present

## 2023-05-10 DIAGNOSIS — N189 Chronic kidney disease, unspecified: Secondary | ICD-10-CM | POA: Diagnosis not present

## 2023-05-10 DIAGNOSIS — E038 Other specified hypothyroidism: Secondary | ICD-10-CM | POA: Diagnosis not present

## 2023-05-18 DIAGNOSIS — D518 Other vitamin B12 deficiency anemias: Secondary | ICD-10-CM | POA: Diagnosis not present

## 2023-05-18 DIAGNOSIS — L570 Actinic keratosis: Secondary | ICD-10-CM | POA: Diagnosis not present

## 2023-05-18 DIAGNOSIS — E559 Vitamin D deficiency, unspecified: Secondary | ICD-10-CM | POA: Diagnosis not present

## 2023-05-18 DIAGNOSIS — L820 Inflamed seborrheic keratosis: Secondary | ICD-10-CM | POA: Diagnosis not present

## 2023-05-18 DIAGNOSIS — E782 Mixed hyperlipidemia: Secondary | ICD-10-CM | POA: Diagnosis not present

## 2023-05-18 DIAGNOSIS — E038 Other specified hypothyroidism: Secondary | ICD-10-CM | POA: Diagnosis not present

## 2023-05-18 DIAGNOSIS — E119 Type 2 diabetes mellitus without complications: Secondary | ICD-10-CM | POA: Diagnosis not present

## 2023-05-18 DIAGNOSIS — F01A0 Vascular dementia, mild, without behavioral disturbance, psychotic disturbance, mood disturbance, and anxiety: Secondary | ICD-10-CM | POA: Diagnosis not present

## 2023-05-18 DIAGNOSIS — C44319 Basal cell carcinoma of skin of other parts of face: Secondary | ICD-10-CM | POA: Diagnosis not present

## 2023-05-18 DIAGNOSIS — Z79899 Other long term (current) drug therapy: Secondary | ICD-10-CM | POA: Diagnosis not present

## 2023-05-22 DIAGNOSIS — S01111A Laceration without foreign body of right eyelid and periocular area, initial encounter: Secondary | ICD-10-CM | POA: Diagnosis not present

## 2023-05-22 DIAGNOSIS — W19XXXA Unspecified fall, initial encounter: Secondary | ICD-10-CM | POA: Diagnosis not present

## 2023-05-22 DIAGNOSIS — F0390 Unspecified dementia without behavioral disturbance: Secondary | ICD-10-CM | POA: Diagnosis not present

## 2023-05-22 DIAGNOSIS — E876 Hypokalemia: Secondary | ICD-10-CM | POA: Diagnosis not present

## 2023-05-22 DIAGNOSIS — R58 Hemorrhage, not elsewhere classified: Secondary | ICD-10-CM | POA: Diagnosis not present

## 2023-05-22 DIAGNOSIS — I10 Essential (primary) hypertension: Secondary | ICD-10-CM | POA: Diagnosis not present

## 2023-05-22 DIAGNOSIS — R001 Bradycardia, unspecified: Secondary | ICD-10-CM | POA: Diagnosis not present

## 2023-05-22 DIAGNOSIS — I440 Atrioventricular block, first degree: Secondary | ICD-10-CM | POA: Diagnosis not present

## 2023-05-22 DIAGNOSIS — R9431 Abnormal electrocardiogram [ECG] [EKG]: Secondary | ICD-10-CM | POA: Diagnosis not present

## 2023-05-22 DIAGNOSIS — R410 Disorientation, unspecified: Secondary | ICD-10-CM | POA: Diagnosis not present

## 2023-05-22 DIAGNOSIS — R9089 Other abnormal findings on diagnostic imaging of central nervous system: Secondary | ICD-10-CM | POA: Diagnosis not present

## 2023-05-22 DIAGNOSIS — N184 Chronic kidney disease, stage 4 (severe): Secondary | ICD-10-CM | POA: Diagnosis not present

## 2023-05-23 DIAGNOSIS — Z743 Need for continuous supervision: Secondary | ICD-10-CM | POA: Diagnosis not present

## 2023-05-23 DIAGNOSIS — R531 Weakness: Secondary | ICD-10-CM | POA: Diagnosis not present

## 2023-05-26 DIAGNOSIS — I10 Essential (primary) hypertension: Secondary | ICD-10-CM | POA: Diagnosis not present

## 2023-05-28 DIAGNOSIS — S0191XA Laceration without foreign body of unspecified part of head, initial encounter: Secondary | ICD-10-CM | POA: Diagnosis not present

## 2023-05-28 DIAGNOSIS — R296 Repeated falls: Secondary | ICD-10-CM | POA: Diagnosis not present

## 2023-06-03 DIAGNOSIS — E038 Other specified hypothyroidism: Secondary | ICD-10-CM | POA: Diagnosis not present

## 2023-06-03 DIAGNOSIS — E782 Mixed hyperlipidemia: Secondary | ICD-10-CM | POA: Diagnosis not present

## 2023-06-03 DIAGNOSIS — I2510 Atherosclerotic heart disease of native coronary artery without angina pectoris: Secondary | ICD-10-CM | POA: Diagnosis not present

## 2023-06-03 DIAGNOSIS — N189 Chronic kidney disease, unspecified: Secondary | ICD-10-CM | POA: Diagnosis not present

## 2023-06-03 DIAGNOSIS — I70223 Atherosclerosis of native arteries of extremities with rest pain, bilateral legs: Secondary | ICD-10-CM | POA: Diagnosis not present

## 2023-06-03 DIAGNOSIS — D518 Other vitamin B12 deficiency anemias: Secondary | ICD-10-CM | POA: Diagnosis not present

## 2023-06-03 DIAGNOSIS — E119 Type 2 diabetes mellitus without complications: Secondary | ICD-10-CM | POA: Diagnosis not present

## 2023-06-03 DIAGNOSIS — I10 Essential (primary) hypertension: Secondary | ICD-10-CM | POA: Diagnosis not present

## 2023-06-03 DIAGNOSIS — E559 Vitamin D deficiency, unspecified: Secondary | ICD-10-CM | POA: Diagnosis not present

## 2023-06-09 DIAGNOSIS — Z23 Encounter for immunization: Secondary | ICD-10-CM | POA: Diagnosis not present

## 2023-06-11 DIAGNOSIS — M1990 Unspecified osteoarthritis, unspecified site: Secondary | ICD-10-CM | POA: Diagnosis not present

## 2023-06-11 DIAGNOSIS — I10 Essential (primary) hypertension: Secondary | ICD-10-CM | POA: Diagnosis not present

## 2023-06-11 DIAGNOSIS — I2510 Atherosclerotic heart disease of native coronary artery without angina pectoris: Secondary | ICD-10-CM | POA: Diagnosis not present

## 2023-06-11 DIAGNOSIS — F0390 Unspecified dementia without behavioral disturbance: Secondary | ICD-10-CM | POA: Diagnosis not present

## 2023-06-11 DIAGNOSIS — G609 Hereditary and idiopathic neuropathy, unspecified: Secondary | ICD-10-CM | POA: Diagnosis not present

## 2023-06-11 DIAGNOSIS — H1010 Acute atopic conjunctivitis, unspecified eye: Secondary | ICD-10-CM | POA: Diagnosis not present

## 2023-06-11 DIAGNOSIS — E538 Deficiency of other specified B group vitamins: Secondary | ICD-10-CM | POA: Diagnosis not present

## 2023-06-11 DIAGNOSIS — E785 Hyperlipidemia, unspecified: Secondary | ICD-10-CM | POA: Diagnosis not present

## 2023-06-11 DIAGNOSIS — D46Z Other myelodysplastic syndromes: Secondary | ICD-10-CM | POA: Diagnosis not present

## 2023-06-11 DIAGNOSIS — N189 Chronic kidney disease, unspecified: Secondary | ICD-10-CM | POA: Diagnosis not present

## 2023-06-16 DIAGNOSIS — C44319 Basal cell carcinoma of skin of other parts of face: Secondary | ICD-10-CM | POA: Diagnosis not present

## 2023-06-24 DIAGNOSIS — R197 Diarrhea, unspecified: Secondary | ICD-10-CM | POA: Diagnosis not present

## 2023-06-25 DIAGNOSIS — I10 Essential (primary) hypertension: Secondary | ICD-10-CM | POA: Diagnosis not present

## 2023-07-02 DIAGNOSIS — A0472 Enterocolitis due to Clostridium difficile, not specified as recurrent: Secondary | ICD-10-CM | POA: Diagnosis not present

## 2023-07-10 DIAGNOSIS — I10 Essential (primary) hypertension: Secondary | ICD-10-CM | POA: Diagnosis not present

## 2023-07-10 DIAGNOSIS — E119 Type 2 diabetes mellitus without complications: Secondary | ICD-10-CM | POA: Diagnosis not present

## 2023-07-10 DIAGNOSIS — I2510 Atherosclerotic heart disease of native coronary artery without angina pectoris: Secondary | ICD-10-CM | POA: Diagnosis not present

## 2023-07-10 DIAGNOSIS — E559 Vitamin D deficiency, unspecified: Secondary | ICD-10-CM | POA: Diagnosis not present

## 2023-07-10 DIAGNOSIS — N189 Chronic kidney disease, unspecified: Secondary | ICD-10-CM | POA: Diagnosis not present

## 2023-07-10 DIAGNOSIS — E782 Mixed hyperlipidemia: Secondary | ICD-10-CM | POA: Diagnosis not present

## 2023-07-10 DIAGNOSIS — I70223 Atherosclerosis of native arteries of extremities with rest pain, bilateral legs: Secondary | ICD-10-CM | POA: Diagnosis not present

## 2023-07-10 DIAGNOSIS — D518 Other vitamin B12 deficiency anemias: Secondary | ICD-10-CM | POA: Diagnosis not present

## 2023-07-10 DIAGNOSIS — E038 Other specified hypothyroidism: Secondary | ICD-10-CM | POA: Diagnosis not present

## 2023-07-15 DIAGNOSIS — I10 Essential (primary) hypertension: Secondary | ICD-10-CM | POA: Diagnosis not present

## 2023-07-15 DIAGNOSIS — N189 Chronic kidney disease, unspecified: Secondary | ICD-10-CM | POA: Diagnosis not present

## 2023-07-15 DIAGNOSIS — I2510 Atherosclerotic heart disease of native coronary artery without angina pectoris: Secondary | ICD-10-CM | POA: Diagnosis not present

## 2023-07-15 DIAGNOSIS — F0390 Unspecified dementia without behavioral disturbance: Secondary | ICD-10-CM | POA: Diagnosis not present

## 2023-07-25 DIAGNOSIS — S1181XA Laceration without foreign body of other specified part of neck, initial encounter: Secondary | ICD-10-CM | POA: Diagnosis not present

## 2023-07-25 DIAGNOSIS — S1191XA Laceration without foreign body of unspecified part of neck, initial encounter: Secondary | ICD-10-CM | POA: Diagnosis not present

## 2023-07-25 DIAGNOSIS — R519 Headache, unspecified: Secondary | ICD-10-CM | POA: Diagnosis not present

## 2023-07-25 DIAGNOSIS — M542 Cervicalgia: Secondary | ICD-10-CM | POA: Diagnosis not present

## 2023-07-25 DIAGNOSIS — R58 Hemorrhage, not elsewhere classified: Secondary | ICD-10-CM | POA: Diagnosis not present

## 2023-07-26 ENCOUNTER — Other Ambulatory Visit: Payer: Self-pay

## 2023-07-26 ENCOUNTER — Emergency Department (HOSPITAL_COMMUNITY)
Admission: EM | Admit: 2023-07-26 | Discharge: 2023-07-27 | Disposition: A | Discharge status: Home / Self Care | Payer: No Typology Code available for payment source | Attending: Student | Admitting: Student

## 2023-07-26 ENCOUNTER — Emergency Department (HOSPITAL_COMMUNITY): Payer: No Typology Code available for payment source

## 2023-07-26 DIAGNOSIS — I6782 Cerebral ischemia: Secondary | ICD-10-CM | POA: Diagnosis not present

## 2023-07-26 DIAGNOSIS — Z7902 Long term (current) use of antithrombotics/antiplatelets: Secondary | ICD-10-CM | POA: Insufficient documentation

## 2023-07-26 DIAGNOSIS — Z8546 Personal history of malignant neoplasm of prostate: Secondary | ICD-10-CM | POA: Insufficient documentation

## 2023-07-26 DIAGNOSIS — R410 Disorientation, unspecified: Secondary | ICD-10-CM | POA: Diagnosis not present

## 2023-07-26 DIAGNOSIS — S0990XA Unspecified injury of head, initial encounter: Secondary | ICD-10-CM | POA: Diagnosis not present

## 2023-07-26 DIAGNOSIS — Z85828 Personal history of other malignant neoplasm of skin: Secondary | ICD-10-CM | POA: Diagnosis not present

## 2023-07-26 DIAGNOSIS — I2510 Atherosclerotic heart disease of native coronary artery without angina pectoris: Secondary | ICD-10-CM | POA: Insufficient documentation

## 2023-07-26 DIAGNOSIS — Z79899 Other long term (current) drug therapy: Secondary | ICD-10-CM | POA: Diagnosis not present

## 2023-07-26 DIAGNOSIS — Z951 Presence of aortocoronary bypass graft: Secondary | ICD-10-CM | POA: Insufficient documentation

## 2023-07-26 DIAGNOSIS — S1181XA Laceration without foreign body of other specified part of neck, initial encounter: Secondary | ICD-10-CM | POA: Diagnosis not present

## 2023-07-26 DIAGNOSIS — Z96651 Presence of right artificial knee joint: Secondary | ICD-10-CM | POA: Diagnosis not present

## 2023-07-26 DIAGNOSIS — I10 Essential (primary) hypertension: Secondary | ICD-10-CM | POA: Diagnosis not present

## 2023-07-26 DIAGNOSIS — Y92410 Unspecified street and highway as the place of occurrence of the external cause: Secondary | ICD-10-CM | POA: Diagnosis not present

## 2023-07-26 DIAGNOSIS — R52 Pain, unspecified: Secondary | ICD-10-CM | POA: Diagnosis not present

## 2023-07-26 DIAGNOSIS — Z87891 Personal history of nicotine dependence: Secondary | ICD-10-CM | POA: Insufficient documentation

## 2023-07-26 DIAGNOSIS — M549 Dorsalgia, unspecified: Secondary | ICD-10-CM | POA: Diagnosis not present

## 2023-07-26 DIAGNOSIS — R4182 Altered mental status, unspecified: Secondary | ICD-10-CM | POA: Diagnosis not present

## 2023-07-26 DIAGNOSIS — I672 Cerebral atherosclerosis: Secondary | ICD-10-CM | POA: Diagnosis not present

## 2023-07-26 DIAGNOSIS — R0902 Hypoxemia: Secondary | ICD-10-CM | POA: Diagnosis not present

## 2023-07-26 DIAGNOSIS — M62838 Other muscle spasm: Secondary | ICD-10-CM | POA: Diagnosis not present

## 2023-07-26 LAB — CBG MONITORING, ED: Glucose-Capillary: 119 mg/dL — ABNORMAL HIGH (ref 70–99)

## 2023-07-26 LAB — COMPREHENSIVE METABOLIC PANEL
ALT: 23 U/L (ref 0–44)
AST: 22 U/L (ref 15–41)
Albumin: 3.6 g/dL (ref 3.5–5.0)
Alkaline Phosphatase: 102 U/L (ref 38–126)
Anion gap: 7 (ref 5–15)
BUN: 23 mg/dL (ref 8–23)
CO2: 19 mmol/L — ABNORMAL LOW (ref 22–32)
Calcium: 9.1 mg/dL (ref 8.9–10.3)
Chloride: 113 mmol/L — ABNORMAL HIGH (ref 98–111)
Creatinine, Ser: 2.21 mg/dL — ABNORMAL HIGH (ref 0.61–1.24)
GFR, Estimated: 29 mL/min — ABNORMAL LOW (ref >60)
Glucose, Bld: 123 mg/dL — ABNORMAL HIGH (ref 70–99)
Potassium: 4.1 mmol/L (ref 3.5–5.1)
Sodium: 139 mmol/L (ref 135–145)
Total Bilirubin: 0.8 mg/dL (ref <1.2)
Total Protein: 7.1 g/dL (ref 6.5–8.1)

## 2023-07-26 LAB — CBC
HCT: 34.7 % — ABNORMAL LOW (ref 39.0–52.0)
Hemoglobin: 10.9 g/dL — ABNORMAL LOW (ref 13.0–17.0)
MCH: 30 pg (ref 26.0–34.0)
MCHC: 31.4 g/dL (ref 30.0–36.0)
MCV: 95.6 fL (ref 80.0–100.0)
Platelets: 225 10*3/uL (ref 150–400)
RBC: 3.63 MIL/uL — ABNORMAL LOW (ref 4.22–5.81)
RDW: 12.9 % (ref 11.5–15.5)
WBC: 7.5 10*3/uL (ref 4.0–10.5)
nRBC: 0 % (ref 0.0–0.2)
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
## 2023-07-26 NOTE — ED Notes (Signed)
Attempted urine sample with no success. Pt was given apple juice to drink at this time.

## 2023-07-27 DIAGNOSIS — I10 Essential (primary) hypertension: Secondary | ICD-10-CM | POA: Diagnosis not present

## 2023-07-28 DIAGNOSIS — R52 Pain, unspecified: Secondary | ICD-10-CM | POA: Diagnosis not present

## 2023-07-31 ENCOUNTER — Encounter (HOSPITAL_COMMUNITY): Payer: Self-pay | Admitting: Emergency Medicine

## 2023-07-31 ENCOUNTER — Emergency Department (HOSPITAL_COMMUNITY)
Admission: EM | Admit: 2023-07-31 | Discharge: 2023-08-01 | Disposition: A | Discharge status: Home / Self Care | Payer: Medicare Other | Attending: Emergency Medicine | Admitting: Emergency Medicine

## 2023-07-31 ENCOUNTER — Other Ambulatory Visit: Payer: Self-pay

## 2023-07-31 DIAGNOSIS — R4182 Altered mental status, unspecified: Secondary | ICD-10-CM | POA: Insufficient documentation

## 2023-07-31 DIAGNOSIS — Z7982 Long term (current) use of aspirin: Secondary | ICD-10-CM | POA: Diagnosis not present

## 2023-07-31 DIAGNOSIS — R9389 Abnormal findings on diagnostic imaging of other specified body structures: Secondary | ICD-10-CM | POA: Diagnosis not present

## 2023-07-31 DIAGNOSIS — Q211 Atrial septal defect, unspecified: Secondary | ICD-10-CM | POA: Diagnosis not present

## 2023-07-31 DIAGNOSIS — Z041 Encounter for examination and observation following transport accident: Secondary | ICD-10-CM | POA: Diagnosis not present

## 2023-07-31 DIAGNOSIS — R404 Transient alteration of awareness: Secondary | ICD-10-CM | POA: Diagnosis not present

## 2023-07-31 DIAGNOSIS — F0390 Unspecified dementia without behavioral disturbance: Secondary | ICD-10-CM | POA: Diagnosis not present

## 2023-07-31 DIAGNOSIS — Z7902 Long term (current) use of antithrombotics/antiplatelets: Secondary | ICD-10-CM | POA: Diagnosis not present

## 2023-07-31 DIAGNOSIS — F69 Unspecified disorder of adult personality and behavior: Secondary | ICD-10-CM | POA: Diagnosis not present

## 2023-07-31 DIAGNOSIS — E860 Dehydration: Secondary | ICD-10-CM | POA: Insufficient documentation
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
## 2023-08-01 ENCOUNTER — Other Ambulatory Visit (HOSPITAL_COMMUNITY): Payer: Medicare Other

## 2023-08-01 DIAGNOSIS — Z041 Encounter for examination and observation following transport accident: Secondary | ICD-10-CM | POA: Diagnosis not present

## 2023-08-01 DIAGNOSIS — R404 Transient alteration of awareness: Secondary | ICD-10-CM | POA: Diagnosis not present

## 2023-08-01 DIAGNOSIS — Z743 Need for continuous supervision: Secondary | ICD-10-CM | POA: Diagnosis not present

## 2023-08-01 DIAGNOSIS — Q211 Atrial septal defect, unspecified: Secondary | ICD-10-CM | POA: Diagnosis not present

## 2023-08-01 DIAGNOSIS — R4182 Altered mental status, unspecified: Secondary | ICD-10-CM | POA: Diagnosis not present

## 2023-08-01 DIAGNOSIS — R9389 Abnormal findings on diagnostic imaging of other specified body structures: Secondary | ICD-10-CM | POA: Diagnosis not present

## 2023-08-01 LAB — COMPREHENSIVE METABOLIC PANEL
ALT: 25 U/L (ref 0–44)
AST: 30 U/L (ref 15–41)
Albumin: 3.3 g/dL — ABNORMAL LOW (ref 3.5–5.0)
Alkaline Phosphatase: 127 U/L — ABNORMAL HIGH (ref 38–126)
Anion gap: 10 (ref 5–15)
BUN: 48 mg/dL — ABNORMAL HIGH (ref 8–23)
CO2: 19 mmol/L — ABNORMAL LOW (ref 22–32)
Calcium: 9.2 mg/dL (ref 8.9–10.3)
Chloride: 114 mmol/L — ABNORMAL HIGH (ref 98–111)
Creatinine, Ser: 2.42 mg/dL — ABNORMAL HIGH (ref 0.61–1.24)
GFR, Estimated: 26 mL/min — ABNORMAL LOW (ref >60)
Glucose, Bld: 109 mg/dL — ABNORMAL HIGH (ref 70–99)
Potassium: 3.7 mmol/L (ref 3.5–5.1)
Sodium: 143 mmol/L (ref 135–145)
Total Bilirubin: 0.9 mg/dL (ref <1.2)
Total Protein: 7 g/dL (ref 6.5–8.1)

## 2023-08-01 LAB — CBC WITH DIFFERENTIAL/PLATELET
Abs Immature Granulocytes: 0.02 10*3/uL (ref 0.00–0.07)
Basophils Absolute: 0 10*3/uL (ref 0.0–0.1)
Basophils Relative: 0 %
Eosinophils Absolute: 0.2 10*3/uL (ref 0.0–0.5)
Eosinophils Relative: 2 %
HCT: 37.1 % — ABNORMAL LOW (ref 39.0–52.0)
Hemoglobin: 12 g/dL — ABNORMAL LOW (ref 13.0–17.0)
Immature Granulocytes: 0 %
Lymphocytes Relative: 19 %
Lymphs Abs: 1.8 10*3/uL (ref 0.7–4.0)
MCH: 31.3 pg (ref 26.0–34.0)
MCHC: 32.3 g/dL (ref 30.0–36.0)
MCV: 96.6 fL (ref 80.0–100.0)
Monocytes Absolute: 1 10*3/uL (ref 0.1–1.0)
Monocytes Relative: 11 %
Neutro Abs: 6.3 10*3/uL (ref 1.7–7.7)
Neutrophils Relative %: 68 %
Platelets: 294 10*3/uL (ref 150–400)
RBC: 3.84 MIL/uL — ABNORMAL LOW (ref 4.22–5.81)
RDW: 12.9 % (ref 11.5–15.5)
WBC: 9.4 10*3/uL (ref 4.0–10.5)
nRBC: 0 % (ref 0.0–0.2)

## 2023-08-01 LAB — URINALYSIS, W/ REFLEX TO CULTURE (INFECTION SUSPECTED)
Bacteria, UA: NONE SEEN
Bilirubin Urine: NEGATIVE
Glucose, UA: NEGATIVE mg/dL
Ketones, ur: 5 mg/dL — AB
Leukocytes,Ua: NEGATIVE
Nitrite: NEGATIVE
Protein, ur: 100 mg/dL — AB
Specific Gravity, Urine: 1.016 (ref 1.005–1.030)
pH: 5 (ref 5.0–8.0)

## 2023-08-01 LAB — RAPID URINE DRUG SCREEN, HOSP PERFORMED
Amphetamines: NOT DETECTED
Barbiturates: NOT DETECTED
Benzodiazepines: NOT DETECTED
Cocaine: NOT DETECTED
Opiates: NOT DETECTED
Tetrahydrocannabinol: NOT DETECTED

## 2023-08-01 LAB — MAGNESIUM: Magnesium: 2.7 mg/dL — ABNORMAL HIGH (ref 1.7–2.4)

## 2023-08-01 LAB — TROPONIN I (HIGH SENSITIVITY): Troponin I (High Sensitivity): 13 ng/L (ref <18)

## 2023-08-01 LAB — AMMONIA: Ammonia: <10 umol/L (ref 9–35)

## 2023-08-01 LAB — ETHANOL: Alcohol, Ethyl (B): <10 mg/dL (ref <10)

## 2023-08-01 MED ORDER — SODIUM CHLORIDE 0.9 % IV BOLUS
500.0000 mL | Freq: Once | INTRAVENOUS | Status: AC
  Administered 2023-08-01: 500 mL via INTRAVENOUS
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
## 2023-08-01 NOTE — ED Notes (Signed)
Pt moved to room 43 to await PTAR.  Pt resting quietly on ER stretcher, NAD noted, no needs or concerns voiced at this time.
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
## 2023-08-02 DIAGNOSIS — N189 Chronic kidney disease, unspecified: Secondary | ICD-10-CM | POA: Diagnosis not present

## 2023-08-02 DIAGNOSIS — I70223 Atherosclerosis of native arteries of extremities with rest pain, bilateral legs: Secondary | ICD-10-CM | POA: Diagnosis not present

## 2023-08-02 DIAGNOSIS — I2510 Atherosclerotic heart disease of native coronary artery without angina pectoris: Secondary | ICD-10-CM | POA: Diagnosis not present

## 2023-08-02 DIAGNOSIS — I10 Essential (primary) hypertension: Secondary | ICD-10-CM | POA: Diagnosis not present

## 2023-08-03 DIAGNOSIS — E038 Other specified hypothyroidism: Secondary | ICD-10-CM | POA: Diagnosis not present

## 2023-08-03 DIAGNOSIS — E782 Mixed hyperlipidemia: Secondary | ICD-10-CM | POA: Diagnosis not present

## 2023-08-03 DIAGNOSIS — D519 Vitamin B12 deficiency anemia, unspecified: Secondary | ICD-10-CM | POA: Diagnosis not present

## 2023-08-03 DIAGNOSIS — E119 Type 2 diabetes mellitus without complications: Secondary | ICD-10-CM | POA: Diagnosis not present

## 2023-08-03 DIAGNOSIS — Z79899 Other long term (current) drug therapy: Secondary | ICD-10-CM | POA: Diagnosis not present

## 2023-08-04 DIAGNOSIS — Z96651 Presence of right artificial knee joint: Secondary | ICD-10-CM | POA: Diagnosis not present

## 2023-08-04 DIAGNOSIS — G629 Polyneuropathy, unspecified: Secondary | ICD-10-CM | POA: Diagnosis not present

## 2023-08-04 DIAGNOSIS — G8190 Hemiplegia, unspecified affecting unspecified side: Secondary | ICD-10-CM | POA: Diagnosis not present

## 2023-08-04 DIAGNOSIS — F0390 Unspecified dementia without behavioral disturbance: Secondary | ICD-10-CM | POA: Diagnosis not present

## 2023-08-04 DIAGNOSIS — I129 Hypertensive chronic kidney disease with stage 1 through stage 4 chronic kidney disease, or unspecified chronic kidney disease: Secondary | ICD-10-CM | POA: Diagnosis not present

## 2023-08-04 DIAGNOSIS — Z87891 Personal history of nicotine dependence: Secondary | ICD-10-CM | POA: Diagnosis not present

## 2023-08-04 DIAGNOSIS — Z7902 Long term (current) use of antithrombotics/antiplatelets: Secondary | ICD-10-CM | POA: Diagnosis not present

## 2023-08-04 DIAGNOSIS — I252 Old myocardial infarction: Secondary | ICD-10-CM | POA: Diagnosis not present

## 2023-08-04 DIAGNOSIS — E785 Hyperlipidemia, unspecified: Secondary | ICD-10-CM | POA: Diagnosis not present

## 2023-08-04 DIAGNOSIS — I2510 Atherosclerotic heart disease of native coronary artery without angina pectoris: Secondary | ICD-10-CM | POA: Diagnosis not present

## 2023-08-04 DIAGNOSIS — E538 Deficiency of other specified B group vitamins: Secondary | ICD-10-CM | POA: Diagnosis not present

## 2023-08-04 DIAGNOSIS — Z9181 History of falling: Secondary | ICD-10-CM | POA: Diagnosis not present

## 2023-08-04 DIAGNOSIS — D46Z Other myelodysplastic syndromes: Secondary | ICD-10-CM | POA: Diagnosis not present

## 2023-08-04 DIAGNOSIS — F01A0 Vascular dementia, mild, without behavioral disturbance, psychotic disturbance, mood disturbance, and anxiety: Secondary | ICD-10-CM | POA: Diagnosis not present

## 2023-08-04 DIAGNOSIS — N189 Chronic kidney disease, unspecified: Secondary | ICD-10-CM | POA: Diagnosis not present

## 2023-08-04 DIAGNOSIS — M1990 Unspecified osteoarthritis, unspecified site: Secondary | ICD-10-CM | POA: Diagnosis not present

## 2023-08-04 DIAGNOSIS — Z8673 Personal history of transient ischemic attack (TIA), and cerebral infarction without residual deficits: Secondary | ICD-10-CM | POA: Diagnosis not present

## 2023-08-05 DIAGNOSIS — R52 Pain, unspecified: Secondary | ICD-10-CM | POA: Diagnosis not present

## 2023-08-05 DIAGNOSIS — E8809 Other disorders of plasma-protein metabolism, not elsewhere classified: Secondary | ICD-10-CM | POA: Diagnosis not present

## 2023-08-05 DIAGNOSIS — E876 Hypokalemia: Secondary | ICD-10-CM | POA: Diagnosis not present

## 2023-08-05 DIAGNOSIS — N184 Chronic kidney disease, stage 4 (severe): Secondary | ICD-10-CM | POA: Diagnosis not present

## 2023-08-05 DIAGNOSIS — N189 Chronic kidney disease, unspecified: Secondary | ICD-10-CM | POA: Diagnosis not present

## 2023-08-10 DIAGNOSIS — R339 Retention of urine, unspecified: Secondary | ICD-10-CM | POA: Diagnosis not present

## 2023-08-10 DIAGNOSIS — D46Z Other myelodysplastic syndromes: Secondary | ICD-10-CM | POA: Diagnosis not present

## 2023-08-10 DIAGNOSIS — G8190 Hemiplegia, unspecified affecting unspecified side: Secondary | ICD-10-CM | POA: Diagnosis not present

## 2023-08-10 DIAGNOSIS — G629 Polyneuropathy, unspecified: Secondary | ICD-10-CM | POA: Diagnosis not present

## 2023-08-10 DIAGNOSIS — I129 Hypertensive chronic kidney disease with stage 1 through stage 4 chronic kidney disease, or unspecified chronic kidney disease: Secondary | ICD-10-CM | POA: Diagnosis not present

## 2023-08-10 DIAGNOSIS — F0390 Unspecified dementia without behavioral disturbance: Secondary | ICD-10-CM | POA: Diagnosis not present

## 2023-08-10 DIAGNOSIS — N189 Chronic kidney disease, unspecified: Secondary | ICD-10-CM | POA: Diagnosis not present

## 2023-08-11 DIAGNOSIS — N189 Chronic kidney disease, unspecified: Secondary | ICD-10-CM | POA: Diagnosis not present

## 2023-08-11 DIAGNOSIS — I129 Hypertensive chronic kidney disease with stage 1 through stage 4 chronic kidney disease, or unspecified chronic kidney disease: Secondary | ICD-10-CM | POA: Diagnosis not present

## 2023-08-11 DIAGNOSIS — D46Z Other myelodysplastic syndromes: Secondary | ICD-10-CM | POA: Diagnosis not present

## 2023-08-11 DIAGNOSIS — F0390 Unspecified dementia without behavioral disturbance: Secondary | ICD-10-CM | POA: Diagnosis not present

## 2023-08-11 DIAGNOSIS — G8190 Hemiplegia, unspecified affecting unspecified side: Secondary | ICD-10-CM | POA: Diagnosis not present

## 2023-08-11 DIAGNOSIS — G629 Polyneuropathy, unspecified: Secondary | ICD-10-CM | POA: Diagnosis not present

## 2023-08-12 DIAGNOSIS — N184 Chronic kidney disease, stage 4 (severe): Secondary | ICD-10-CM | POA: Diagnosis not present

## 2023-08-12 DIAGNOSIS — Z951 Presence of aortocoronary bypass graft: Secondary | ICD-10-CM | POA: Diagnosis not present

## 2023-08-12 DIAGNOSIS — I69359 Hemiplegia and hemiparesis following cerebral infarction affecting unspecified side: Secondary | ICD-10-CM | POA: Diagnosis not present

## 2023-08-12 DIAGNOSIS — Z515 Encounter for palliative care: Secondary | ICD-10-CM | POA: Diagnosis not present

## 2023-08-12 DIAGNOSIS — F0280 Dementia in other diseases classified elsewhere without behavioral disturbance: Secondary | ICD-10-CM | POA: Diagnosis not present

## 2023-08-12 DIAGNOSIS — R4189 Other symptoms and signs involving cognitive functions and awareness: Secondary | ICD-10-CM | POA: Diagnosis not present

## 2023-08-12 DIAGNOSIS — M1990 Unspecified osteoarthritis, unspecified site: Secondary | ICD-10-CM | POA: Diagnosis not present

## 2023-08-12 DIAGNOSIS — I10 Essential (primary) hypertension: Secondary | ICD-10-CM | POA: Diagnosis not present

## 2023-08-12 DIAGNOSIS — Z466 Encounter for fitting and adjustment of urinary device: Secondary | ICD-10-CM | POA: Diagnosis not present

## 2023-08-12 DIAGNOSIS — G311 Senile degeneration of brain, not elsewhere classified: Secondary | ICD-10-CM | POA: Diagnosis not present

## 2023-08-12 DIAGNOSIS — I252 Old myocardial infarction: Secondary | ICD-10-CM | POA: Diagnosis not present

## 2023-08-12 DIAGNOSIS — I2510 Atherosclerotic heart disease of native coronary artery without angina pectoris: Secondary | ICD-10-CM | POA: Diagnosis not present

## 2023-08-12 DIAGNOSIS — E785 Hyperlipidemia, unspecified: Secondary | ICD-10-CM | POA: Diagnosis not present

## 2023-08-12 DIAGNOSIS — G609 Hereditary and idiopathic neuropathy, unspecified: Secondary | ICD-10-CM | POA: Diagnosis not present

## 2023-08-12 DIAGNOSIS — I129 Hypertensive chronic kidney disease with stage 1 through stage 4 chronic kidney disease, or unspecified chronic kidney disease: Secondary | ICD-10-CM | POA: Diagnosis not present

## 2023-08-12 DIAGNOSIS — D46Z Other myelodysplastic syndromes: Secondary | ICD-10-CM | POA: Diagnosis not present

## 2023-08-12 DIAGNOSIS — Z741 Need for assistance with personal care: Secondary | ICD-10-CM | POA: Diagnosis not present

## 2023-08-12 DIAGNOSIS — E538 Deficiency of other specified B group vitamins: Secondary | ICD-10-CM | POA: Diagnosis not present

## 2023-08-13 ENCOUNTER — Inpatient Hospital Stay (HOSPITAL_COMMUNITY)
Admission: EM | Admit: 2023-08-13 | Discharge: 2023-08-23 | DRG: 698 | Disposition: A | Discharge status: Hospice | Payer: Medicare Other | Source: Skilled Nursing Facility | Attending: Internal Medicine | Admitting: Internal Medicine

## 2023-08-13 ENCOUNTER — Emergency Department (HOSPITAL_COMMUNITY): Payer: Medicare Other

## 2023-08-13 DIAGNOSIS — Y846 Urinary catheterization as the cause of abnormal reaction of the patient, or of later complication, without mention of misadventure at the time of the procedure: Secondary | ICD-10-CM | POA: Diagnosis present

## 2023-08-13 DIAGNOSIS — Z515 Encounter for palliative care: Secondary | ICD-10-CM

## 2023-08-13 DIAGNOSIS — Z96651 Presence of right artificial knee joint: Secondary | ICD-10-CM | POA: Diagnosis present

## 2023-08-13 DIAGNOSIS — E872 Acidosis, unspecified: Secondary | ICD-10-CM | POA: Diagnosis present

## 2023-08-13 DIAGNOSIS — L8990 Pressure ulcer of unspecified site, unspecified stage: Secondary | ICD-10-CM | POA: Insufficient documentation

## 2023-08-13 DIAGNOSIS — I2510 Atherosclerotic heart disease of native coronary artery without angina pectoris: Secondary | ICD-10-CM | POA: Diagnosis present

## 2023-08-13 DIAGNOSIS — N179 Acute kidney failure, unspecified: Secondary | ICD-10-CM | POA: Diagnosis present

## 2023-08-13 DIAGNOSIS — G9341 Metabolic encephalopathy: Secondary | ICD-10-CM | POA: Diagnosis not present

## 2023-08-13 DIAGNOSIS — T83511A Infection and inflammatory reaction due to indwelling urethral catheter, initial encounter: Secondary | ICD-10-CM | POA: Diagnosis not present

## 2023-08-13 DIAGNOSIS — B961 Klebsiella pneumoniae [K. pneumoniae] as the cause of diseases classified elsewhere: Secondary | ICD-10-CM | POA: Diagnosis present

## 2023-08-13 DIAGNOSIS — Z1619 Resistance to other specified beta lactam antibiotics: Secondary | ICD-10-CM | POA: Diagnosis present

## 2023-08-13 DIAGNOSIS — R918 Other nonspecific abnormal finding of lung field: Secondary | ICD-10-CM | POA: Diagnosis not present

## 2023-08-13 DIAGNOSIS — E860 Dehydration: Secondary | ICD-10-CM | POA: Diagnosis present

## 2023-08-13 DIAGNOSIS — I6782 Cerebral ischemia: Secondary | ICD-10-CM | POA: Diagnosis not present

## 2023-08-13 DIAGNOSIS — R000 Tachycardia, unspecified: Secondary | ICD-10-CM | POA: Diagnosis not present

## 2023-08-13 DIAGNOSIS — D631 Anemia in chronic kidney disease: Secondary | ICD-10-CM | POA: Diagnosis present

## 2023-08-13 DIAGNOSIS — Z823 Family history of stroke: Secondary | ICD-10-CM

## 2023-08-13 DIAGNOSIS — E785 Hyperlipidemia, unspecified: Secondary | ICD-10-CM | POA: Diagnosis present

## 2023-08-13 DIAGNOSIS — Z7902 Long term (current) use of antithrombotics/antiplatelets: Secondary | ICD-10-CM

## 2023-08-13 DIAGNOSIS — Z960 Presence of urogenital implants: Secondary | ICD-10-CM | POA: Diagnosis present

## 2023-08-13 DIAGNOSIS — Z79899 Other long term (current) drug therapy: Secondary | ICD-10-CM

## 2023-08-13 DIAGNOSIS — F0390 Unspecified dementia without behavioral disturbance: Secondary | ICD-10-CM

## 2023-08-13 DIAGNOSIS — E876 Hypokalemia: Secondary | ICD-10-CM | POA: Diagnosis not present

## 2023-08-13 DIAGNOSIS — R4182 Altered mental status, unspecified: Secondary | ICD-10-CM | POA: Diagnosis not present

## 2023-08-13 DIAGNOSIS — K5190 Ulcerative colitis, unspecified, without complications: Secondary | ICD-10-CM | POA: Diagnosis present

## 2023-08-13 DIAGNOSIS — E861 Hypovolemia: Secondary | ICD-10-CM | POA: Diagnosis present

## 2023-08-13 DIAGNOSIS — Z951 Presence of aortocoronary bypass graft: Secondary | ICD-10-CM | POA: Diagnosis not present

## 2023-08-13 DIAGNOSIS — Z66 Do not resuscitate: Secondary | ICD-10-CM | POA: Diagnosis not present

## 2023-08-13 DIAGNOSIS — N390 Urinary tract infection, site not specified: Principal | ICD-10-CM

## 2023-08-13 DIAGNOSIS — Q211 Atrial septal defect, unspecified: Secondary | ICD-10-CM | POA: Diagnosis not present

## 2023-08-13 DIAGNOSIS — I129 Hypertensive chronic kidney disease with stage 1 through stage 4 chronic kidney disease, or unspecified chronic kidney disease: Secondary | ICD-10-CM | POA: Diagnosis present

## 2023-08-13 DIAGNOSIS — N1832 Chronic kidney disease, stage 3b: Secondary | ICD-10-CM | POA: Diagnosis present

## 2023-08-13 DIAGNOSIS — F05 Delirium due to known physiological condition: Secondary | ICD-10-CM | POA: Diagnosis not present

## 2023-08-13 DIAGNOSIS — I700 Atherosclerosis of aorta: Secondary | ICD-10-CM | POA: Diagnosis not present

## 2023-08-13 DIAGNOSIS — Z8774 Personal history of (corrected) congenital malformations of heart and circulatory system: Secondary | ICD-10-CM

## 2023-08-13 DIAGNOSIS — E870 Hyperosmolality and hypernatremia: Secondary | ICD-10-CM

## 2023-08-13 DIAGNOSIS — D649 Anemia, unspecified: Secondary | ICD-10-CM | POA: Diagnosis not present

## 2023-08-13 DIAGNOSIS — Z87891 Personal history of nicotine dependence: Secondary | ICD-10-CM

## 2023-08-13 DIAGNOSIS — F0150 Vascular dementia without behavioral disturbance: Secondary | ICD-10-CM | POA: Diagnosis present

## 2023-08-13 DIAGNOSIS — G309 Alzheimer's disease, unspecified: Secondary | ICD-10-CM | POA: Diagnosis not present

## 2023-08-13 DIAGNOSIS — L89152 Pressure ulcer of sacral region, stage 2: Secondary | ICD-10-CM | POA: Diagnosis present

## 2023-08-13 DIAGNOSIS — F02C0 Dementia in other diseases classified elsewhere, severe, without behavioral disturbance, psychotic disturbance, mood disturbance, and anxiety: Secondary | ICD-10-CM | POA: Diagnosis not present

## 2023-08-13 DIAGNOSIS — Z7401 Bed confinement status: Secondary | ICD-10-CM | POA: Diagnosis not present

## 2023-08-13 HISTORY — DX: Metabolic encephalopathy: G93.41

## 2023-08-13 HISTORY — DX: Urinary tract infection, site not specified: N39.0

## 2023-08-13 LAB — CBC
HCT: 39.8 % (ref 39.0–52.0)
Hemoglobin: 12.6 g/dL — ABNORMAL LOW (ref 13.0–17.0)
MCH: 30.4 pg (ref 26.0–34.0)
MCHC: 31.7 g/dL (ref 30.0–36.0)
MCV: 95.9 fL (ref 80.0–100.0)
Platelets: 344 10*3/uL (ref 150–400)
RBC: 4.15 MIL/uL — ABNORMAL LOW (ref 4.22–5.81)
RDW: 13 % (ref 11.5–15.5)
WBC: 10.4 10*3/uL (ref 4.0–10.5)
nRBC: 0 % (ref 0.0–0.2)

## 2023-08-13 LAB — COMPREHENSIVE METABOLIC PANEL
ALT: 37 U/L (ref 0–44)
AST: 30 U/L (ref 15–41)
Albumin: 2.8 g/dL — ABNORMAL LOW (ref 3.5–5.0)
Alkaline Phosphatase: 228 U/L — ABNORMAL HIGH (ref 38–126)
Anion gap: 9 (ref 5–15)
BUN: 42 mg/dL — ABNORMAL HIGH (ref 8–23)
CO2: 19 mmol/L — ABNORMAL LOW (ref 22–32)
Calcium: 9.4 mg/dL (ref 8.9–10.3)
Chloride: 119 mmol/L — ABNORMAL HIGH (ref 98–111)
Creatinine, Ser: 2.76 mg/dL — ABNORMAL HIGH (ref 0.61–1.24)
GFR, Estimated: 22 mL/min — ABNORMAL LOW (ref >60)
Glucose, Bld: 104 mg/dL — ABNORMAL HIGH (ref 70–99)
Potassium: 4 mmol/L (ref 3.5–5.1)
Sodium: 147 mmol/L — ABNORMAL HIGH (ref 135–145)
Total Bilirubin: 0.8 mg/dL (ref <1.2)
Total Protein: 6.6 g/dL (ref 6.5–8.1)

## 2023-08-13 LAB — BASIC METABOLIC PANEL
Anion gap: 18 — ABNORMAL HIGH (ref 5–15)
Anion gap: 12 (ref 5–15)
BUN: 45 mg/dL — ABNORMAL HIGH (ref 8–23)
BUN: 43 mg/dL — ABNORMAL HIGH (ref 8–23)
CO2: 18 mmol/L — ABNORMAL LOW (ref 22–32)
CO2: 19 mmol/L — ABNORMAL LOW (ref 22–32)
Calcium: 9.3 mg/dL (ref 8.9–10.3)
Calcium: 8.8 mg/dL — ABNORMAL LOW (ref 8.9–10.3)
Chloride: 116 mmol/L — ABNORMAL HIGH (ref 98–111)
Chloride: 117 mmol/L — ABNORMAL HIGH (ref 98–111)
Creatinine, Ser: 2.92 mg/dL — ABNORMAL HIGH (ref 0.61–1.24)
Creatinine, Ser: 2.8 mg/dL — ABNORMAL HIGH (ref 0.61–1.24)
GFR, Estimated: 21 mL/min — ABNORMAL LOW (ref >60)
GFR, Estimated: 22 mL/min — ABNORMAL LOW (ref >60)
Glucose, Bld: 100 mg/dL — ABNORMAL HIGH (ref 70–99)
Glucose, Bld: 99 mg/dL (ref 70–99)
Potassium: 4.7 mmol/L (ref 3.5–5.1)
Potassium: 4 mmol/L (ref 3.5–5.1)
Sodium: 152 mmol/L — ABNORMAL HIGH (ref 135–145)
Sodium: 148 mmol/L — ABNORMAL HIGH (ref 135–145)

## 2023-08-13 LAB — TSH: TSH: 3.46 u[IU]/mL (ref 0.350–4.500)

## 2023-08-13 LAB — URINALYSIS, W/ REFLEX TO CULTURE (INFECTION SUSPECTED)
Bilirubin Urine: NEGATIVE
Glucose, UA: NEGATIVE mg/dL
Ketones, ur: 5 mg/dL — AB
Nitrite: POSITIVE — AB
Protein, ur: 100 mg/dL — AB
Specific Gravity, Urine: 1.015 (ref 1.005–1.030)
WBC, UA: >50 WBC/hpf (ref 0–5)
pH: 5 (ref 5.0–8.0)

## 2023-08-13 LAB — HIV ANTIBODY (ROUTINE TESTING W REFLEX): HIV Screen 4th Generation wRfx: NONREACTIVE

## 2023-08-13 LAB — RESP PANEL BY RT-PCR (RSV, FLU A&B, COVID)  RVPGX2
Influenza A by PCR: NEGATIVE
Influenza B by PCR: NEGATIVE
Resp Syncytial Virus by PCR: NEGATIVE
SARS Coronavirus 2 by RT PCR: NEGATIVE

## 2023-08-13 LAB — GLUCOSE, CAPILLARY: Glucose-Capillary: 95 mg/dL (ref 70–99)

## 2023-08-13 LAB — MAGNESIUM: Magnesium: 2.6 mg/dL — ABNORMAL HIGH (ref 1.7–2.4)

## 2023-08-13 LAB — VITAMIN B12: Vitamin B-12: 1768 pg/mL — ABNORMAL HIGH (ref 180–914)

## 2023-08-13 MED ORDER — SODIUM CHLORIDE 0.9 % IV SOLN
1.0000 g | INTRAVENOUS | Status: DC
  Administered 2023-08-13 – 2023-08-14 (×2): 1 g via INTRAVENOUS
  Filled 2023-08-13 (×2): qty 10

## 2023-08-13 MED ORDER — LACTATED RINGERS IV BOLUS
500.0000 mL | Freq: Once | INTRAVENOUS | Status: AC
  Administered 2023-08-13: 500 mL via INTRAVENOUS

## 2023-08-13 MED ORDER — LACTATED RINGERS IV SOLN: INTRAVENOUS | Status: DC

## 2023-08-13 MED ORDER — ENOXAPARIN SODIUM 30 MG/0.3ML IJ SOSY
30.0000 mg | PREFILLED_SYRINGE | INTRAMUSCULAR | Status: DC
  Administered 2023-08-13 – 2023-08-18 (×6): 30 mg via SUBCUTANEOUS
  Filled 2023-08-13 (×6): qty 0.3

## 2023-08-13 MED ORDER — SODIUM CHLORIDE 0.9 % IV BOLUS
500.0000 mL | Freq: Once | INTRAVENOUS | Status: AC
  Administered 2023-08-13: 500 mL via INTRAVENOUS

## 2023-08-13 MED ORDER — PIPERACILLIN-TAZOBACTAM 3.375 G IVPB 30 MIN
3.3750 g | Freq: Once | INTRAVENOUS | Status: AC
  Administered 2023-08-13: 3.375 g via INTRAVENOUS
  Filled 2023-08-13: qty 50

## 2023-08-13 NOTE — ED Notes (Signed)
Changed Pt's catheter.  Dr. Jeanene Erb to state that they want to remove the Catheter and try bladder scanning him every 4-6 hours. Dr agreed to let it stay in until the Pt went upstairs.
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
## 2023-08-13 NOTE — ED Notes (Signed)
Pt placed in mittens. Pt was pulling at wires and taking off vital equipment.
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
## 2023-08-13 NOTE — ED Provider Notes (Signed)
[place] EMERGENCY DEPARTMENT AT [facility] Provider Note   CSN: [id number] Arrival date & time: 08/13/23  0957     History  Chief Complaint  Patient presents with   Altered Mental Status    Aaron Whitaker is a 82 y.o. male patient with past medical history of coronary artery disease history of CABG, hyperlipidemia, hypertension, aphasia, presenting to emergency room with 2 to 3 weeks of gradual decline in mental status. Family note rapid decline within last 10 days.  Reports 4 days of urinary incontinence and urinary retention leading to Foley catheter placement.  Patient was recently placed on hospice.  Family is concerned that he has some cause of delirium.  Family reports that over a month ago he was ambulating and able to answer questions, now patient is unable to ambulate, not able to hold conversations.  Patient unable to answer review of systems.  Family wishes for patient to be treated and admitted into hospital if infection is found.    Altered Mental Status      Home Medications Prior to Admission medications   Medication Sig Start Date End Date Taking? Authorizing Provider  amLODipine (NORVASC) 5 MG tablet Take 5 mg by mouth daily.    [provider]  aspirin (ASPIRIN CHILDRENS) 81 MG chewable tablet Chew 1 tablet (81 mg total) by mouth daily. 03/20/21   Yates Decamp, MD  Bioflavonoid Products (ESTER C PO) Take by mouth.    [provider]  carvedilol (COREG) 3.125 MG tablet TAKE 1 TABLET TWICE DAILY 09/19/21   Yates Decamp, MD  clopidogrel (PLAVIX) 75 MG tablet Take 75 mg by mouth daily. 09/19/21   [provider]  Cyanocobalamin (B-12) 2500 MCG TABS Take 2,500 mcg by mouth daily.    [provider]  gabapentin (NEURONTIN) 100 MG capsule Take 300 mg by mouth 2 (two) times daily. 03/01/19   [provider]  Multiple Vitamins-Minerals (PRESERVISION AREDS 2 PO) Take by mouth 2 (two) times daily before a meal.     [provider]  Omega-3 Fatty Acids (FISH OIL) 1000 MG CAPS Take 1 capsule by mouth daily.    [provider]  rosuvastatin (CRESTOR) 40 MG tablet TAKE 1 TABLET EVERY DAY 06/16/21   Yates Decamp, MD  vitamin C (ASCORBIC ACID) 500 MG tablet Take 500 mg by mouth daily.    [provider]      Allergies    Patient has no known allergies.    Review of Systems   Review of Systems  Constitutional:  Positive for activity change.    Physical Exam Updated Vital Signs BP 119/84   Pulse (!) 101   SpO2 95%  Physical Exam Vitals and nursing note reviewed.  Constitutional:      General: He is not in acute distress.    Appearance: He is ill-appearing. He is not toxic-appearing.     Comments: Appears dry.   HENT:     Head: Normocephalic and atraumatic.  Eyes:     General: No scleral icterus.    Conjunctiva/sclera: Conjunctivae normal.     Pupils: Pupils are equal, round, and reactive to light.  Cardiovascular:     Rate and Rhythm: Normal rate and regular rhythm.     Pulses: Normal pulses.     Heart sounds: Normal heart sounds.  Pulmonary:     Effort: Pulmonary effort is normal. No respiratory distress.     Breath sounds: Normal breath sounds.  Abdominal:  General: Abdomen is flat. Bowel sounds are normal.     Palpations: Abdomen is soft.     Tenderness: There is no abdominal tenderness.  Genitourinary:    Comments: Foley catheter in place. Musculoskeletal:     Right lower leg: No edema.     Left lower leg: No edema.  Skin:    General: Skin is warm and dry.     Findings: No lesion.  Neurological:     General: No focal deficit present.     Mental Status: He is alert. Mental status is at baseline. He is disoriented.     Comments: Patient is confused, withdrawing to pain, opening eyes spontaneously, mumbling.      ED Results / Procedures / Treatments   Labs (all labs ordered are listed, but only abnormal results are displayed) Labs Reviewed   COMPREHENSIVE METABOLIC PANEL - Abnormal; Notable for the following components:      Result Value   Sodium 147 (*)    Chloride 119 (*)    CO2 19 (*)    Glucose, Bld 104 (*)    BUN 42 (*)    Creatinine, Ser 2.76 (*)    Albumin 2.8 (*)    Alkaline Phosphatase 228 (*)    GFR, Estimated 22 (*)    All other components within normal limits  CBC - Abnormal; Notable for the following components:   RBC 4.15 (*)    Hemoglobin 12.6 (*)    All other components within normal limits  URINALYSIS, W/ REFLEX TO CULTURE (INFECTION SUSPECTED) - Abnormal; Notable for the following components:   APPearance CLOUDY (*)    Hgb urine dipstick MODERATE (*)    Ketones, ur 5 (*)    Protein, ur 100 (*)    Nitrite POSITIVE (*)    Leukocytes,Ua LARGE (*)    Bacteria, UA MANY (*)    All other components within normal limits  MAGNESIUM - Abnormal; Notable for the following components:   Magnesium 2.6 (*)    All other components within normal limits  URINE CULTURE  CBG MONITORING, ED    EKG None  Radiology DG Chest Portable 1 View Result Date: 08/13/2023 CLINICAL DATA:  Altered mental status EXAM: PORTABLE CHEST 1 VIEW COMPARISON:  08/01/2023 FINDINGS: Stable elevation of the right hemidiaphragm with mild scarring or atelectasis at the right lung base. Prior CABG. Atherosclerotic calcification of the aortic arch. Loop recorder noted. ASD closure device unchanged in position. The lungs remain otherwise clear. No blunting of the costophrenic angles. IMPRESSION: 1. Stable elevation of the right hemidiaphragm with mild scarring or atelectasis at the right lung base. 2. Prior CABG. 3. Aortic Atherosclerosis (ICD10-I70.0). Electronically Signed   By: Gaylyn Rong M.D.   On: 08/13/2023 11:15    Procedures Procedures    Medications Ordered in ED Medications - No data to display  ED Course/ Medical Decision Making/ A&P                                 Medical Decision Making Amount and/or Complexity  of Data Reviewed Labs: ordered. Radiology: ordered.  Risk Prescription drug management. Decision regarding hospitalization.   [name] 82 y.o. presented today for AMS. Working Ddx: includes, but not limited to, gastroenteritis, colitis, appendicitis, pancreatitis, nephrolithiasis, UTI, pyelonephritis, CVA, ACS, hemorrhage, infectious causes, dehydration, electrolyte abnormalities, dementia   R/o DDx: These are considered less likely than current impression due to history of present  illness, physical exam, labs/imaging findings.  Review of prior external notes: 08/02/23  Pmhx: coronary artery disease history of CABG, hyperlipidemia, hypertension, aphasia, dementia   Unique Tests and My Interpretation:  CBC: White blood cells 10.4, hemoglobin 12.6 CMP: Sodium 147, BUN 42, creatinine 2.76 without significant decrease in GFR since prior recent labs Mg 2.6 UA: Moderate hemoglobin, positive nitrates, positive leukocytes, many bacteria.  Urine has been sent for culture EKG: sinus    Imaging:  CT of the head to rule out hemorrhage CT chest: Stable without any acute abnormality  Problem List / ED Course / Critical interventions / Medication management  Reporting to emergency room with altered mental status.  Family reports urinary retention and incontinence leading to Foley catheter placement.  Has had Foley catheter in place for the past 3 days.  Patient has had some altered mental status slowly throughout the past 3 weeks worsening in the last 10 days.  Family is concerned about some sort of delirium.  Patient has no leukocytosis and no fever, vitals not indicating sepsis criteria.  On workup patient found to have urinary tract infection.  IV given saline bolus and started IV antibiotics.  I ordered medication including NS, Zosyn for UTI  Reevaluation of the patient after these medicines showed that the patient stayed the same Patients vitals assessed. Upon arrival patient is  hemodynamically stable, slightly tachycardic at 101 I have reviewed the patients home medicines and have made adjustments as needed   Consult: Hospitalist for admission   Plan:  Admit for UTI, AMS, IV antibiotics          Final Clinical Impression(s) / ED Diagnoses Final diagnoses:  Altered mental status, unspecified altered mental status type  Lower urinary tract infectious disease    Rx / DC Orders ED Discharge Orders     None         Appollonia Klee, Horald Chestnut, PA-C 08/13/23 1326    Elayne Snare K, DO 08/13/23 1344

## 2023-08-14 LAB — BASIC METABOLIC PANEL
Anion gap: 9 (ref 5–15)
Anion gap: 12 (ref 5–15)
Anion gap: 10 (ref 5–15)
BUN: 39 mg/dL — ABNORMAL HIGH (ref 8–23)
BUN: 39 mg/dL — ABNORMAL HIGH (ref 8–23)
BUN: 36 mg/dL — ABNORMAL HIGH (ref 8–23)
CO2: 20 mmol/L — ABNORMAL LOW (ref 22–32)
CO2: 20 mmol/L — ABNORMAL LOW (ref 22–32)
CO2: 23 mmol/L (ref 22–32)
Calcium: 8.8 mg/dL — ABNORMAL LOW (ref 8.9–10.3)
Calcium: 8.9 mg/dL (ref 8.9–10.3)
Calcium: 8.6 mg/dL — ABNORMAL LOW (ref 8.9–10.3)
Chloride: 118 mmol/L — ABNORMAL HIGH (ref 98–111)
Chloride: 117 mmol/L — ABNORMAL HIGH (ref 98–111)
Chloride: 116 mmol/L — ABNORMAL HIGH (ref 98–111)
Creatinine, Ser: 2.57 mg/dL — ABNORMAL HIGH (ref 0.61–1.24)
Creatinine, Ser: 2.43 mg/dL — ABNORMAL HIGH (ref 0.61–1.24)
Creatinine, Ser: 2.4 mg/dL — ABNORMAL HIGH (ref 0.61–1.24)
GFR, Estimated: 24 mL/min — ABNORMAL LOW (ref >60)
GFR, Estimated: 26 mL/min — ABNORMAL LOW (ref >60)
GFR, Estimated: 26 mL/min — ABNORMAL LOW (ref >60)
Glucose, Bld: 120 mg/dL — ABNORMAL HIGH (ref 70–99)
Glucose, Bld: 125 mg/dL — ABNORMAL HIGH (ref 70–99)
Glucose, Bld: 110 mg/dL — ABNORMAL HIGH (ref 70–99)
Potassium: 3.7 mmol/L (ref 3.5–5.1)
Potassium: 3.7 mmol/L (ref 3.5–5.1)
Potassium: 3.6 mmol/L (ref 3.5–5.1)
Sodium: 147 mmol/L — ABNORMAL HIGH (ref 135–145)
Sodium: 149 mmol/L — ABNORMAL HIGH (ref 135–145)
Sodium: 149 mmol/L — ABNORMAL HIGH (ref 135–145)

## 2023-08-14 LAB — COMPREHENSIVE METABOLIC PANEL
ALT: 30 U/L (ref 0–44)
AST: 25 U/L (ref 15–41)
Albumin: 2.3 g/dL — ABNORMAL LOW (ref 3.5–5.0)
Alkaline Phosphatase: 199 U/L — ABNORMAL HIGH (ref 38–126)
Anion gap: 8 (ref 5–15)
BUN: 40 mg/dL — ABNORMAL HIGH (ref 8–23)
CO2: 22 mmol/L (ref 22–32)
Calcium: 8.6 mg/dL — ABNORMAL LOW (ref 8.9–10.3)
Chloride: 119 mmol/L — ABNORMAL HIGH (ref 98–111)
Creatinine, Ser: 2.77 mg/dL — ABNORMAL HIGH (ref 0.61–1.24)
GFR, Estimated: 22 mL/min — ABNORMAL LOW (ref >60)
Glucose, Bld: 97 mg/dL (ref 70–99)
Potassium: 3.5 mmol/L (ref 3.5–5.1)
Sodium: 149 mmol/L — ABNORMAL HIGH (ref 135–145)
Total Bilirubin: 0.9 mg/dL (ref <1.2)
Total Protein: 5.7 g/dL — ABNORMAL LOW (ref 6.5–8.1)

## 2023-08-14 LAB — CBC
HCT: 32.6 % — ABNORMAL LOW (ref 39.0–52.0)
Hemoglobin: 10.6 g/dL — ABNORMAL LOW (ref 13.0–17.0)
MCH: 30.6 pg (ref 26.0–34.0)
MCHC: 32.5 g/dL (ref 30.0–36.0)
MCV: 94.2 fL (ref 80.0–100.0)
Platelets: 312 10*3/uL (ref 150–400)
RBC: 3.46 MIL/uL — ABNORMAL LOW (ref 4.22–5.81)
RDW: 12.9 % (ref 11.5–15.5)
WBC: 8 10*3/uL (ref 4.0–10.5)
nRBC: 0 % (ref 0.0–0.2)

## 2023-08-14 LAB — RPR: RPR Ser Ql: NONREACTIVE

## 2023-08-14 MED ORDER — DEXTROSE 5 % IV SOLN: INTRAVENOUS | Status: AC

## 2023-08-14 MED ORDER — DEXTROSE-SODIUM CHLORIDE 5-0.45 % IV SOLN: INTRAVENOUS | Status: DC

## 2023-08-14 NOTE — Plan of Care (Signed)
  Problem: Elimination: Goal: Will not experience complications related to bowel motility Outcome: Progressing Goal: Will not experience complications related to urinary retention Outcome: Progressing   Problem: Safety: Goal: Ability to remain free from injury will improve Outcome: Progressing   Problem: Skin Integrity: Goal: Risk for impaired skin integrity will decrease Outcome: Progressing   Problem: Clinical Measurements: Goal: Ability to maintain clinical measurements within normal limits will improve Outcome: Progressing Goal: Will remain free from infection Outcome: Progressing Goal: Diagnostic test results will improve Outcome: Progressing Goal: Respiratory complications will improve Outcome: Progressing Goal: Cardiovascular complication will be avoided Outcome: Progressing
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
## 2023-08-14 NOTE — Plan of Care (Signed)
  Problem: Health Behavior/Discharge Planning: Goal: Ability to manage health-related needs will improve Outcome: Progressing   Problem: Clinical Measurements: Goal: Will remain free from infection Outcome: Progressing   Problem: Clinical Measurements: Goal: Diagnostic test results will improve Outcome: Progressing   Problem: Clinical Measurements: Goal: Respiratory complications will improve Outcome: Progressing   Problem: Nutrition: Goal: Adequate nutrition will be maintained Outcome: Progressing   Problem: Coping: Goal: Level of anxiety will decrease Outcome: Progressing   Problem: Safety: Goal: Ability to remain free from injury will improve Outcome: Progressing   Problem: Skin Integrity: Goal: Risk for impaired skin integrity will decrease Outcome: Progressing

## 2023-08-15 DIAGNOSIS — N390 Urinary tract infection, site not specified: Secondary | ICD-10-CM | POA: Diagnosis not present

## 2023-08-15 DIAGNOSIS — G9341 Metabolic encephalopathy: Secondary | ICD-10-CM | POA: Diagnosis not present

## 2023-08-15 LAB — BASIC METABOLIC PANEL
Anion gap: 6 (ref 5–15)
Anion gap: 6 (ref 5–15)
BUN: 32 mg/dL — ABNORMAL HIGH (ref 8–23)
BUN: 32 mg/dL — ABNORMAL HIGH (ref 8–23)
CO2: 21 mmol/L — ABNORMAL LOW (ref 22–32)
CO2: 23 mmol/L (ref 22–32)
Calcium: 8.5 mg/dL — ABNORMAL LOW (ref 8.9–10.3)
Calcium: 8.3 mg/dL — ABNORMAL LOW (ref 8.9–10.3)
Chloride: 117 mmol/L — ABNORMAL HIGH (ref 98–111)
Chloride: 116 mmol/L — ABNORMAL HIGH (ref 98–111)
Creatinine, Ser: 2.26 mg/dL — ABNORMAL HIGH (ref 0.61–1.24)
Creatinine, Ser: 2.4 mg/dL — ABNORMAL HIGH (ref 0.61–1.24)
GFR, Estimated: 28 mL/min — ABNORMAL LOW (ref >60)
GFR, Estimated: 26 mL/min — ABNORMAL LOW (ref >60)
Glucose, Bld: 122 mg/dL — ABNORMAL HIGH (ref 70–99)
Glucose, Bld: 108 mg/dL — ABNORMAL HIGH (ref 70–99)
Potassium: 3.4 mmol/L — ABNORMAL LOW (ref 3.5–5.1)
Potassium: 3.4 mmol/L — ABNORMAL LOW (ref 3.5–5.1)
Sodium: 144 mmol/L (ref 135–145)
Sodium: 145 mmol/L (ref 135–145)

## 2023-08-15 LAB — COMPREHENSIVE METABOLIC PANEL
ALT: 32 U/L (ref 0–44)
AST: 33 U/L (ref 15–41)
Albumin: 2.2 g/dL — ABNORMAL LOW (ref 3.5–5.0)
Alkaline Phosphatase: 183 U/L — ABNORMAL HIGH (ref 38–126)
Anion gap: 4 — ABNORMAL LOW (ref 5–15)
BUN: 34 mg/dL — ABNORMAL HIGH (ref 8–23)
CO2: 22 mmol/L (ref 22–32)
Calcium: 8.4 mg/dL — ABNORMAL LOW (ref 8.9–10.3)
Chloride: 121 mmol/L — ABNORMAL HIGH (ref 98–111)
Creatinine, Ser: 2.45 mg/dL — ABNORMAL HIGH (ref 0.61–1.24)
GFR, Estimated: 25 mL/min — ABNORMAL LOW (ref >60)
Glucose, Bld: 105 mg/dL — ABNORMAL HIGH (ref 70–99)
Potassium: 3.5 mmol/L (ref 3.5–5.1)
Sodium: 147 mmol/L — ABNORMAL HIGH (ref 135–145)
Total Bilirubin: 0.6 mg/dL (ref <1.2)
Total Protein: 5.3 g/dL — ABNORMAL LOW (ref 6.5–8.1)

## 2023-08-15 LAB — CBC
HCT: 32.8 % — ABNORMAL LOW (ref 39.0–52.0)
Hemoglobin: 10.5 g/dL — ABNORMAL LOW (ref 13.0–17.0)
MCH: 30.4 pg (ref 26.0–34.0)
MCHC: 32 g/dL (ref 30.0–36.0)
MCV: 95.1 fL (ref 80.0–100.0)
Platelets: 253 10*3/uL (ref 150–400)
RBC: 3.45 MIL/uL — ABNORMAL LOW (ref 4.22–5.81)
RDW: 13 % (ref 11.5–15.5)
WBC: 6.6 10*3/uL (ref 4.0–10.5)
nRBC: 0 % (ref 0.0–0.2)

## 2023-08-15 MED ORDER — PIPERACILLIN-TAZOBACTAM 3.375 G IVPB
3.3750 g | Freq: Three times a day (TID) | INTRAVENOUS | Status: DC
Start: 2023-08-15 — End: 2023-08-19
  Administered 2023-08-15 – 2023-08-19 (×11): 3.375 g via INTRAVENOUS
  Filled 2023-08-15 (×13): qty 50

## 2023-08-15 MED ORDER — DEXTROSE 5 % IV SOLN
INTRAVENOUS | Status: DC
Start: 2023-08-15 — End: 2023-08-15

## 2023-08-15 MED ORDER — SODIUM CHLORIDE 0.9 % IV SOLN: 1.0000 g | INTRAVENOUS | Status: DC

## 2023-08-15 MED ORDER — DEXTROSE 5 % IV SOLN: INTRAVENOUS | Status: AC
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
## 2023-08-15 NOTE — Progress Notes (Signed)
Pharmacy Antibiotic Note  Aaron Whitaker is a 82 y.o. male admitted on 08/13/2023 with Klebsiella pneumo and E.coli UTI.  Patient previously received Zosyn x1 and CRO. Per sensitivities, pharmacy now being consulted for Zosyn dosing.   Scr 2.2 @ [facility] (CKD), Afebrile, WBC   Plan: Zosyn 3.375g IV q8h (4 hour infusion). F/U LOT, de-escalation as appropriate  Monitor renal function, fever trend, clinical progress    Temp (24hrs), Avg:98 F (36.7 C), Min:97.5 F (36.4 C), Max:98.2 F (36.8 C)  Recent Labs  Lab 08/13/23 1019 08/13/23 1534 08/14/23 0558 08/14/23 0953 08/14/23 1404 08/14/23 2039 08/15/23 0709 08/15/23 1157  WBC 10.4  --  8.0  --   --   --  6.6  --   CREATININE 2.76*   < > 2.77* 2.57* 2.43* 2.40* 2.45* 2.26*   < > = values in this interval not displayed.    CrCl cannot be calculated (Unknown ideal weight.).    No Known Allergies  Antimicrobials this admission: Zosyn 12/13x1; 12/15 >> CRO 12/13 -12/14   Dose adjustments this admission: None   Microbiology results: 12/13: u.cx: >100k Klebeislla pneumo +>100k E.coli  -resistant: amp, unasyn, CRO, bactrim, macrobid  Thank you for allowing pharmacy to be a part of this patient's care.  Lilyanah Celestin 08/15/2023 2:38 PM

## 2023-08-15 NOTE — Plan of Care (Signed)
  Problem: Elimination: Goal: Will not experience complications related to bowel motility Outcome: Progressing Goal: Will not experience complications related to urinary retention Outcome: Progressing   Problem: Safety: Goal: Ability to remain free from injury will improve Outcome: Progressing   Problem: Skin Integrity: Goal: Risk for impaired skin integrity will decrease Outcome: Progressing   Problem: Clinical Measurements: Goal: Ability to maintain clinical measurements within normal limits will improve Outcome: Progressing Goal: Will remain free from infection Outcome: Progressing Goal: Diagnostic test results will improve Outcome: Progressing Goal: Respiratory complications will improve Outcome: Progressing Goal: Cardiovascular complication will be avoided Outcome: Progressing

## 2023-08-15 NOTE — Plan of Care (Addendum)
Calm and cooperative throughout the day. Assisted to bedside commode with 2-assist.   Problem: Clinical Measurements: Goal: Will remain free from infection Outcome: Progressing   Problem: Clinical Measurements: Goal: Diagnostic test results will improve Outcome: Progressing   Problem: Skin Integrity: Goal: Risk for impaired skin integrity will decrease Outcome: Progressing   Problem: Education: Goal: Knowledge of General Education information will improve Description: Including pain rating scale, medication(s)/side effects and non-pharmacologic comfort measures Outcome: Progressing

## 2023-08-15 NOTE — Hospital Course (Addendum)
End Stage Dementia  Acute metabolic encephalopathy  UTI >100,000 klebsiella pneumonia Patient presented with acute encephalopathy. Head CT did not reveal acute intracranial finds. Urine culture grew klebsiella pneumonia and he was originally started on a regimen of ceftriaxone and completed three days total of treatment. Susceptibilities returned and showed resistance to ceftriaxone. He completed 5 days of zosyn. We spoke with the son on 08/19/2023 who would like to begin the transition to comfort care. He understands that the UTI has been adequately treated without a change in patient's cognition, quality of life and that he nearing the end of his life. He will be discharged [facility]  Hypernatremia Sodium of 147 on admission most likely due to poor oral intake, resolved to 140 with IVF.    Elevated alk phos Alkaline phosphatase of 228 on admission, improved to 163.   Stage II decubitus ulcer Patient with stage II decubitus ulcer on admission. Stable.    AKI on CKD stage IIIb Non-anion gap metabolic acidosis Creatinine of 2.76 with GFR of 22 on admission, appeared to be very dehydrated. BUN 42, Bicarb of 19. NAGMA likely related to chronic kidney disease. Serum creatinine improved to 2.45 and 2.26 today with GFR 25-28, BUN 32-34, and Bicarb 21-22 s/p IV fluids. Resolved   Normocytic anemia Chronic. Did not require transfusion.

## 2023-08-16 DIAGNOSIS — G9341 Metabolic encephalopathy: Secondary | ICD-10-CM | POA: Diagnosis not present

## 2023-08-16 DIAGNOSIS — L8990 Pressure ulcer of unspecified site, unspecified stage: Secondary | ICD-10-CM | POA: Insufficient documentation

## 2023-08-16 LAB — BASIC METABOLIC PANEL
Anion gap: 5 (ref 5–15)
BUN: 31 mg/dL — ABNORMAL HIGH (ref 8–23)
CO2: 23 mmol/L (ref 22–32)
Calcium: 8.2 mg/dL — ABNORMAL LOW (ref 8.9–10.3)
Chloride: 114 mmol/L — ABNORMAL HIGH (ref 98–111)
Creatinine, Ser: 2.3 mg/dL — ABNORMAL HIGH (ref 0.61–1.24)
GFR, Estimated: 27 mL/min — ABNORMAL LOW (ref >60)
Glucose, Bld: 105 mg/dL — ABNORMAL HIGH (ref 70–99)
Potassium: 3.3 mmol/L — ABNORMAL LOW (ref 3.5–5.1)
Sodium: 142 mmol/L (ref 135–145)

## 2023-08-16 LAB — CBC
HCT: 28 % — ABNORMAL LOW (ref 39.0–52.0)
Hemoglobin: 9.1 g/dL — ABNORMAL LOW (ref 13.0–17.0)
MCH: 30.8 pg (ref 26.0–34.0)
MCHC: 32.5 g/dL (ref 30.0–36.0)
MCV: 94.9 fL (ref 80.0–100.0)
Platelets: 214 10*3/uL (ref 150–400)
RBC: 2.95 MIL/uL — ABNORMAL LOW (ref 4.22–5.81)
RDW: 13 % (ref 11.5–15.5)
WBC: 5.8 10*3/uL (ref 4.0–10.5)
nRBC: 0 % (ref 0.0–0.2)

## 2023-08-16 LAB — MAGNESIUM: Magnesium: 2.1 mg/dL (ref 1.7–2.4)

## 2023-08-16 MED ORDER — ORAL CARE MOUTH RINSE
15.0000 mL | OROMUCOSAL | Status: DC | PRN
  Administered 2023-08-17: 15 mL via OROMUCOSAL

## 2023-08-16 MED ORDER — DEXTROSE-SODIUM CHLORIDE 5-0.45 % IV SOLN: INTRAVENOUS | Status: AC

## 2023-08-16 MED ORDER — ACETAMINOPHEN 650 MG RE SUPP: 650.0000 mg | Freq: Three times a day (TID) | RECTAL | Status: DC | PRN
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
## 2023-08-16 NOTE — Plan of Care (Signed)
Has been more talkative and interactive today.  Has even joked with staff during care.  Denies pain at this time.  Son at his side and has been here most of the day with him.  SBP did go into the 80s and he was asymptomatic and I did notify the MD.  Dr had me to just recheck a short while later then he went up to 98 SBP.  No signs of distress.  No complaints.   Problem: Clinical Measurements: Goal: Diagnostic test results will improve Outcome: Progressing   Problem: Clinical Measurements: Goal: Respiratory complications will improve Outcome: Progressing   Problem: Activity: Goal: Risk for activity intolerance will decrease Outcome: Progressing   Problem: Nutrition: Goal: Adequate nutrition will be maintained Outcome: Progressing   Problem: Coping: Goal: Level of anxiety will decrease Outcome: Progressing

## 2023-08-16 NOTE — Care Management Important Message (Signed)
Important Message  Patient Details  Name: Aaron Whitaker MRN: [id number] Date of Birth: 1940-11-29   Important Message Given:  Yes - Medicare IM     Dorena Bodo 08/16/2023, 3:24 PM

## 2023-08-16 NOTE — TOC CM/SW Note (Signed)
Transition of Care [facility]) - Inpatient Brief Assessment   Patient Details  Name: Aaron Whitaker MRN: [id number] Date of Birth: Jan 16, 1941  Transition of Care [facility]) CM/SW Contact:    Baldemar Lenis, LCSW Phone Number: 08/16/2023, 10:51 AM   Clinical Narrative:   Patient from [facility]. Note per chart review that family is following for goals of care pending clinical progression, patient on IV antibiotics for UTI. CSW to follow for disposition when appropriate.    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

## 2023-08-17 DIAGNOSIS — G9341 Metabolic encephalopathy: Secondary | ICD-10-CM | POA: Diagnosis not present

## 2023-08-17 LAB — CBC
HCT: 28.6 % — ABNORMAL LOW (ref 39.0–52.0)
Hemoglobin: 9.3 g/dL — ABNORMAL LOW (ref 13.0–17.0)
MCH: 30.6 pg (ref 26.0–34.0)
MCHC: 32.5 g/dL (ref 30.0–36.0)
MCV: 94.1 fL (ref 80.0–100.0)
Platelets: 186 10*3/uL (ref 150–400)
RBC: 3.04 MIL/uL — ABNORMAL LOW (ref 4.22–5.81)
RDW: 12.9 % (ref 11.5–15.5)
WBC: 5.3 10*3/uL (ref 4.0–10.5)
nRBC: 0 % (ref 0.0–0.2)

## 2023-08-17 LAB — COMPREHENSIVE METABOLIC PANEL
ALT: 29 U/L (ref 0–44)
AST: 28 U/L (ref 15–41)
Albumin: 2 g/dL — ABNORMAL LOW (ref 3.5–5.0)
Alkaline Phosphatase: 163 U/L — ABNORMAL HIGH (ref 38–126)
Anion gap: 5 (ref 5–15)
BUN: 24 mg/dL — ABNORMAL HIGH (ref 8–23)
CO2: 20 mmol/L — ABNORMAL LOW (ref 22–32)
Calcium: 7.8 mg/dL — ABNORMAL LOW (ref 8.9–10.3)
Chloride: 115 mmol/L — ABNORMAL HIGH (ref 98–111)
Creatinine, Ser: 2.27 mg/dL — ABNORMAL HIGH (ref 0.61–1.24)
GFR, Estimated: 28 mL/min — ABNORMAL LOW (ref >60)
Glucose, Bld: 106 mg/dL — ABNORMAL HIGH (ref 70–99)
Potassium: 3.2 mmol/L — ABNORMAL LOW (ref 3.5–5.1)
Sodium: 140 mmol/L (ref 135–145)
Total Bilirubin: 0.7 mg/dL (ref <1.2)
Total Protein: 4.9 g/dL — ABNORMAL LOW (ref 6.5–8.1)

## 2023-08-17 LAB — URINE CULTURE: Culture: >=100000 — AB

## 2023-08-17 MED ORDER — POTASSIUM CHLORIDE CRYS ER 20 MEQ PO TBCR
40.0000 meq | EXTENDED_RELEASE_TABLET | Freq: Once | ORAL | Status: AC
  Administered 2023-08-17: 40 meq via ORAL
  Filled 2023-08-17: qty 2

## 2023-08-17 MED ORDER — DEXTROSE-SODIUM CHLORIDE 5-0.45 % IV SOLN: INTRAVENOUS | Status: DC

## 2023-08-17 MED ORDER — DEXTROSE-SODIUM CHLORIDE 5-0.45 % IV SOLN
INTRAVENOUS | Status: AC
Start: 2023-08-17 — End: 2023-08-18

## 2023-08-17 MED ORDER — QUETIAPINE FUMARATE 25 MG PO TABS
25.0000 mg | ORAL_TABLET | Freq: Every day | ORAL | Status: DC
  Administered 2023-08-17 – 2023-08-18 (×2): 25 mg via ORAL
  Filled 2023-08-17 (×2): qty 1
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
## 2023-08-17 NOTE — Plan of Care (Signed)
  Problem: Clinical Measurements: Goal: Will remain free from infection Outcome: Progressing   Problem: Activity: Goal: Risk for activity intolerance will decrease Outcome: Progressing   Problem: Nutrition: Goal: Adequate nutrition will be maintained Outcome: Progressing   Problem: Safety: Goal: Ability to remain free from injury will improve Outcome: Progressing   
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
## 2023-08-18 ENCOUNTER — Encounter (HOSPITAL_COMMUNITY): Payer: Self-pay | Admitting: Internal Medicine

## 2023-08-18 ENCOUNTER — Other Ambulatory Visit: Payer: Self-pay

## 2023-08-18 DIAGNOSIS — G9341 Metabolic encephalopathy: Secondary | ICD-10-CM | POA: Diagnosis not present

## 2023-08-18 MED ORDER — ENSURE ENLIVE PO LIQD
237.0000 mL | Freq: Two times a day (BID) | ORAL | Status: DC
  Administered 2023-08-18 – 2023-08-19 (×3): 237 mL via ORAL

## 2023-08-18 NOTE — Progress Notes (Signed)
OT Cancellation Note  Patient Details Name: Tyjah Schuller MRN: [id number] DOB: Aug 08, 1941   Cancelled Treatment:    Reason Eval/Treat Not Completed: OT screened, no needs identified, will sign off. Per chart review POC is inpatient hospice. Should dc plans change, please feel free to re-order.   Evern Bio Teofilo Lupinacci 08/18/2023, 11:20 AM  Nyoka Cowden OTR/L Acute Rehabilitation Services Office: [phone]
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
## 2023-08-18 NOTE — Progress Notes (Signed)
PT Cancellation Note  Patient Details Name: Aaron Whitaker MRN: [id number] DOB: 1940-09-21   Cancelled Treatment:    Reason Eval/Treat Not Completed: PT screened, no needs identified, will sign off  Note the decision has been made to transition to Hospice.    Jerolyn Center, PT Acute Rehabilitation Services  Office [phone]  Zena Amos 08/18/2023, 10:00 AM

## 2023-08-18 NOTE — Plan of Care (Signed)
Pleasantly confused.  Does answer questions and is more alert with his eyes.  Drinking more fluids, although said he does not have much of an appetite.    Problem: Clinical Measurements: Goal: Ability to maintain clinical measurements within normal limits will improve Outcome: Progressing   Problem: Clinical Measurements: Goal: Will remain free from infection Outcome: Progressing   Problem: Clinical Measurements: Goal: Diagnostic test results will improve Outcome: Progressing   Problem: Clinical Measurements: Goal: Respiratory complications will improve Outcome: Progressing   Problem: Activity: Goal: Risk for activity intolerance will decrease Outcome: Progressing   Problem: Nutrition: Goal: Adequate nutrition will be maintained Outcome: Progressing

## 2023-08-18 NOTE — Plan of Care (Signed)
Plan of care is reviewed. Pt is confused. He pulled his soft mittens out and pulled one IV catheter out.  He is forgetful, oriented to self, able to follow commands. He has been stable hemodynamically, afebrile, normal respiratory efforts, no acute distress , stable with no new noted of neurological changed overnight. He is able to sleep well. We will continue to monitor. Problem: Clinical Measurements: Goal: Ability to maintain clinical measurements within normal limits will improve Outcome: Progressing Goal: Will remain free from infection Outcome: Progressing Goal: Diagnostic test results will improve Outcome: Progressing Goal: Respiratory complications will improve Outcome: Progressing Goal: Cardiovascular complication will be avoided Outcome: Progressing   Problem: Activity: Goal: Risk for activity intolerance will decrease Outcome:  Progressing   Problem: Nutrition: Goal: Adequate nutrition will be maintained Outcome: Progressing   Problem: Elimination: Goal: Will not experience complications related to bowel motility Outcome: Progressing Goal: Will not experience complications related to urinary retention Outcome: Progressing   Problem: Pain Management: Goal: General experience of comfort will improve Outcome: Progressing   Problem: Skin Integrity: Goal: Risk for impaired skin integrity will decrease Outcome: Progressing  Filiberto Pinks, RN

## 2023-08-19 ENCOUNTER — Other Ambulatory Visit: Payer: Self-pay

## 2023-08-19 DIAGNOSIS — G9341 Metabolic encephalopathy: Secondary | ICD-10-CM | POA: Diagnosis not present

## 2023-08-19 DIAGNOSIS — E870 Hyperosmolality and hypernatremia: Secondary | ICD-10-CM

## 2023-08-19 DIAGNOSIS — D649 Anemia, unspecified: Secondary | ICD-10-CM | POA: Insufficient documentation

## 2023-08-19 DIAGNOSIS — E876 Hypokalemia: Secondary | ICD-10-CM

## 2023-08-19 LAB — BASIC METABOLIC PANEL
Anion gap: 3 — ABNORMAL LOW (ref 5–15)
BUN: 18 mg/dL (ref 8–23)
CO2: 22 mmol/L (ref 22–32)
Calcium: 7.9 mg/dL — ABNORMAL LOW (ref 8.9–10.3)
Chloride: 116 mmol/L — ABNORMAL HIGH (ref 98–111)
Creatinine, Ser: 1.96 mg/dL — ABNORMAL HIGH (ref 0.61–1.24)
GFR, Estimated: 33 mL/min — ABNORMAL LOW (ref >60)
Glucose, Bld: 86 mg/dL (ref 70–99)
Potassium: 3.2 mmol/L — ABNORMAL LOW (ref 3.5–5.1)
Sodium: 141 mmol/L (ref 135–145)

## 2023-08-19 MED ORDER — POTASSIUM CHLORIDE CRYS ER 20 MEQ PO TBCR
40.0000 meq | EXTENDED_RELEASE_TABLET | Freq: Two times a day (BID) | ORAL | Status: DC
  Administered 2023-08-19: 40 meq via ORAL
  Filled 2023-08-19: qty 2

## 2023-08-19 MED ORDER — GLYCOPYRROLATE 1 MG PO TABS: 1.0000 mg | ORAL_TABLET | ORAL | Status: DC | PRN

## 2023-08-19 MED ORDER — HALOPERIDOL LACTATE 5 MG/ML IJ SOLN
2.5000 mg | INTRAMUSCULAR | Status: DC | PRN
Start: 2023-08-19 — End: 2023-08-24

## 2023-08-19 MED ORDER — ACETAMINOPHEN 650 MG RE SUPP: 650.0000 mg | Freq: Four times a day (QID) | RECTAL | Status: DC | PRN

## 2023-08-19 MED ORDER — MORPHINE 100MG IN NS 100ML (1MG/ML) PREMIX INFUSION: 0.0000 mg/h | INTRAVENOUS | Status: DC

## 2023-08-19 MED ORDER — GLYCOPYRROLATE 0.2 MG/ML IJ SOLN: 0.2000 mg | INTRAMUSCULAR | Status: DC | PRN

## 2023-08-19 MED ORDER — MORPHINE BOLUS VIA INFUSION
5.0000 mg | INTRAVENOUS | Status: DC | PRN
Start: 2023-08-19 — End: 2023-08-24

## 2023-08-19 MED ORDER — SODIUM CHLORIDE 0.9 % IV SOLN: INTRAVENOUS | Status: DC

## 2023-08-19 MED ORDER — POLYVINYL ALCOHOL 1.4 % OP SOLN: 1.0000 [drp] | Freq: Four times a day (QID) | OPHTHALMIC | Status: DC | PRN

## 2023-08-19 MED ORDER — ACETAMINOPHEN 325 MG PO TABS
650.0000 mg | ORAL_TABLET | Freq: Four times a day (QID) | ORAL | Status: DC | PRN
  Filled 2023-08-19: qty 2

## 2023-08-19 NOTE — Progress Notes (Signed)
SLP Cancellation Note  Patient Details Name: Aaron Whitaker MRN: [id number] DOB: Nov 09, 1940   Cancelled treatment:       Reason Eval/Treat Not Completed: Other (comment) (Plan is for discharge to hospice following completion of treatment of his UTI with IV Zosyn. SLP to s/o at this time.)   Angela Nevin, MA, CCC-SLP Speech Therapy
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
## 2023-08-19 NOTE — TOC Progression Note (Signed)
Transition of Care [facility]) - Progression Note    Patient Details  Name: Aaron Whitaker MRN: [id number] Date of Birth: 1941/08/18  Transition of Care [facility]) CM/SW Contact  Baldemar Lenis, [place] Phone Number: 08/19/2023, 4:19 PM  Clinical Narrative:   CSW updated by MD that patient is now full comfort, family wants hospice of [place]. CSW sent referral, they have reviewed and will follow up tomorrow on eligibility. CSW to follow.    Expected Discharge Plan: Hospice Medical Facility    Expected Discharge Plan and Services                                               Social Determinants of Health (SDOH) Interventions SDOH Screenings   Food Insecurity: Patient Unable To Answer (08/15/2023)  Housing: Patient Unable To Answer (08/15/2023)  Transportation Needs: Patient Unable To Answer (08/15/2023)  Utilities: Patient Unable To Answer (08/15/2023)  Tobacco Use: High Risk (08/18/2023)    Readmission Risk Interventions     No data to display

## 2023-08-19 NOTE — Plan of Care (Signed)
  Problem: Health Behavior/Discharge Planning: Goal: Ability to manage health-related needs will improve Outcome: Progressing   Problem: Clinical Measurements: Goal: Will remain free from infection Outcome: Progressing   Problem: Activity: Goal: Risk for activity intolerance will decrease Outcome: Progressing   Problem: Elimination: Goal: Will not experience complications related to bowel motility Outcome: Progressing   Problem: Pain Management: Goal: General experience of comfort will improve Outcome: Progressing   Problem: Skin Integrity: Goal: Risk for impaired skin integrity will decrease Outcome: Progressing

## 2023-08-20 DIAGNOSIS — G9341 Metabolic encephalopathy: Secondary | ICD-10-CM | POA: Diagnosis not present

## 2023-08-20 MED ORDER — ENSURE ENLIVE PO LIQD
237.0000 mL | Freq: Two times a day (BID) | ORAL | Status: DC
  Administered 2023-08-21 – 2023-08-23 (×5): 237 mL via ORAL
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
## 2023-08-20 NOTE — Discharge Instructions (Signed)
You are being discharged to inpatient hospice. They will provide you with medications for comfort.

## 2023-08-20 NOTE — Progress Notes (Signed)
Received referral for [facility] for patient to be transferred to our inpatient hospice home.  Made visit to bedside for clinical evaluation and spoke with our MD, who felt patient was not appropriate for inpatient facility at the present time.  Spoke with son at the beside about returning back to [place] with hospice services, and they are in agreement with this plan. Updated CM Revonda Standard, who will continue to follow for disposition.    Norm Parcel, RN, BSN  [phone]

## 2023-08-20 NOTE — TOC Progression Note (Addendum)
Transition of Care [facility]) - Progression Note    Patient Details  Name: Aaron Whitaker MRN: [id number] Date of Birth: 24-Jan-1941  Transition of Care [facility]) CM/SW Contact  Delilah Shan, LCSWA Phone Number: 08/20/2023, 11:02 AM  Clinical Narrative:      CSW spoke with Cheri with [facility] who confirmed they received referral and plans on coming to see patient today. Cheri confirmed she will follow up with CSW today with status of referral  Update- CSW received call from Cheri with [facility] who informed CSW that patient not approved currently for residential hospice. Cheri informed CSW that family has decided that they want patient to return to [facility]. Cheri informed CSW that they can get everything together tomorrow for patient to return back to [facility] with hosp[facility] services if medically ready. CSW called [facility] unable to leave voicemail for resident coordinator,voicemail full. CSW will try back to follow up when able.  Update- CSW received call from cheri with [facility] who informed CSW she was able to get in touch with [facility] and they use [facility] at their facility. CSW called [facility]. Facility informed CSW that RN will give CSW a call back.  Update- CSW spoke with [facility] patients son who informed CSW if ALF uses [facility] services that he is in agreement for patient to receive their hospice services when patient returns to ALF. CSW awaiting call back from RN at [facility].  Update- CSW called [facility] Several times unable to get a hold of anyone at facility. Unable to leave voicemail for resident care coordinator voicemail full, unable to leave voicemail. CSW got in touch with operator who informed CSW that RN has already left and she will send her a message to give CSW a call back. CSW will continue to follow.   Expected Discharge Plan: Hospice Medical  Facility    Expected Discharge Plan and Services                                               Social Determinants of Health (SDOH) Interventions SDOH Screenings   Food Insecurity: Patient Unable To Answer (08/15/2023)  Housing: Patient Unable To Answer (08/15/2023)  Transportation Needs: Patient Unable To Answer (08/15/2023)  Utilities: Patient Unable To Answer (08/15/2023)  Tobacco Use: High Risk (08/18/2023)    Readmission Risk Interventions     No data to display

## 2023-08-21 ENCOUNTER — Encounter (HOSPITAL_COMMUNITY): Payer: Self-pay | Admitting: Internal Medicine

## 2023-08-21 DIAGNOSIS — G309 Alzheimer's disease, unspecified: Secondary | ICD-10-CM | POA: Diagnosis not present

## 2023-08-21 DIAGNOSIS — F02C0 Dementia in other diseases classified elsewhere, severe, without behavioral disturbance, psychotic disturbance, mood disturbance, and anxiety: Secondary | ICD-10-CM

## 2023-08-21 DIAGNOSIS — F0390 Unspecified dementia without behavioral disturbance: Secondary | ICD-10-CM

## 2023-08-21 NOTE — Progress Notes (Addendum)
                 Interval history Denies pain or discomfort this morning.  Speaking pleasantly, apparently disoriented, but calm.  Mitts applied overnight because he was pulling off his external urinary catheter.  Physical exam Blood pressure 121/79, pulse 72, temperature 98.1 F (36.7 C), temperature source Oral, resp. rate 18, height 5' 10.5" (1.791 m), weight 80 kg, SpO2 96%.  No distress Heart rate is normal and rhythm is regular, left radial pulse is normal Breathing comfortably on room air Skin is pale, warm, and dry Alert, disoriented  Assessment and plan Hospital day 8  Aaron Whitaker is a 82 y.o. admitted for encephalopathy and now receiving hospice care for end-stage dementia.  Principal Problem:   Dementia (HCC) Active Problems:   Pressure injury of skin   Normocytic anemia  End-stage dementia Decline over several weeks prior to hospitalization.  Disoriented and eating less, declining fluids.  Admitted with hypernatremia.  No reversible causes identified.  Transitioned to supportive comfort measures a couple of days ago and anticipate discharge to SNF with hospice any day now.  Comfortable appearing today.  Resolved Problems:   Acute metabolic encephalopathy   Lower urinary tract infectious disease   Hypokalemia   Hypernatremia  Diet: Full liquids IVF: N/A VTE: N/A Code: DNR PT/OT recommendations: N/A TOC recommendations: Helping coordinate return to [facility] [place]. Family Update: By phone  Discharge plan: To SNF with hospice.  Marrianne Mood MD 08/21/2023, 12:16 PM  Pager: [phone] After 5pm or weekend: [phone]

## 2023-08-22 DIAGNOSIS — G309 Alzheimer's disease, unspecified: Secondary | ICD-10-CM | POA: Diagnosis not present

## 2023-08-22 DIAGNOSIS — F02C0 Dementia in other diseases classified elsewhere, severe, without behavioral disturbance, psychotic disturbance, mood disturbance, and anxiety: Secondary | ICD-10-CM | POA: Diagnosis not present

## 2023-08-22 NOTE — Plan of Care (Signed)
Pt alert and oriented to self. Denies any pain or discomfort. Incontinent of bowel and bladder. Pt able to feed himself. Son to bedside this shift. Will continue with current plan of care.  Problem: Education: Goal: Knowledge of General Education information will improve Description: Including pain rating scale, medication(s)/side effects and non-pharmacologic comfort measures Outcome: Progressing   Problem: Health Behavior/Discharge Planning: Goal: Ability to manage health-related needs will improve Outcome: Progressing   Problem: Clinical Measurements: Goal: Ability to maintain clinical measurements within normal limits will improve Outcome: Progressing Goal: Will remain free from infection Outcome: Progressing Goal: Diagnostic test results will improve Outcome: Progressing Goal: Respiratory complications will improve Outcome: Progressing Goal: Cardiovascular complication will be avoided Outcome: Progressing   Problem: Activity: Goal: Risk for activity intolerance will decrease Outcome: Progressing   Problem: Nutrition: Goal: Adequate nutrition will be maintained Outcome: Progressing   Problem: Coping: Goal: Level of anxiety will decrease Outcome: Progressing   Problem: Elimination: Goal: Will not experience complications related to bowel motility Outcome: Progressing Goal: Will not experience complications related to urinary retention Outcome: Progressing   Problem: Pain Management: Goal: General experience of comfort will improve Outcome: Progressing   Problem: Safety: Goal: Ability to remain free from injury will improve Outcome: Progressing   Problem: Skin Integrity: Goal: Risk for impaired skin integrity will decrease Outcome: Progressing

## 2023-08-22 NOTE — Plan of Care (Signed)
  Problem: Education: Goal: Knowledge of General Education information will improve Description: Including pain rating scale, medication(s)/side effects and non-pharmacologic comfort measures Outcome: Progressing   Problem: Health Behavior/Discharge Planning: Goal: Ability to manage health-related needs will improve Outcome: Progressing   Problem: Clinical Measurements: Goal: Ability to maintain clinical measurements within normal limits will improve Outcome: Progressing Goal: Diagnostic test results will improve Outcome: Progressing Goal: Respiratory complications will improve Outcome: Progressing   Problem: Activity: Goal: Risk for activity intolerance will decrease Outcome: Progressing   Problem: Nutrition: Goal: Adequate nutrition will be maintained Outcome: Progressing   

## 2023-08-22 NOTE — TOC Progression Note (Signed)
Transition of Care [facility]) - Progression Note    Patient Details  Name: Jayvone Diane MRN: [id number] Date of Birth: 20-Oct-1940  Transition of [facility]) CM/SW Contact  Dellie Burns [place], [place] Phone Number: 08/22/2023, 9:55 AM  Clinical Narrative: spoke to [facility] at [facility] who reports they sent all pt's meds back to pharmacy on Friday because they were told the plan was hospice home placement. [facility] does not have pharmacy access today but can plan to admit pt back tomorrow with hospice services. Per Lynden Ang, SW will need to speak with Yehuda Mao or Vernona Rieger at [place] tomorrow [phone].   Dellie Burns, MSW, LCSW [phone] (coverage)      Expected Discharge Plan: Hospice Medical Facility    Expected Discharge Plan and Services                                               Social Determinants of Health (SDOH) Interventions SDOH Screenings   Food Insecurity: Patient Unable To Answer (08/15/2023)  Housing: Patient Unable To Answer (08/15/2023)  Transportation Needs: Patient Unable To Answer (08/15/2023)  Utilities: Patient Unable To Answer (08/15/2023)  Tobacco Use: High Risk (08/18/2023)    Readmission Risk Interventions     No data to display

## 2023-08-22 NOTE — Plan of Care (Signed)
  Problem: Education: Goal: Knowledge of General Education information will improve Description: Including pain rating scale, medication(s)/side effects and non-pharmacologic comfort measures 08/22/2023 2035 by Nicole Cella, RN Outcome: Progressing 08/22/2023 0652 by Nicole Cella, RN Outcome: Progressing   Problem: Health Behavior/Discharge Planning: Goal: Ability to manage health-related needs will improve 08/22/2023 2035 by Nicole Cella, RN Outcome: Progressing 08/22/2023 0652 by Nicole Cella, RN Outcome: Progressing   Problem: Clinical Measurements: Goal: Ability to maintain clinical measurements within normal limits will improve 08/22/2023 2035 by Nicole Cella, RN Outcome: Progressing 08/22/2023 0652 by Nicole Cella, RN Outcome: Progressing Goal: Will remain free from infection Outcome: Progressing Goal: Diagnostic test results will improve 08/22/2023 2035 by Nicole Cella, RN Outcome: Progressing 08/22/2023 0652 by Nicole Cella, RN Outcome: Progressing Goal: Respiratory complications will improve 08/22/2023 2035 by Nicole Cella, RN Outcome: Progressing 08/22/2023 0652 by Nicole Cella, RN Outcome: Progressing   Problem: Activity: Goal: Risk for activity intolerance will decrease Outcome: Progressing   Problem: Nutrition: Goal: Adequate nutrition will be maintained Outcome: Progressing
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
## 2023-08-23 DIAGNOSIS — D649 Anemia, unspecified: Secondary | ICD-10-CM | POA: Diagnosis not present

## 2023-08-23 DIAGNOSIS — F02C0 Dementia in other diseases classified elsewhere, severe, without behavioral disturbance, psychotic disturbance, mood disturbance, and anxiety: Secondary | ICD-10-CM | POA: Diagnosis not present

## 2023-08-23 DIAGNOSIS — G9341 Metabolic encephalopathy: Secondary | ICD-10-CM | POA: Diagnosis not present

## 2023-08-23 DIAGNOSIS — G309 Alzheimer's disease, unspecified: Secondary | ICD-10-CM | POA: Diagnosis not present

## 2023-08-23 MED ORDER — GLYCOPYRROLATE 1 MG PO TABS: 1.0000 mg | ORAL_TABLET | ORAL | 0 refills | Status: DC | PRN

## 2023-08-23 MED ORDER — GLYCOPYRROLATE 0.2 MG/ML IJ SOLN: 0.2000 mg | INTRAMUSCULAR | 0 refills | Status: DC | PRN

## 2023-08-23 MED ORDER — HALOPERIDOL LACTATE 5 MG/ML IJ SOLN: 2.5000 mg | INTRAMUSCULAR | 0 refills | Status: DC | PRN

## 2023-08-23 MED ORDER — MORPHINE 100MG IN NS 100ML (1MG/ML) PREMIX INFUSION: 0.0000 mg/h | INTRAVENOUS | 0 refills | Status: DC

## 2023-08-23 MED ORDER — ACETAMINOPHEN 650 MG RE SUPP: 650.0000 mg | Freq: Four times a day (QID) | RECTAL | 0 refills | Status: DC | PRN

## 2023-08-23 MED ORDER — MORPHINE BOLUS VIA INFUSION: 5.0000 mg | INTRAVENOUS | 0 refills | Status: DC | PRN

## 2023-08-23 MED ORDER — POLYVINYL ALCOHOL 1.4 % OP SOLN: 1.0000 [drp] | Freq: Four times a day (QID) | OPHTHALMIC | 0 refills | Status: DC | PRN

## 2023-08-23 MED ORDER — ACETAMINOPHEN 325 MG PO TABS: 650.0000 mg | ORAL_TABLET | Freq: Four times a day (QID) | ORAL | 0 refills | Status: DC | PRN

## 2023-08-23 MED ORDER — ENSURE ENLIVE PO LIQD: 237.0000 mL | Freq: Two times a day (BID) | ORAL | 12 refills | Status: DC

## 2023-08-23 NOTE — NC FL2 (Signed)
MEDICAID [id number] LEVEL OF CARE FORM     IDENTIFICATION  Patient Name: Aaron Whitaker Birthdate: 12-Jul-1941 Sex: male Admission Date (Current Location): 08/13/2023  [facility] and [place] Number:  [employer] and Address:  [facility] [place], 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   Residual ASD (atrial septal defect) following repair 09/10/2015   Coronary artery disease involving coronary bypass graft of native heart with angina pectoris (HCC)    Cerebral thrombosis with cerebral infarction (HCC) 04/01/2015   Acute CVA (cerebrovascular accident) (HCC) 04/01/2015   Stroke (HCC)    Dyslipidemia (high LDL; low HDL)    Cerebral infarction due to embolism of left middle cerebral artery (HCC)    PFO (patent foramen ovale)    Essential hypertension    History of right MCA stroke    Aphasia 03/31/2015   CAD (coronary artery disease), native coronary artery 11/17/2011   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) 11/17/2011   Hyperlipidemia 11/17/2011   Essential hypertension, benign 11/17/2011   ASD (atrial septal defect) 11/17/2011    Orientation RESPIRATION BLADDER Height & Weight     Self  Normal Incontinent Weight: 176 lb 6.4 oz (80 kg) Height:  5' 10.5" (179.1 cm)  BEHAVIORAL SYMPTOMS/MOOD NEUROLOGICAL BOWEL NUTRITION STATUS      Incontinent Diet (Full liquid)   AMBULATORY STATUS COMMUNICATION OF NEEDS Skin   Extensive Assist Verbally PU Stage and Appropriate Care (Stage I on coccyx)                       Personal Care Assistance Level of Assistance  Bathing, Feeding, Dressing Bathing Assistance: Maximum assistance Feeding assistance: Maximum assistance Dressing Assistance: Maximum assistance     Functional Limitations Info             SPECIAL CARE FACTORS FREQUENCY                       Contractures Contractures Info: Not present    Additional Factors Info  Code Status, Allergies Code Status Info: DNR Allergies Info: NKA           Current Medications (08/23/2023):    Discharge Medications:  STOP taking these medications     amLODipine 5 MG tablet Commonly known as: NORVASC    aspirin EC 81 MG tablet    carvedilol 3.125 MG tablet Commonly known as: COREG    clopidogrel 75 MG tablet Commonly known as: PLAVIX    ferrous sulfate 325 (65 FE) MG tablet    gabapentin 100 MG capsule Commonly known as: NEURONTIN    levocetirizine 5 MG tablet Commonly known as: XYZAL    Oyster Shell 500 MG Tabs    potassium chloride SA 20 MEQ tablet Commonly known as: KLOR-CON M    PreserVision/Lutein Caps    rosuvastatin 40 MG  tablet Commonly known as: CRESTOR    SACCHAROMYCES BOULARDII PO    traMADol 50 MG tablet Commonly known as: ULTRAM    Vitamin D3 50 MCG (2000 UT) capsule           TAKE these medications     acetaminophen 325 MG tablet Commonly known as: TYLENOL Take 2 tablets (650 mg total) by mouth every 6 (six) hours as needed (Fever >/= 101). What changed:  medication strength how much to take when to take this reasons to take this    acetaminophen 650 MG suppository Commonly known as: TYLENOL Place 1 suppository (650 mg total) rectally every 6 (six) hours as needed (Fever >/= 101). What changed: You were already taking a medication with the same name, and this prescription was added.  Make sure you understand how and when to take each.    feeding supplement Liqd Take 237 mLs by mouth 2 (two) times daily between meals.    glycopyrrolate 1 MG tablet Commonly known as: ROBINUL Take 1 tablet (1 mg total) by mouth every 4 (four) hours as needed (excessive secretions).    glycopyrrolate 0.2 MG/ML injection Commonly known as: ROBINUL Inject 1 mL (0.2 mg total) into the skin every 4 (four) hours as needed (excessive secretions).    haloperidol lactate 5 MG/ML injection Commonly known as: HALDOL Inject 0.5-1 mLs (2.5-5 mg total) into the vein every 4 (four) hours as needed (or psychosis).    morphine 1 mg/mL Soln infusion Inject 0-20 mg/hr into the vein continuous.    morphine 5 mg/mL Soln Inject 5 mg into the vein every 5 (five) minutes as needed (uncontrolled pain, distress, or if respiratory rate greater than 18 in addition to titrating up infusion rate per administration instructions).    polyvinyl alcohol 1.4 % ophthalmic solution Commonly known as: LIQUIFILM TEARS Place 1 drop into both eyes 4 (four) times daily as needed for dry eyes.    Relevant Imaging Results:  Relevant Lab Results:   Additional Information SSN: [id number]. Hospice to follow at ALF.  Mearl Latin, LCSW

## 2023-08-23 NOTE — Discharge Summary (Addendum)
Name: Aaron Whitaker MRN: [id number] DOB: 27-May-1941 82 y.o. PCP: Emiliano Dyer, FNP  Date of Admission: 08/13/2023  9:57 AM Date of Discharge: 08/23/2023 11:00 AM Attending Physician: Dr. Cleda Daub  Discharge Diagnosis: Principal Problem:   Dementia (HCC) Active Problems:   Pressure injury of skin   Normocytic anemia    Discharge Medications: Allergies as of 08/23/2023   No Known Allergies      Medication List     STOP taking these medications    amLODipine 5 MG tablet Commonly known as: NORVASC   aspirin EC 81 MG tablet   carvedilol 3.125 MG tablet Commonly known as: COREG   clopidogrel 75 MG tablet Commonly known as: PLAVIX   ferrous sulfate 325 (65 FE) MG tablet   gabapentin 100 MG capsule Commonly known as: NEURONTIN   levocetirizine 5 MG tablet Commonly known as: XYZAL   Oyster Shell 500 MG Tabs   potassium chloride SA 20 MEQ tablet Commonly known as: KLOR-CON M   PreserVision/Lutein Caps   rosuvastatin 40 MG tablet Commonly known as: CRESTOR   SACCHAROMYCES BOULARDII PO   traMADol 50 MG tablet Commonly known as: ULTRAM   Vitamin D3 50 MCG (2000 UT) capsule       TAKE these medications    acetaminophen 325 MG tablet Commonly known as: TYLENOL Take 2 tablets (650 mg total) by mouth every 6 (six) hours as needed (Fever >/= 101). What changed:  medication strength how much to take when to take this reasons to take this   acetaminophen 650 MG suppository Commonly known as: TYLENOL Place 1 suppository (650 mg total) rectally every 6 (six) hours as needed (Fever >/= 101). What changed: You were already taking a medication with the same name, and this prescription was added. Make sure you understand how and when to take each.   feeding supplement Liqd Take 237 mLs by mouth 2 (two) times daily between meals.   glycopyrrolate 1 MG tablet Commonly known as: ROBINUL Take 1 tablet (1 mg total) by mouth every 4 (four)  hours as needed (excessive secretions).   glycopyrrolate 0.2 MG/ML injection Commonly known as: ROBINUL Inject 1 mL (0.2 mg total) into the skin every 4 (four) hours as needed (excessive secretions).   haloperidol lactate 5 MG/ML injection Commonly known as: HALDOL Inject 0.5-1 mLs (2.5-5 mg total) into the vein every 4 (four) hours as needed (or psychosis).   morphine 1 mg/mL Soln infusion Inject 0-20 mg/hr into the vein continuous.   morphine 5 mg/mL Soln Inject 5 mg into the vein every 5 (five) minutes as needed (uncontrolled pain, distress, or if respiratory rate greater than 18 in addition to titrating up infusion rate per administration instructions).   polyvinyl alcohol 1.4 % ophthalmic solution Commonly known as: LIQUIFILM TEARS Place 1 drop into both eyes 4 (four) times daily as needed for dry eyes.        Disposition and follow-up:   Aaron Whitaker was discharged from [facility] in [place] condition.  At the hospital follow up visit please address:  1.  Follow-up:  *End Stage Dementia  -Patient will be using Home Hospice services -Please note that patient is now in end stage dementia and family wishes to discharge to living facility with hospice services  -Please change medications as you see fit to allow patient to remain comfortable  -Patient will remain on FULL liquid diet   2.  Labs / imaging needed at time of follow-up:  N/A  3.  Pending labs/ test needing follow-up: N/A  4.  Medication Changes  STOPPED  -ALL Home medications     ADDED  -acetaminophen 325 MG tablet -acetaminophen 650 MG suppository -feeding supplement Liqd -glycopyrrolate 1 MG tablet -glycopyrrolate 0.2 MG/ML injection -haloperidol lactate 5 MG/ML injection -morphine 1 mg/mL Soln infusion -morphine 5 mg/mL Soln -polyvinyl alcohol 1.4 % ophthalmic solution  Hospital Course by problem list: End Stage Dementia  Acute metabolic encephalopathy  UTI >100,000  klebsiella pneumonia Patient presented with acute encephalopathy. Head CT did not reveal acute intracranial finds. Urine culture grew klebsiella pneumonia and he was originally started on a regimen of ceftriaxone and completed three days total of treatment. Susceptibilities returned and showed resistance to ceftriaxone. He completed 5 days of zosyn. We spoke with the son on 08/19/2023 who would like to begin the transition to comfort care. He understands that the UTI has been adequately treated without a change in patient's cognition, quality of life and that he nearing the end of his life. He will be discharged to ALF with [facility] .  Hypernatremia Sodium of 147 on admission most likely due to poor oral intake, resolved to 140 with IVF.    Elevated alk phos Alkaline phosphatase of 228 on admission, improved to 163.   Stage II decubitus ulcer Patient with stage II decubitus ulcer on admission. Stable.    AKI on CKD stage IIIb Non-anion gap metabolic acidosis Creatinine of 2.76 with GFR of 22 on admission, appeared to be very dehydrated. BUN 42, Bicarb of 19. NAGMA likely related to chronic kidney disease. Serum creatinine improved to 2.45 and 2.26 today with GFR 25-28, BUN 32-34, and Bicarb 21-22 s/p IV fluids. Resolved   Normocytic anemia Chronic. Did not require transfusion.    Discharge Subjective: Patient is sitting in bed. He appears comfortable and is slightly interactive today.   Discharge Exam:   Blood pressure 117/82, pulse 77, temperature 97.8 F (36.6 C), temperature source Oral, resp. rate 17, height 5' 10.5" (1.791 m), weight 80 kg, SpO2 100%.  Constitutional:chronically ill-appearing, appears comfortable,in no acute distress HENT: normocephalic atraumatic Cardiovascular: regular rate and rhythm Pulmonary/Chest: normal work of breathing on room air Abdominal: soft, non-tender, non-distended. Neurological: awake, confused Skin: warm and dry Psych: pleasant mood,  confused   Pertinent Labs, Studies, and Procedures:     Latest Ref Rng & Units 08/17/2023    5:13 AM 08/16/2023    5:50 AM 08/15/2023    7:09 AM  CBC  WBC 4.0 - 10.5 K/uL 5.3  5.8  6.6   Hemoglobin 13.0 - 17.0 g/dL 9.3  9.1  16.1   Hematocrit 39.0 - 52.0 % 28.6  28.0  32.8   Platelets 150 - 400 K/uL 186  214  253        Latest Ref Rng & Units 08/19/2023    7:15 AM 08/17/2023    5:13 AM 08/16/2023    5:50 AM  CMP  Glucose 70 - 99 mg/dL 86  096  045   BUN 8 - 23 mg/dL 18  24  31    Creatinine 0.61 - 1.24 mg/dL 4.09  8.11  9.14   Sodium 135 - 145 mmol/L 141  140  142   Potassium 3.5 - 5.1 mmol/L 3.2  3.2  3.3   Chloride 98 - 111 mmol/L 116  115  114   CO2 22 - 32 mmol/L 22  20  23    Calcium 8.9 - 10.3 mg/dL 7.9  7.8  8.2   Total Protein 6.5 - 8.1 g/dL  4.9    Total Bilirubin <1.2 mg/dL  0.7    Alkaline Phos 38 - 126 U/L  163    AST 15 - 41 U/L  28    ALT 0 - 44 U/L  29      CT Head Wo Contrast Result Date: 08/13/2023 CLINICAL DATA:  Provided history: Mental status change, unknown cause. EXAM: CT HEAD WITHOUT CONTRAST TECHNIQUE: Contiguous axial images were obtained from the base of the skull through the vertex without intravenous contrast. RADIATION DOSE REDUCTION: This exam was performed according to the departmental dose-optimization program which includes automated exposure control, adjustment of the mA and/or kV according to patient size and/or use of iterative reconstruction technique. COMPARISON:  Head CT 07/26/2023. FINDINGS: Brain: Generalized cerebral atrophy. Small chronic cortically-based infarcts within the posterior left frontal and right parietal lobes. Patchy and ill-defined hypoattenuation within the cerebral white matter, nonspecific but compatible with advanced chronic small vessel ischemic disease. Chronic lacunar infarcts within/about the bilateral deep gray nuclei. Chronic infarct within the left cerebral hemisphere. There is no acute intracranial hemorrhage. No  acute demarcated cortical infarct. No extra-axial fluid collection. No evidence of an intracranial mass. No midline shift. Vascular: No hyperdense vessel.  Atherosclerotic calcifications. Skull: No calvarial fracture or aggressive osseous lesion. Sinuses/Orbits: No mass or acute finding within the imaged orbits. Mild bilateral ethmoid sinusitis. IMPRESSION: 1.  No evidence of an acute intracranial abnormality. 2. 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 3. Mild bilateral ethmoid sinusitis. Electronically Signed   By: Jackey Loge D.O.   On: 08/13/2023 11:56   DG Chest Portable 1 View Result Date: 08/13/2023 CLINICAL DATA:  Altered mental status EXAM: PORTABLE CHEST 1 VIEW COMPARISON:  08/01/2023 FINDINGS: Stable elevation of the right hemidiaphragm with mild scarring or atelectasis at the right lung base. Prior CABG. Atherosclerotic calcification of the aortic arch. Loop recorder noted. ASD closure device unchanged in position. The lungs remain otherwise clear. No blunting of the costophrenic angles. IMPRESSION: 1. Stable elevation of the right hemidiaphragm with mild scarring or atelectasis at the right lung base. 2. Prior CABG. 3. Aortic Atherosclerosis (ICD10-I70.0). Electronically Signed   By: Gaylyn Rong M.D.   On: 08/13/2023 11:15     Discharge Instructions: Discharge Instructions     Discharge instructions   Complete by: As directed    You are being discharged to inpatient hospice. They will provide you with medications for comfort.   Increase activity slowly   Complete by: As directed    No wound care   Complete by: As directed        Signed: Faith Rogue DO [facility] - PGY1 Pager: [phone] 08/23/2023, 11:00 AM    Please contact the on call pager after 5 pm and on weekends at [phone].

## 2023-08-23 NOTE — TOC Progression Note (Addendum)
Transition of Care [facility]) - Progression Note    Patient Details  Name: Aaron Whitaker MRN: [id number] Date of Birth: 10-Apr-1941  [facility] CM/SW Contact  Mearl Latin, LCSW Phone Number: 08/23/2023, 9:36 AM  Clinical Narrative:    9:37 AM-CSW spoke with Geoffery Spruce at [facility] and confirmed patient will be returning there with [facility]. She stated they are waiting on hospital bed delivery. CSW sent message to [facility] to inquire. ALF requested CSW fax Fl2, DC Summary, and H&P to [phone].   10am-[facility] working on getting bed delivered.  12pm-Family has decided to [facility] [place]. CSW spoke with Cheri with [facility] and she reported they have ordered the hospital bed stat to be able to discharge patient this afternoon. CSW spoke with patient's son, Ethelene Browns who confirmed plan. CSW will arrange transport once bed arrives.   4:00 PM-CSW confirmed bed has been delivered.    Expected Discharge Plan: Assisted Living (With hospice)    Expected Discharge Plan and Services                                               Social Determinants of Health (SDOH) Interventions SDOH Screenings   Food Insecurity: Patient Unable To Answer (08/15/2023)  Housing: Patient Unable To Answer (08/15/2023)  Transportation Needs: Patient Unable To Answer (08/15/2023)  Utilities: Patient Unable To Answer (08/15/2023)  Tobacco Use: High Risk (08/18/2023)    Readmission Risk Interventions     No data to display

## 2023-08-23 NOTE — TOC Transition Note (Signed)
Transition of Care [facility]) - Discharge Note   Patient Details  Name: Aaron Whitaker MRN: [id number] Date of Birth: 11-25-1940  [facility] CM/SW Contact:  Mearl Latin, LCSW Phone Number: 08/23/2023, 4:01 PM   Clinical Narrative:    [facility] to[facility] Anticipated DC date: 08/23/23 Family notified: Son, Ethelene Browns Transport by: Sharin Mons   Per MD patient ready for DC to [facility]. RN to call report prior to discharge [phone]). RN, patient, patient's family, and facility notified of DC. Hospice to see patient in the morning. Discharge Summary and FL2 sent to facility. DC packet on chart including signed DNR and scripts. Ambulance transport requested for patient.   CSW will sign off for now as social work intervention is no longer needed. Please consult [place] again if new needs arise.     Final next level of care: Assisted Living Barriers to Discharge: Barriers Resolved   Patient Goals and CMS Choice   CMS Medicare.gov Compare Post Acute Care list provided to:: Patient Represenative (must comment) Choice offered to / list presented to : Adult Children      Discharge Placement              Patient chooses bed at:  [facility]) Patient to be transferred to facility by: [facility] Name of family member notified: Son Patient and family notified of of transfer: 08/23/23  Discharge Plan and Services Additional resources added to the After Visit Summary for                              [facility] Agency: [facility]     Representative spoke with at [facility] Agency: Cheri  Social Drivers of Health (SDOH) Interventions SDOH Screenings   Food Insecurity: Patient Unable To Answer (08/15/2023)  Housing: Patient Unable To Answer (08/15/2023)  Transportation Needs: Patient Unable To Answer (08/15/2023)  Utilities: Patient Unable To Answer (08/15/2023)  Tobacco Use: High Risk (08/18/2023)     Readmission Risk  Interventions     No data to display

## 2023-08-23 NOTE — Progress Notes (Signed)
Patient was discharged from the unit via [facility].

## 2023-08-24 DIAGNOSIS — Z872 Personal history of diseases of the skin and subcutaneous tissue: Secondary | ICD-10-CM | POA: Diagnosis not present

## 2023-08-24 DIAGNOSIS — Z8546 Personal history of malignant neoplasm of prostate: Secondary | ICD-10-CM | POA: Diagnosis not present

## 2023-08-24 DIAGNOSIS — I639 Cerebral infarction, unspecified: Secondary | ICD-10-CM | POA: Diagnosis not present

## 2023-08-24 DIAGNOSIS — S069X9S Unspecified intracranial injury with loss of consciousness of unspecified duration, sequela: Secondary | ICD-10-CM | POA: Diagnosis not present

## 2023-08-24 DIAGNOSIS — R1310 Dysphagia, unspecified: Secondary | ICD-10-CM | POA: Diagnosis not present

## 2023-08-24 DIAGNOSIS — G20C Parkinsonism, unspecified: Secondary | ICD-10-CM | POA: Diagnosis not present

## 2023-08-24 DIAGNOSIS — K5190 Ulcerative colitis, unspecified, without complications: Secondary | ICD-10-CM | POA: Diagnosis not present

## 2023-08-24 DIAGNOSIS — F0280 Dementia in other diseases classified elsewhere without behavioral disturbance: Secondary | ICD-10-CM | POA: Diagnosis not present

## 2023-08-24 DIAGNOSIS — I69342 Monoplegia of lower limb following cerebral infarction affecting left dominant side: Secondary | ICD-10-CM | POA: Diagnosis not present

## 2023-08-24 DIAGNOSIS — F0150 Vascular dementia without behavioral disturbance: Secondary | ICD-10-CM | POA: Diagnosis not present

## 2023-08-24 DIAGNOSIS — I10 Essential (primary) hypertension: Secondary | ICD-10-CM | POA: Diagnosis not present

## 2023-08-24 DIAGNOSIS — I69322 Dysarthria following cerebral infarction: Secondary | ICD-10-CM | POA: Diagnosis not present

## 2023-08-24 DIAGNOSIS — Z87440 Personal history of urinary (tract) infections: Secondary | ICD-10-CM | POA: Diagnosis not present

## 2023-08-24 DIAGNOSIS — Z9049 Acquired absence of other specified parts of digestive tract: Secondary | ICD-10-CM | POA: Diagnosis not present

## 2023-08-26 DIAGNOSIS — I69342 Monoplegia of lower limb following cerebral infarction affecting left dominant side: Secondary | ICD-10-CM | POA: Diagnosis not present

## 2023-08-26 DIAGNOSIS — S069X9S Unspecified intracranial injury with loss of consciousness of unspecified duration, sequela: Secondary | ICD-10-CM | POA: Diagnosis not present

## 2023-08-26 DIAGNOSIS — F0280 Dementia in other diseases classified elsewhere without behavioral disturbance: Secondary | ICD-10-CM | POA: Diagnosis not present

## 2023-08-26 DIAGNOSIS — F0150 Vascular dementia without behavioral disturbance: Secondary | ICD-10-CM | POA: Diagnosis not present

## 2023-08-26 DIAGNOSIS — G20C Parkinsonism, unspecified: Secondary | ICD-10-CM | POA: Diagnosis not present

## 2023-08-26 DIAGNOSIS — R1310 Dysphagia, unspecified: Secondary | ICD-10-CM | POA: Diagnosis not present

## 2023-08-30 DIAGNOSIS — F0280 Dementia in other diseases classified elsewhere without behavioral disturbance: Secondary | ICD-10-CM | POA: Diagnosis not present

## 2023-08-30 DIAGNOSIS — I69342 Monoplegia of lower limb following cerebral infarction affecting left dominant side: Secondary | ICD-10-CM | POA: Diagnosis not present

## 2023-08-30 DIAGNOSIS — S069X9S Unspecified intracranial injury with loss of consciousness of unspecified duration, sequela: Secondary | ICD-10-CM | POA: Diagnosis not present

## 2023-08-30 DIAGNOSIS — F0150 Vascular dementia without behavioral disturbance: Secondary | ICD-10-CM | POA: Diagnosis not present

## 2023-08-30 DIAGNOSIS — R1310 Dysphagia, unspecified: Secondary | ICD-10-CM | POA: Diagnosis not present

## 2023-08-30 DIAGNOSIS — G20C Parkinsonism, unspecified: Secondary | ICD-10-CM | POA: Diagnosis not present

## 2023-08-31 DIAGNOSIS — S069X9S Unspecified intracranial injury with loss of consciousness of unspecified duration, sequela: Secondary | ICD-10-CM | POA: Diagnosis not present

## 2023-08-31 DIAGNOSIS — F0150 Vascular dementia without behavioral disturbance: Secondary | ICD-10-CM | POA: Diagnosis not present

## 2023-08-31 DIAGNOSIS — I69342 Monoplegia of lower limb following cerebral infarction affecting left dominant side: Secondary | ICD-10-CM | POA: Diagnosis not present

## 2023-08-31 DIAGNOSIS — G20C Parkinsonism, unspecified: Secondary | ICD-10-CM | POA: Diagnosis not present

## 2023-08-31 DIAGNOSIS — F0280 Dementia in other diseases classified elsewhere without behavioral disturbance: Secondary | ICD-10-CM | POA: Diagnosis not present

## 2023-08-31 DIAGNOSIS — R1310 Dysphagia, unspecified: Secondary | ICD-10-CM | POA: Diagnosis not present

## 2023-11-30 DEATH — deceased
